# Patient Record
Sex: Male | Born: 1955
Health system: Southern US, Community
[De-identification: ages and names within clinical notes are randomized; demographics above are authoritative.]

## PROBLEM LIST (undated history)

## (undated) DIAGNOSIS — E119 Type 2 diabetes mellitus without complications: Secondary | ICD-10-CM

## (undated) DIAGNOSIS — E785 Hyperlipidemia, unspecified: Secondary | ICD-10-CM

## (undated) DIAGNOSIS — I639 Cerebral infarction, unspecified: Secondary | ICD-10-CM

## (undated) HISTORY — DX: Hyperlipidemia, unspecified: E78.5

---

## 2007-12-19 ENCOUNTER — Ambulatory Visit: Payer: Self-pay | Admitting: Gastroenterology

## 2014-10-15 ENCOUNTER — Emergency Department: Payer: Self-pay | Admitting: Emergency Medicine

## 2014-10-15 ENCOUNTER — Encounter (HOSPITAL_COMMUNITY): Payer: Self-pay

## 2014-10-15 ENCOUNTER — Inpatient Hospital Stay (HOSPITAL_COMMUNITY): Payer: BC Managed Care – PPO

## 2014-10-15 ENCOUNTER — Inpatient Hospital Stay (HOSPITAL_COMMUNITY)
Admission: EM | Admit: 2014-10-15 | Discharge: 2014-10-18 | DRG: 064 | Disposition: A | Discharge status: Home / Self Care | Payer: BC Managed Care – PPO | Attending: Internal Medicine | Admitting: Internal Medicine

## 2014-10-15 DIAGNOSIS — I618 Other nontraumatic intracerebral hemorrhage: Secondary | ICD-10-CM | POA: Diagnosis present

## 2014-10-15 DIAGNOSIS — I69391 Dysphagia following cerebral infarction: Secondary | ICD-10-CM | POA: Diagnosis not present

## 2014-10-15 DIAGNOSIS — IMO0002 Reserved for concepts with insufficient information to code with codable children: Secondary | ICD-10-CM | POA: Diagnosis present

## 2014-10-15 DIAGNOSIS — R4789 Other speech disturbances: Secondary | ICD-10-CM | POA: Diagnosis present

## 2014-10-15 DIAGNOSIS — Z833 Family history of diabetes mellitus: Secondary | ICD-10-CM

## 2014-10-15 DIAGNOSIS — Z8249 Family history of ischemic heart disease and other diseases of the circulatory system: Secondary | ICD-10-CM

## 2014-10-15 DIAGNOSIS — Z794 Long term (current) use of insulin: Secondary | ICD-10-CM

## 2014-10-15 DIAGNOSIS — I679 Cerebrovascular disease, unspecified: Secondary | ICD-10-CM | POA: Insufficient documentation

## 2014-10-15 DIAGNOSIS — Z880 Allergy status to penicillin: Secondary | ICD-10-CM | POA: Diagnosis not present

## 2014-10-15 DIAGNOSIS — I639 Cerebral infarction, unspecified: Secondary | ICD-10-CM | POA: Diagnosis present

## 2014-10-15 DIAGNOSIS — E785 Hyperlipidemia, unspecified: Secondary | ICD-10-CM | POA: Diagnosis present

## 2014-10-15 DIAGNOSIS — F1290 Cannabis use, unspecified, uncomplicated: Secondary | ICD-10-CM | POA: Diagnosis present

## 2014-10-15 DIAGNOSIS — I63412 Cerebral infarction due to embolism of left middle cerebral artery: Secondary | ICD-10-CM | POA: Diagnosis present

## 2014-10-15 DIAGNOSIS — I69320 Aphasia following cerebral infarction: Secondary | ICD-10-CM

## 2014-10-15 DIAGNOSIS — I69392 Facial weakness following cerebral infarction: Secondary | ICD-10-CM | POA: Diagnosis not present

## 2014-10-15 DIAGNOSIS — E1165 Type 2 diabetes mellitus with hyperglycemia: Secondary | ICD-10-CM | POA: Diagnosis present

## 2014-10-15 DIAGNOSIS — R1312 Dysphagia, oropharyngeal phase: Secondary | ICD-10-CM | POA: Diagnosis present

## 2014-10-15 HISTORY — DX: Cerebral infarction, unspecified: I63.9

## 2014-10-15 HISTORY — DX: Type 2 diabetes mellitus without complications: E11.9

## 2014-10-15 LAB — COMPREHENSIVE METABOLIC PANEL
ALBUMIN: 4 g/dL (ref 3.4–5.0)
ALK PHOS: 87 U/L
ALT: 21 U/L (ref 0–53)
AST: 27 U/L (ref 15–37)
AST: 21 U/L (ref 0–37)
Albumin: 3.8 g/dL (ref 3.5–5.2)
Alkaline Phosphatase: 71 U/L (ref 39–117)
Anion Gap: 11 (ref 7–16)
Anion gap: 13 (ref 5–15)
BILIRUBIN TOTAL: 0.5 mg/dL (ref 0.3–1.2)
BUN: 13 mg/dL (ref 7–18)
BUN: 12 mg/dL (ref 6–23)
Bilirubin,Total: 0.5 mg/dL (ref 0.2–1.0)
CHLORIDE: 104 mmol/L (ref 98–107)
CO2: 23 meq/L (ref 19–32)
Calcium, Total: 8.6 mg/dL (ref 8.5–10.1)
Calcium: 8.9 mg/dL (ref 8.4–10.5)
Chloride: 105 mEq/L (ref 96–112)
Co2: 28 mmol/L (ref 21–32)
Creatinine, Ser: 0.84 mg/dL (ref 0.50–1.35)
Creatinine: 1.12 mg/dL (ref 0.60–1.30)
EGFR (African American): >60
EGFR (Non-African Amer.): >60
GFR calc Af Amer: >90 mL/min (ref >90)
GLUCOSE: 225 mg/dL — AB (ref 65–99)
GLUCOSE: 174 mg/dL — AB (ref 70–99)
Osmolality: 292 (ref 275–301)
POTASSIUM: 4.5 mmol/L (ref 3.5–5.1)
Potassium: 4.2 mEq/L (ref 3.7–5.3)
SGPT (ALT): 34 U/L
SODIUM: 141 meq/L (ref 137–147)
Sodium: 143 mmol/L (ref 136–145)
Total Protein: 7.9 g/dL (ref 6.4–8.2)
Total Protein: 7.1 g/dL (ref 6.0–8.3)

## 2014-10-15 LAB — CBC WITH DIFFERENTIAL/PLATELET
BASOS ABS: 0 10*3/uL (ref 0.0–0.1)
BASOS PCT: 0.5 %
BASOS PCT: 1 % (ref 0–1)
Basophils Absolute: 0.1 10*3/uL (ref 0.0–0.1)
EOS ABS: 0 10*3/uL (ref 0.0–0.7)
EOS PCT: 1 % (ref 0–5)
Eosinophil %: 0.3 %
Eosinophils Absolute: 0.1 10*3/uL (ref 0.0–0.7)
HCT: 50 % (ref 40.0–52.0)
HCT: 44.8 % (ref 39.0–52.0)
HGB: 16.4 g/dL (ref 13.0–18.0)
Hemoglobin: 15.5 g/dL (ref 13.0–17.0)
LYMPHS ABS: 1.7 10*3/uL (ref 0.7–4.0)
LYMPHS PCT: 13 %
Lymphocyte #: 1.1 10*3/uL (ref 1.0–3.6)
Lymphocytes Relative: 20 % (ref 12–46)
MCH: 30.3 pg (ref 26.0–34.0)
MCH: 30.3 pg (ref 26.0–34.0)
MCHC: 32.8 g/dL (ref 32.0–36.0)
MCHC: 34.6 g/dL (ref 30.0–36.0)
MCV: 93 fL (ref 80–100)
MCV: 87.7 fL (ref 78.0–100.0)
MONO ABS: 0.7 x10 3/mm (ref 0.2–1.0)
MONOS PCT: 8 %
Monocytes Absolute: 0.8 10*3/uL (ref 0.1–1.0)
Monocytes Relative: 10 % (ref 3–12)
NEUTROS ABS: 6.6 10*3/uL — AB (ref 1.4–6.5)
Neutro Abs: 6 10*3/uL (ref 1.7–7.7)
Neutrophil %: 78.2 %
Neutrophils Relative %: 68 % (ref 43–77)
PLATELETS: 249 10*3/uL (ref 150–440)
PLATELETS: 243 10*3/uL (ref 150–400)
RBC: 5.4 10*6/uL (ref 4.40–5.90)
RBC: 5.11 MIL/uL (ref 4.22–5.81)
RDW: 13.2 % (ref 11.5–14.5)
RDW: 12.8 % (ref 11.5–15.5)
WBC: 8.5 10*3/uL (ref 3.8–10.6)
WBC: 8.6 10*3/uL (ref 4.0–10.5)

## 2014-10-15 LAB — GLUCOSE, CAPILLARY: Glucose-Capillary: 176 mg/dL — ABNORMAL HIGH (ref 70–99)

## 2014-10-15 LAB — LIPID PANEL
Cholesterol: 208 mg/dL — ABNORMAL HIGH (ref 0–200)
HDL: 83 mg/dL (ref >39)
LDL CALC: 115 mg/dL — AB (ref 0–99)
TRIGLYCERIDES: 52 mg/dL (ref <150)
Total CHOL/HDL Ratio: 2.5 RATIO
VLDL: 10 mg/dL (ref 0–40)

## 2014-10-15 LAB — APTT: Activated PTT: 26.1 secs (ref 23.6–35.9)

## 2014-10-15 LAB — TROPONIN I: Troponin-I: <0.02 ng/mL

## 2014-10-15 LAB — PROTIME-INR
INR: 0.9
Prothrombin Time: 12.1 secs (ref 11.5–14.7)

## 2014-10-15 LAB — TSH: TSH: 6.21 u[IU]/mL — AB (ref 0.350–4.500)

## 2014-10-15 NOTE — ED Notes (Signed)
Dr. Kakrakandy at bedside. 

## 2014-10-15 NOTE — ED Notes (Signed)
Attempted to call report

## 2014-10-15 NOTE — ED Notes (Signed)
Family updated on pt plan of care, Dr. Jeraldine LootsLockwood to see pt and start working on admission orders.

## 2014-10-15 NOTE — H&P (Signed)
[facility] History and Physical  Maxwell Clark ZOX:[id number] DOB: 05/24/1956 DOA: 10/15/2014  Referring physician: ER physician. PCP: No primary care provider on file.   Chief Complaint: Difficulty speaking and right facial droop.  HPI: Maxwell Clark is a 58 y.o. male with history of diabetes mellitus type 2 on insulin started experiencing some difficulty speaking since morning. As the day progressed patient's symptoms got worse and by evening patient was taken to [facility] over there CT head done showed evolving stroke in the left parietal region and patient was transferred [facility] for further management. On-call neurologist Dr. Noel Christmasharles Stewart was consulted at this time neurologist felt that patient was not a candidate for TPA. On exam patient is having right facial droop and difficulty to speak. Patient has expressive aphasia. Patient's tongue is deviated to the right. Patient otherwise able to move all extremities 5 x 5. Denies any chest pain shortness of breath headache visual symptoms. Patient states his blood sugar has been running well. Labs done at [facility] was largely unremarkable except the blood sugar was 225.   Review of Systems: As presented in the history of presenting illness, rest negative.  Past Medical History  Diagnosis Date  . Diabetes mellitus without complication    History reviewed. No pertinent past surgical history. Social History:  reports that he has never smoked. He does not have any smokeless tobacco history on file. He reports that he drinks alcohol. He reports that he does not use illicit drugs. Where does patient live home. Can patient participate in ADLs? Yes.  Allergies  Allergen Reactions  . Penicillins     unknown    Family History:  Family History  Problem Relation Age of Onset  . Atrial fibrillation Mother   . Hypertension Mother   . Lung cancer Father   . Kidney cancer  Father       Prior to Admission medications   Medication Sig Start Date End Date Taking? Authorizing Provider  insulin glargine (LANTUS) 100 UNIT/ML injection Inject 20 Units into the skin at bedtime.   Yes Historical Provider, MD    Physical Exam: Filed Vitals:   10/15/14 2045 10/15/14 2112 10/15/14 2115 10/15/14 2245  BP: 116/74 126/64 133/63 142/77  Pulse: 66 66 66 67  Temp:    98.1 F (36.7 C)  Resp: 13 15 15 17   SpO2: 96% 98% 98% 98%     General:  Well-developed and nourished.  Eyes: Anicteric no pallor.  ENT: No discharge from the ears eyes nose and mouth. Right facial droop.  Neck: No mass felt. No neck rigidity.  Cardiovascular: S1 and S2 heard.  Respiratory: No rhonchi or crepitations.  Abdomen: Soft nontender bowel sounds present.  Skin: No rash.  Musculoskeletal: No edema.  Psychiatric: Appears normal.  Neurologic: Alert awake oriented to time place and person. Moves all extremities 5 x 5. Right facial droop. Tongue is deviated to the right. Perla positive.  Labs on Admission:  Basic Metabolic Panel: No results for input(s): NA, K, CL, CO2, GLUCOSE, BUN, CREATININE, CALCIUM, MG, PHOS in the last 168 hours. Liver Function Tests: No results for input(s): AST, ALT, ALKPHOS, BILITOT, PROT, ALBUMIN in the last 168 hours. No results for input(s): LIPASE, AMYLASE in the last 168 hours. No results for input(s): AMMONIA in the last 168 hours. CBC:  Recent Labs Lab 10/15/14 2226  WBC 8.6  NEUTROABS 6.0  HGB 15.5  HCT 44.8  MCV 87.7  PLT 243   Cardiac Enzymes: No results for input(s): CKTOTAL, CKMB, CKMBINDEX, TROPONINI in the last 168 hours.  BNP (last 3 results) No results for input(s): PROBNP in the last 8760 hours. CBG: No results for input(s): GLUCAP in the last 168 hours.  Radiological Exams on Admission: No results found.  EKG: Independently reviewed. Normal sinus rhythm.  Assessment/Plan Principal Problem:   Acute ischemic  stroke Active Problems:   Aphasia due to stroke   Stroke   Diabetes mellitus type 2, uncontrolled   1. Acute ischemic stroke - at this time neurologist Dr. Noel Christmasharles Stewart as recommended to get MRI/MRA brain 2-D echo and carotid Doppler. Patient will be placed on neuro checks. Patient has failed swallow evaluation. Get speech therapist evaluation. Aspirin. Closely monitor in telemetry. 2. Diabetes mellitus type 2 - since patient has failed swallow I have decrease patient's Lantus dose by half now. Closely follow CBGs by sliding scale. Check hemoglobin A1c and lipid panel.    Code Status: Full code.  Family Communication: Patient's wife at the bedside.  Disposition Plan: Admit to inpatient.    KAKRAKANDY,ARSHAD N. [facility] Pager [phone].  If 7PM-7AM, please contact night-coverage [email or web address] Password [email or web address] 10/15/2014, 11:13 PM

## 2014-10-15 NOTE — ED Notes (Signed)
[facility]- Pt coming from [facility], he was first noted to have trouble talking around 0700 this morning. Pt then proceeded to go to work and continued to worsen. Pt diagnosed with left parietal lobe infarct at [facility]. Pt has right side facial droop, moves all extremities equally. Is having expressive difficulties at this time. NAD noted.

## 2014-10-15 NOTE — ED Notes (Signed)
Dr Stewart at bedside.

## 2014-10-15 NOTE — Consult Note (Signed)
Referring Physician: Dr. Jeraldine LootsLockwood  Chief Complaint: New onset expressive aphasia.  HPI: Maxwell Clark is an 58 y.o. male history of diabetes this noticed his inability to speak 7 AM this morning. It's unclear patient woke up with speech deficit. He was noted to be mute this afternoon he was beyond time window for and was brought to the emergency room for further evaluation and [facility]. CT scan of his mute his head showed an area of likely evolving stroke involving the left frontal region. Patient had no difficulty with understanding what was being said to him and following commands, but was mute. Patient was past the time window for treatment with TPA. NIH stroke scale was 9.  LSN: 7 AM on 10/15/2014 tPA Given: No: Beyond time window for treatment mRankin:  Past Medical History  Diagnosis Date  . Diabetes mellitus without complication     Family history: Positive for diabetes mellitus; negative for stroke and hypertension.  Medications: I have reviewed the patient's current medications.  ROS: History obtained from the patient  General ROS: negative for - chills, fatigue, fever, night sweats, weight gain or weight loss Psychological ROS: negative for - behavioral disorder, hallucinations, memory difficulties, mood swings or suicidal ideation Ophthalmic ROS: negative for - blurry vision, double vision, eye pain or loss of vision ENT ROS: negative for - epistaxis, nasal discharge, oral lesions, sore throat, tinnitus or vertigo Allergy and Immunology ROS: negative for - hives or itchy/watery eyes Hematological and Lymphatic ROS: negative for - bleeding problems, bruising or swollen lymph nodes Endocrine ROS: negative for - galactorrhea, hair pattern changes, polydipsia/polyuria or temperature intolerance Respiratory ROS: negative for - cough, hemoptysis, shortness of breath or wheezing Cardiovascular ROS: negative for - chest pain, dyspnea on exertion, edema or irregular  heartbeat Gastrointestinal ROS: negative for - abdominal pain, diarrhea, hematemesis, nausea/vomiting or stool incontinence Genito-Urinary ROS: negative for - dysuria, hematuria, incontinence or urinary frequency/urgency Musculoskeletal ROS: negative for - joint swelling or muscular weakness Neurological ROS: as noted in HPI Dermatological ROS: negative for rash and skin lesion changes  Physical Examination: Blood pressure 126/64, pulse 66, resp. rate 15, SpO2 98 %.   HEENT: Normal. Neck was supple.  Chest was clear to auscultation Heart rate and rhythm with normal; normal S1 and S2; no murmur. Abdomen was normal.  Neurologic Examination: Mental Status: Alert, totally mute, in no acute distress. Able to follow commands without difficulty. Cranial Nerves: II-Visual fields were normal. III/IV/VI-Pupils were equal and reacted. Extraocular movements were full and conjugate.    V/VII-no facial numbness; moderate right lower facial weakness. VIII-normal. X-mute. Motor: 5/5 bilaterally with normal tone and bulk Sensory: Normal throughout. Deep Tendon Reflexes: 1+ and symmetric. Plantars: Flexor bilaterally Cerebellar: Normal finger-to-nose testing. Carotid auscultation: Normal  No results found.  Assessment: 58 y.o. male with a history of diabetes mellitus presenting with acute left frontal ischemic infarction with associated severe expressive aphasia.  Stroke Risk Factors - diabetes mellitus  Plan: 1. HgbA1c, fasting lipid panel 2. MRI, MRA  of the brain without contrast 3. PT consult, OT consult, Speech consult 4. Echocardiogram 5. Carotid dopplers 6. Prophylactic therapy-Antiplatelet med: Aspirin  7. Risk factor modification 8. Telemetry monitoring   C.R. Roseanne RenoStewart, MD [facility] [phone]  10/15/2014, 9:30 PM

## 2014-10-16 ENCOUNTER — Inpatient Hospital Stay (HOSPITAL_COMMUNITY): Payer: BC Managed Care – PPO

## 2014-10-16 ENCOUNTER — Encounter (HOSPITAL_COMMUNITY): Payer: Self-pay | Admitting: General Practice

## 2014-10-16 DIAGNOSIS — I519 Heart disease, unspecified: Secondary | ICD-10-CM

## 2014-10-16 LAB — GLUCOSE, CAPILLARY
GLUCOSE-CAPILLARY: 167 mg/dL — AB (ref 70–99)
Glucose-Capillary: 100 mg/dL — ABNORMAL HIGH (ref 70–99)
Glucose-Capillary: 107 mg/dL — ABNORMAL HIGH (ref 70–99)
Glucose-Capillary: 135 mg/dL — ABNORMAL HIGH (ref 70–99)
Glucose-Capillary: 161 mg/dL — ABNORMAL HIGH (ref 70–99)
Glucose-Capillary: 98 mg/dL (ref 70–99)

## 2014-10-16 LAB — URINALYSIS, ROUTINE W REFLEX MICROSCOPIC
BILIRUBIN URINE: NEGATIVE
Glucose, UA: >1000 mg/dL — AB
Hgb urine dipstick: NEGATIVE
KETONES UR: 15 mg/dL — AB
Leukocytes, UA: NEGATIVE
NITRITE: NEGATIVE
Protein, ur: NEGATIVE mg/dL
Specific Gravity, Urine: 1.036 — ABNORMAL HIGH (ref 1.005–1.030)
Urobilinogen, UA: 0.2 mg/dL (ref 0.0–1.0)
pH: 6 (ref 5.0–8.0)

## 2014-10-16 LAB — HEMOGLOBIN A1C
Hgb A1c MFr Bld: 8.1 % — ABNORMAL HIGH (ref <5.7)
Mean Plasma Glucose: 186 mg/dL — ABNORMAL HIGH (ref <117)

## 2014-10-16 LAB — RAPID URINE DRUG SCREEN, HOSP PERFORMED
Amphetamines: NOT DETECTED
BARBITURATES: NOT DETECTED
Benzodiazepines: NOT DETECTED
COCAINE: NOT DETECTED
Opiates: NOT DETECTED
Tetrahydrocannabinol: POSITIVE — AB

## 2014-10-16 LAB — ANTITHROMBIN III: ANTITHROMB III FUNC: 114 % (ref 75–120)

## 2014-10-16 LAB — URINE MICROSCOPIC-ADD ON

## 2014-10-16 MED ORDER — SENNOSIDES-DOCUSATE SODIUM 8.6-50 MG PO TABS: 1.0000 | ORAL_TABLET | Freq: Every evening | ORAL | Status: DC | PRN

## 2014-10-16 MED ORDER — ASPIRIN 325 MG PO TABS
325.0000 mg | ORAL_TABLET | Freq: Every day | ORAL | Status: DC
Start: 2014-10-16 — End: 2014-10-16

## 2014-10-16 MED ORDER — SODIUM CHLORIDE 0.9 % IV SOLN
INTRAVENOUS | Status: AC
  Administered 2014-10-16: 02:00:00 via INTRAVENOUS

## 2014-10-16 MED ORDER — ATORVASTATIN CALCIUM 80 MG PO TABS
80.0000 mg | ORAL_TABLET | Freq: Every day | ORAL | Status: DC
Start: 2014-10-16 — End: 2014-10-18
  Administered 2014-10-16 – 2014-10-18 (×3): 80 mg via ORAL
  Filled 2014-10-16 (×3): qty 1

## 2014-10-16 MED ORDER — INSULIN GLARGINE 100 UNIT/ML ~~LOC~~ SOLN
10.0000 [IU] | Freq: Every day | SUBCUTANEOUS | Status: DC
  Administered 2014-10-16 (×2): 10 [IU] via SUBCUTANEOUS
  Administered 2014-10-17: 5 [IU] via SUBCUTANEOUS
  Filled 2014-10-16 (×4): qty 0.1

## 2014-10-16 MED ORDER — ATORVASTATIN CALCIUM 20 MG PO TABS
20.0000 mg | ORAL_TABLET | Freq: Every day | ORAL | Status: DC
Start: 2014-10-16 — End: 2014-10-16

## 2014-10-16 MED ORDER — CETYLPYRIDINIUM CHLORIDE 0.05 % MT LIQD
7.0000 mL | Freq: Two times a day (BID) | OROMUCOSAL | Status: DC
  Administered 2014-10-16: 7 mL via OROMUCOSAL

## 2014-10-16 MED ORDER — ENOXAPARIN SODIUM 40 MG/0.4ML ~~LOC~~ SOLN
40.0000 mg | SUBCUTANEOUS | Status: DC
  Administered 2014-10-16 – 2014-10-17 (×2): 40 mg via SUBCUTANEOUS
  Filled 2014-10-16 (×2): qty 0.4

## 2014-10-16 MED ORDER — STROKE: EARLY STAGES OF RECOVERY BOOK
Freq: Once | Status: AC
  Administered 2014-10-16: 02:00:00
  Filled 2014-10-16: qty 1

## 2014-10-16 MED ORDER — INSULIN ASPART 100 UNIT/ML ~~LOC~~ SOLN
0.0000 [IU] | SUBCUTANEOUS | Status: DC
  Administered 2014-10-16: 2 [IU] via SUBCUTANEOUS
  Administered 2014-10-16: 1 [IU] via SUBCUTANEOUS
  Administered 2014-10-16 – 2014-10-17 (×3): 2 [IU] via SUBCUTANEOUS
  Administered 2014-10-18: 1 [IU] via SUBCUTANEOUS

## 2014-10-16 MED ORDER — ASPIRIN 300 MG RE SUPP: 300.0000 mg | Freq: Every day | RECTAL | Status: DC

## 2014-10-16 NOTE — Evaluation (Signed)
Physical Therapy Evaluation Patient Details Name: Maxwell Clark MRN: [id number] DOB: [birth year] Today's Date: 10/16/2014   History of Present Illness    Maxwell Clark is a 58 y.o. male with history of diabetes mellitus type 2 on insulin started experiencing some difficulty speaking since morning. As the day progressed patient's symptoms got worse and by evening patient was taken to [facility] over there CT head done showed evolving stroke in the left parietal region and patient was transferred to [facility] for further management. On-call neurologist Dr. Noel Christmasharles Stewart was consulted at this time neurologist felt that patient was not a candidate for TPA. On exam patient is having right facial droop and difficulty to speak. Patient has expressive aphasia. Patient's tongue is deviated to the right. MRI pending.  Clinical Impression   Pt is reported to be at baseline mobility level by patient and family. Pt was reluctant to work with physical therapy as he is tired and had a busy morning with tests. Pt with expressive difficulties but able to respond reliably to yes/no questions. Pt demonstrated good, safe bed mobility, transfers and ambulation. Pt is quick to move which family stated is baseline for pt, in combination with pt wanting to get session over to relax before next test. Despite being quick to move, pt was never unsafe. Pt will benefit from outpatient speech and an outpatient OT consult to assess higher cognition and RUE fine motor dexterity. Pt is safe and at baseline from a pt standpoint and will not require further acute PT or PT after discharge. No goals will be written at this time.     Follow Up Recommendations No PT follow up    Equipment Recommendations  None recommended by PT    Recommendations for Other Services       Precautions / Restrictions Precautions Precautions: Fall Restrictions Weight Bearing Restrictions: No      Mobility  Bed  Mobility Overal bed mobility: Modified Independent             General bed mobility comments: Pt abel to sit up with HOB elevated. Pt was quick to sit up so unable to lowered bed to see bed mobility with flat bed.   Transfers Overall transfer level: Needs assistance Equipment used: None Transfers: Sit to/from Stand Sit to Stand: Supervision         General transfer comment: Pt able to stand safely from sitting EOB without use of UE.   Ambulation/Gait Ambulation/Gait assistance: Supervision Ambulation Distance (Feet): 100 Feet Assistive device: None Gait Pattern/deviations: Step-through pattern   Gait velocity interpretation: at or above normal speed for age/gender General Gait Details: Pt able to safely ambulate in hallway with near normal speed for age amd gender. Pt had no episodes of loss of balance and was able to attend to objects on both side.   Stairs            Wheelchair Mobility    Modified Rankin (Stroke Patients Only)       Balance Overall balance assessment: Needs assistance Sitting-balance support: Feet supported;No upper extremity supported Sitting balance-Leahy Scale: Good     Standing balance support: During functional activity;No upper extremity supported Standing balance-Leahy Scale: Good Standing balance comment: Pt able to ambulate in hallway safely with no AD.                              Pertinent Vitals/Pain Pain Assessment: No/denies pain  VSS    Home Living Family/patient expects to be discharged to:: Private residence Living Arrangements: Spouse/significant other Available Help at Discharge: Family Type of Home: House Home Access: Level entry     Home Layout: Two level;Able to live on main level with bedroom/bathroom Home Equipment: None      Prior Function Level of Independence: Independent               Hand Dominance   Dominant Hand: Right    Extremity/Trunk Assessment                Lower Extremity Assessment: Overall WFL for tasks assessed         Communication   Communication: Expressive difficulties  Cognition Arousal/Alertness: Awake/alert Behavior During Therapy: WFL for tasks assessed/performed Overall Cognitive Status: Within Functional Limits for tasks assessed                      General Comments General comments (skin integrity, edema, etc.): Pt able to appropriately answer yes/no questions. Pt quick and a little implusive with movement but pt's family states that this is his baseline. Pt with no reported visual deficits.     Exercises        Assessment/Plan    PT Assessment Patent does not need any further PT services  PT Diagnosis Difficulty walking   PT Problem List    PT Treatment Interventions     PT Goals (Current goals can be found in the Care Plan section)      Frequency     Barriers to discharge        Co-evaluation               End of Session Equipment Utilized During Treatment: Gait belt Activity Tolerance: Patient tolerated treatment well Patient left: in bed;with call bell/phone within reach;with family/visitor present           Time: [id number] PT Time Calculation (min) (ACUTE ONLY): 15 min   Charges:   PT Evaluation $Initial PT Evaluation Tier I: 1 Procedure     PT G CodesBerline Lopes:          [name] 10/16/2014, 12:49 PM   York SpanielOlivia Hebert, SPT  Acute Rehabilitation [phone] [facility] Jonie Burdell,PT Acute Rehabilitation [phone] (pager)

## 2014-10-16 NOTE — Plan of Care (Signed)
Problem: SLP Language Goals Goal: Patient will communicate needs/wants with Outcome: Progressing     

## 2014-10-16 NOTE — Progress Notes (Signed)
Bilateral carotid artery duplex completed:  1-39% ICA stenosis.  Vertebral artery flow is antegrade.     

## 2014-10-16 NOTE — Progress Notes (Signed)
*  PRELIMINARY RESULTS* Echocardiogram 2D Echocardiogram has been performed.  Jeryl ColumbiaLLIOTT, Glorie Dowlen 10/16/2014, 10:40 AM

## 2014-10-16 NOTE — ED Provider Notes (Signed)
CSN: [id number]     Arrival date & time 10/15/14  1840 History   First MD Initiated Contact with Patient 10/15/14 1848     Chief Complaint  Patient presents with  . Cerebrovascular Accident     HPI  Patient presents on transfer from one of our affiliated facilities due to concern for stroke. I see normal time was approximately 12 hours prior to my evaluation. Symptoms consist of expressive aphasia, initially with facial droop. Subsequent, the facial droop resolved, but the aphasia persists. The patient nods in response to questions. He currently denies pain, confusion, disorientation. Patient was generally well prior to the episode.  Patient was transferred here after physicians at the other facility discussed this case with neurology, and thought that he may be a candidate for intervention.   Past Medical History  Diagnosis Date  . Diabetes mellitus without complication    History reviewed. No pertinent past surgical history. Family History  Problem Relation Age of Onset  . Atrial fibrillation Mother   . Hypertension Mother   . Lung cancer Father   . Kidney cancer Father    History  Substance Use Topics  . Smoking status: Never Smoker   . Smokeless tobacco: Not on file  . Alcohol Use: Yes     Comment: Occasional     Review of Systems  Constitutional:       Per HPI, otherwise negative  HENT:       Per HPI, otherwise negative  Respiratory:       Per HPI, otherwise negative  Cardiovascular:       Per HPI, otherwise negative  Gastrointestinal: Negative for vomiting.  Endocrine:       Negative aside from HPI  Genitourinary:       Neg aside from HPI   Musculoskeletal:       Per HPI, otherwise negative  Skin: Negative.   Neurological: Positive for speech difficulty. Negative for syncope.      Allergies  Penicillins  Home Medications   Prior to Admission medications   Medication Sig Start Date End Date Taking? Authorizing Provider  insulin glargine  (LANTUS) 100 UNIT/ML injection Inject 20 Units into the skin at bedtime.   Yes Historical Provider, MD   BP 143/78 mmHg  Pulse 62  Temp(Src) 99.2 F (37.3 C) (Oral)  Resp 18  Ht 5\' 10"  (1.778 m)  Wt 189 lb 6.4 oz (85.911 kg)  BMI 27.18 kg/m2  SpO2 92% Physical Exam  Constitutional: He is oriented to person, place, and time. He appears well-developed. No distress.  HENT:  Head: Normocephalic and atraumatic.  Eyes: Conjunctivae and EOM are normal.  Cardiovascular: Normal rate and regular rhythm.   Pulmonary/Chest: Effort normal. No stridor. No respiratory distress.  Abdominal: He exhibits no distension.  Musculoskeletal: He exhibits no edema.  Neurological: He is alert and oriented to person, place, and time. A cranial nerve deficit is present. He exhibits normal muscle tone. Coordination normal.  Moves all extremity spontaneously, has appropriate strength in all 4 extremities.  Speech is absent, though the patient seems to acknowledge questions, commands appropriately.   Skin: Skin is warm and dry.  Psychiatric: He has a normal mood and affect.  Nursing note and vitals reviewed.   ED Course  Procedures (including critical care time) Labs Review Labs Reviewed  COMPREHENSIVE METABOLIC PANEL - Abnormal; Notable for the following:    Glucose, Bld 174 (*)    All other components within normal limits  TSH - Abnormal; Notable  for the following:    TSH 6.210 (*)    All other components within normal limits  LIPID PANEL - Abnormal; Notable for the following:    Cholesterol 208 (*)    LDL Cholesterol 115 (*)    All other components within normal limits  GLUCOSE, CAPILLARY - Abnormal; Notable for the following:    Glucose-Capillary 176 (*)    All other components within normal limits  CBC WITH DIFFERENTIAL  TROPONIN I  HEMOGLOBIN A1C  URINALYSIS, ROUTINE W REFLEX MICROSCOPIC  URINE RAPID DRUG SCREEN (HOSP PERFORMED)    Imaging Review Dg Chest Port 1 View  10/15/2014    CLINICAL DATA:  Stroke, nonverbal.  EXAM: PORTABLE CHEST - 1 VIEW  COMPARISON:  None.  FINDINGS: The heart size and mediastinal contours are within normal limits. Both lungs are clear. The visualized skeletal structures are unremarkable.  IMPRESSION: No active disease.   Electronically Signed   By: Awilda Metroourtnay  Bloomer   On: 10/15/2014 23:14   I reviewed the labs, imaging, EKG from the other facility. Patient had EKG with normal sinus rhythm, rate 72, unremarkable After the initial evaluation and discuss his case with neurology, who evaluated the patient, and the patient does not seem to be a candidate for emergent intervention.   MDM   Final diagnoses:  Acute ischemic stroke  Aphasia due to stroke    Patient presents with acute stroke.  Patient's presentation was well beyond the window for acute intervention, and though the patient is aphasic, he has a relatively low NIH score. Given the patient's absence of prior neurologic evaluation, he was admitted for further evaluation and management.    Gerhard Munchobert Ariellah Faust, MD 10/16/14 [phone]

## 2014-10-16 NOTE — Care Management Note (Unsigned)
    Page 1 of 1   10/16/2014     4:55:12 PM CARE MANAGEMENT NOTE 10/16/2014  Patient:  Maxwell Clark,Maxwell Clark   Account Number:  [id number]  Date Initiated:  10/16/2014  Documentation initiated by:  GRAVES-BIGELOW,Jamelle Noy  Subjective/Objective Assessment:   Pt admitted for Acute hemorrhagic infarct in the left frontal operculum and left insular cortex.     Action/Plan:   CM will monitor for disposition needs.   Anticipated DC Date:  10/18/2014   Anticipated DC Plan:  HOME W HOME HEALTH SERVICES      DC Planning Services  CM consult      Choice offered to / List presented to:             Status of service:  In process, will continue to follow Medicare Important Message given?   (If response is "NO", the following Medicare IM given date fields will be blank) Date Medicare IM given:   Medicare IM given by:   Date Additional Medicare IM given:   Additional Medicare IM given by:    Discharge Disposition:    Per UR Regulation:  Reviewed for med. necessity/level of care/duration of stay  If discussed at Long Length of Stay Meetings, dates discussed:    Comments:  10-16-14 1653 Tomi BambergerBrenda graves-Bigelow, RN,BSN [phone] CM did place an Epic Referral for outpatient [facility]. CM will continue to monitor for additional needs.

## 2014-10-16 NOTE — Progress Notes (Signed)
[facility] PROGRESS NOTE  Maxwell Clark WUJ:[id number] DOB: May 03, 1956 DOA: 10/15/2014 PCP: Elizabeth Sauer, MD  Brief narrative 58 year old male with type 2 diabetes mellitus on insulin presented with acute onset of difficulty speaking on the morning of admission. As the day progressed his symptoms became worse and was taken to [facility] ED. Head CT done over there showed evolving stroke in the left parietal area and patient was transferred to [facility] for further management. Patient was out of therapeutic window for TPA so did not receive TPA. Patient on exam had right facial droop and was unable to speak severe expressive aphagia and deviation of the tongue to the right. Patient had good strength in all his extremities. Admitted to telemetry for stroke workup.    Assessment/Plan: Acute ischemic/embolic stroke MRI of the brain showing. Acute hemorrhagic infarct in the left frontal operculum extending into the insular cortex. Discussed with neurology appears to have embolic stroke with hemorrhagic transformation. Avoid antiplatelets. Seen by speech and swallow therapy. Patient still unable to speak but has cleared swallowing evaluation. MBS done and results pending -Elevated LDL noted and started on Lipitor 80 mg daily. -Allow permissive blood pressure. -Given concern for embolic stroke requested for TEE, which will possibly be on 11/27. Ordered hypercoagulable panel. -Carotid Doppler without significant carotid stenosis. 2-D echo pending -Appreciate neurology recommendations  Type 2 diabetes mellitus, uncontrolled A1c of 8.1. Resume home dose Lantus and sliding scale insulin. Needs counseling on weight loss and exercise.  Cannabis use Needs counseling on cessation.    Diet: Mechanical soft  DVT prophylaxis: sq lovenox   Code Status: Full code Family Communication: Wife at bedside Disposition Plan: Home once workup  completed   Consultants:  Neurology   cardiology for TEE  Procedures: MRI brain Carotid Doppler 2-D echo   Antibiotics:  NONE  HPI/Subjective: Seen and examined . Admission H&P reviewed  Objective: Filed Vitals:   10/16/14 0738  BP:   Pulse:   Temp: 98.8 F (37.1 C)  Resp:    No intake or output data in the 24 hours ending 10/16/14 1406 Filed Weights   10/15/14 2340  Weight: 85.911 kg (189 lb 6.4 oz)    Exam:   General:  NAD  HEENT: right facial droop,   Cardiovascular: NS1&S2, no murmurs  Respiratory: clear b/l, no added sounds  Abdomen: soft, NT ND BS+  Musculoskeletal: warm, no edema  CNS: rt facial droop with tongue deviation, aphasic, pupils reactive b/l, normal power and tone b/l  Data Reviewed: Basic Metabolic Panel:  Recent Labs Lab 10/15/14 2226  NA 141  K 4.2  CL 105  CO2 23  GLUCOSE 174*  BUN 12  CREATININE 0.84  CALCIUM 8.9   Liver Function Tests:  Recent Labs Lab 10/15/14 2226  AST 21  ALT 21  ALKPHOS 71  BILITOT 0.5  PROT 7.1  ALBUMIN 3.8   No results for input(s): LIPASE, AMYLASE in the last 168 hours. No results for input(s): AMMONIA in the last 168 hours. CBC:  Recent Labs Lab 10/15/14 2226  WBC 8.6  NEUTROABS 6.0  HGB 15.5  HCT 44.8  MCV 87.7  PLT 243   Cardiac Enzymes:  Recent Labs Lab 10/15/14 2226  TROPONINI <0.30   BNP (last 3 results) No results for input(s): PROBNP in the last 8760 hours. CBG:  Recent Labs Lab 10/15/14 2356 10/16/14 0402 10/16/14 0736 10/16/14 1134  GLUCAP 176* 167* 100* 161*    No results found for this or  any previous visit (from the past 240 hour(s)).   Studies: Mr Brain Wo Contrast  10/16/2014   CLINICAL DATA:  Stroke.  Diabetes.  Expressive aphasia.  EXAM: MRI HEAD WITHOUT CONTRAST  MRA HEAD WITHOUT CONTRAST  TECHNIQUE: Multiplanar, multiecho pulse sequences of the brain and surrounding structures were obtained without intravenous contrast. Angiographic  images of the head were obtained using MRA technique without contrast.  COMPARISON:  CT head 10/15/2014  FINDINGS: MRI HEAD FINDINGS  Acute hemorrhagic infarct in the left frontal operculum extending into the insular cortex. Infarct measures approximately 3 x 5 cm. No other acute infarct identified  Ventricle size is normal. No shift of the midline structures. No significant chronic ischemic change. Cerebral white matter and brainstem are normal.  Negative for mass lesion.  MRA HEAD FINDINGS  Both vertebral arteries are patent to the basilar. PICA patent bilaterally. Basilar widely patent. Superior cerebellar arteries are patent bilaterally. Fetal origin of the posterior cerebral artery bilaterally with hypoplastic P1 segments bilaterally.  Internal carotid artery is patent bilaterally without significant stenosis.  Right anterior and right middle cerebral arteries are widely patent without stenosis or occlusion.  Left anterior cerebral artery widely patent.  Left M1 segment widely patent. There is a stenosis at the left M1 bifurcation. Mild to moderate stenosis in the temporal branch of the left middle cerebral artery.  IMPRESSION: Acute hemorrhagic infarct in the left frontal operculum and left insular cortex. No other significant ischemic change.  Mild to moderate stenosis of the left MCA bifurcation extending into the temporal branch of the left MCA. This is less severe than would be expected for acute infarct and could represent some residual thrombus. Alternately, an embolus could be the cause of this infarction.   Electronically Signed   By: Marlan Palauharles  Clark M.D.   On: 10/16/2014 09:54   Dg Chest Port 1 View  10/15/2014   CLINICAL DATA:  Stroke, nonverbal.  EXAM: PORTABLE CHEST - 1 VIEW  COMPARISON:  None.  FINDINGS: The heart size and mediastinal contours are within normal limits. Both lungs are clear. The visualized skeletal structures are unremarkable.  IMPRESSION: No active disease.   Electronically  Signed   By: Awilda Metroourtnay  Bloomer   On: 10/15/2014 23:14   Mr Maxine GlennMra Head/brain Wo Cm  10/16/2014   CLINICAL DATA:  Stroke.  Diabetes.  Expressive aphasia.  EXAM: MRI HEAD WITHOUT CONTRAST  MRA HEAD WITHOUT CONTRAST  TECHNIQUE: Multiplanar, multiecho pulse sequences of the brain and surrounding structures were obtained without intravenous contrast. Angiographic images of the head were obtained using MRA technique without contrast.  COMPARISON:  CT head 10/15/2014  FINDINGS: MRI HEAD FINDINGS  Acute hemorrhagic infarct in the left frontal operculum extending into the insular cortex. Infarct measures approximately 3 x 5 cm. No other acute infarct identified  Ventricle size is normal. No shift of the midline structures. No significant chronic ischemic change. Cerebral white matter and brainstem are normal.  Negative for mass lesion.  MRA HEAD FINDINGS  Both vertebral arteries are patent to the basilar. PICA patent bilaterally. Basilar widely patent. Superior cerebellar arteries are patent bilaterally. Fetal origin of the posterior cerebral artery bilaterally with hypoplastic P1 segments bilaterally.  Internal carotid artery is patent bilaterally without significant stenosis.  Right anterior and right middle cerebral arteries are widely patent without stenosis or occlusion.  Left anterior cerebral artery widely patent.  Left M1 segment widely patent. There is a stenosis at the left M1 bifurcation. Mild to moderate stenosis in the  temporal branch of the left middle cerebral artery.  IMPRESSION: Acute hemorrhagic infarct in the left frontal operculum and left insular cortex. No other significant ischemic change.  Mild to moderate stenosis of the left MCA bifurcation extending into the temporal branch of the left MCA. This is less severe than would be expected for acute infarct and could represent some residual thrombus. Alternately, an embolus could be the cause of this infarction.   Electronically Signed   By: Marlan Palauharles   Clark M.D.   On: 10/16/2014 09:54    Scheduled Meds: . antiseptic oral rinse  7 mL Mouth Rinse BID  . atorvastatin  80 mg Oral q1800  . enoxaparin (LOVENOX) injection  40 mg Subcutaneous Q24H  . insulin aspart  0-9 Units Subcutaneous Q4H  . insulin glargine  10 Units Subcutaneous QHS   Continuous Infusions: . sodium chloride 100 mL/hr at 10/16/14 0201       Time spent: 25 minutes    Eddie NorthDHUNGEL, Legacie Dillingham  [facility] Pager [phone] If 7PM-7AM, please contact night-coverage at [email or web address], password [facility]RH1 10/16/2014, 2:06 PM  LOS: 1 day

## 2014-10-16 NOTE — Evaluation (Signed)
Clinical/Bedside Swallow Evaluation Patient Details  Name: Maxwell Clark MRN: [id number] Date of Birth: [birth year]  Today's Date: 10/16/2014 Time: [id number] SLP Time Calculation (min) (ACUTE ONLY): 36 min  Past Medical History:  Past Medical History  Diagnosis Date  . Diabetes mellitus without complication   . Stroke 10/15/2014   Past Surgical History: History reviewed. No pertinent past surgical history. HPI:  Maxwell Clark is a 58 y.o. male with history of diabetes mellitus type 2 on insulin started experiencing some difficulty speaking since morning. As the day progressed patient's symptoms got worse and by evening patient was [facility] CT head done showed evolving stroke in the left parietal region and [facility]. On-call neurologist Dr. Noel Christmasharles Stewart was consulted at this time neurologist felt that patient was not a candidate for TPA. On exam patient is having right facial droop and difficulty to speak. Patient has expressive aphasia. Patient's tongue is deviated to the right.  MRI pending.    Assessment / Plan / Recommendation Clinical Impression  Pt demonstrates evidence of an oral and oropharygneal dysphagia with oral weakness and suspected decreased airway protection with thin liquids. Pt demonstrated sensed aspiration in 1 out of three trials despite cues for small sips and placement of straw in left oral cavity. Head tilt left attempted, but wet vocal quality noted after trials concerning for residuals or silent penetration. Recommend objective testing to determine best diet and use of compensatory strategies, which pt would be able to utilize given excellent mentation and arousal. Pt to remain NPO except for meds given in puree until MBS or FEES completed today.     Aspiration Risk  Moderate    Diet Recommendation NPO except meds   Medication Administration: Whole meds with  puree Postural Changes and/or Swallow Maneuvers: Seated upright 90 degrees    Other  Recommendations Recommended Consults: MBS;FEES (order written for MBS, but FEES also adequate)   Follow Up Recommendations  Outpatient SLP    Frequency and Duration min 3x week  2 weeks   Pertinent Vitals/Pain NA    SLP Swallow Goals     Swallow Study Prior Functional Status       General HPI: Maxwell Clark is a 58 y.o. male with history of diabetes mellitus type 2 on insulin started experiencing some difficulty speaking since morning. As the day progressed patient's symptoms got worse and by evening patient was taken [facility] CT head done showed evolving stroke in the left parietal region and patient was [facility]. On-call neurologist Dr. Noel Christmasharles Stewart was consulted at this time neurologist felt that patient was not a candidate for TPA. On exam patient is having right facial droop and difficulty to speak. Patient has expressive aphasia. Patient's tongue is deviated to the right.  MRI pending.  Type of Study: Bedside swallow evaluation Diet Prior to this Study: NPO Temperature Spikes Noted: No Respiratory Status: Room air History of Recent Intubation: No Behavior/Cognition: Alert;Cooperative;Pleasant mood Oral Cavity - Dentition: Adequate natural dentition Self-Feeding Abilities: Able to feed self Patient Positioning: Upright in bed Baseline Vocal Quality: Low vocal intensity Volitional Cough: Weak Volitional Swallow: Able to elicit (effortful)    Oral/Motor/Sensory Function Overall Oral Motor/Sensory Function: Impaired Labial ROM: Reduced right Labial Symmetry: Abnormal symmetry right Labial Strength: Reduced Labial Sensation: Within Functional Limits (per pt report) Lingual ROM: Reduced right Lingual Symmetry: Abnormal symmetry  right Lingual Strength: Reduced Lingual Sensation: Reduced (per  residuals) Facial ROM: Reduced right Facial Symmetry: Right droop Facial Strength: Reduced Facial Sensation: Within Functional Limits Mandible: Within Functional Limits   Ice Chips     Thin Liquid Thin Liquid: Impaired Presentation: Cup;Straw;Self Fed Oral Phase Impairments: Reduced labial seal Oral Phase Functional Implications: Right anterior spillage Pharyngeal  Phase Impairments: Wet Vocal Quality;Cough - Immediate Other Comments: No cough with head tilt left, but ? wet vocal quality    Nectar Thick Nectar Thick Liquid: Not tested   Honey Thick Honey Thick Liquid: Not tested   Puree Puree: Impaired Presentation: Self Fed;Spoon Oral Phase Impairments: Impaired anterior to posterior transit;Reduced lingual movement/coordination Oral Phase Functional Implications: Oral residue   Solid   GO    Solid: Not tested      Harlon DittyBonnie Emilio Baylock, MA CCC-SLP [phone]  Ladana Chavero, Riley NearingBonnie Caroline 10/16/2014,10:07 AM

## 2014-10-16 NOTE — Progress Notes (Signed)
UR Completed Mayerli Kirst Graves-Bigelow, RN,BSN [phone]  

## 2014-10-16 NOTE — Procedures (Signed)
Objective Swallowing Evaluation: Modified Barium Swallowing Study  Patient Details  Name: Maxwell Clark MRN: [id number] Date of Birth: 09-16-1956  Today's Date: 10/16/2014 Time: 1315-1350 SLP Time Calculation (min) (ACUTE ONLY): 35 min  Past Medical History:  Past Medical History  Diagnosis Date  . Diabetes mellitus without complication   . Stroke 10/15/2014   Past Surgical History: History reviewed. No pertinent past surgical history. HPI:  Maxwell OleaDavid G Gornick is a 58 y.o. male with history of diabetes mellitus type 2 on insulin started experiencing some difficulty speaking since morning. As the day progressed patient's symptoms got worse and by evening patient was [facility] CT head done showed evolving stroke in the left parietal region and patient was [facility] for further management. On-call neurologist Dr. Noel Christmasharles Stewart was consulted at this time neurologist felt that patient was not a candidate for TPA. On exam patient is having right facial droop and difficulty to speak. Patient has expressive aphasia. Patient's tongue is deviated to the right.  MRI Acute hemorrhagic infarct in the left frontal operculum and left insular cortex.     Assessment / Plan / Recommendation Clinical Impression  Clinical impression: Pt presents with a mild oropharyngeal dysphagia characterized by: 1) neurosensory impairments leading to poor oral containment with anterior and posterior bolus loss; 2) trace, silent aspiration of thin liquids when consumed in large quantities.  When pt consumes limited, small sips of liquid, he is able to protect his airway.  A head turn to the left was also protective, but no more effective than limiting bolus size.  Pharyngeal clearance was excellent with no residue for any consistencies.  Spoke with pt and his family at length re: results of exam, recommendations, strategies to maintain safety.  Family had many  questions; we discussed dysphagia and aphasia, prognosis, and communication strategies.  Recommend dysphagia 3 diet with thin liquids; meds whole with puree.  SLP will continue to follow for dysphagia/aphasia.     Treatment Recommendation  Therapy as outlined in treatment plan below    Diet Recommendation Dysphagia 3 (Mechanical Soft);Thin liquid   Liquid Administration via: Cup Medication Administration: Whole meds with puree Supervision: Patient able to self feed;Full supervision/cueing for compensatory strategies (initally to ensure safety) Compensations: Slow rate;Small sips/bites;Check for pocketing Postural Changes and/or Swallow Maneuvers: Seated upright 90 degrees    Other  Recommendations Oral Care Recommendations: Oral care BID   Follow Up Recommendations  Outpatient SLP    Frequency and Duration min 3x week  2 weeks       SLP Swallow Goals     General Date of Onset: 10/15/14 HPI: Maxwell OleaDavid G Damron is a 58 y.o. male with history of diabetes mellitus type 2 on insulin started experiencing some difficulty speaking since morning. As the day progressed patient's symptoms got worse and by evening [facility] CT head done showed evolving stroke in the left parietal region and patient was transferred to [facility] for further management. On-call neurologist Dr. Noel Christmasharles Stewart was consulted at this time neurologist felt that patient was not a candidate for TPA. On exam patient is having right facial droop and difficulty to speak. Patient has expressive aphasia. Patient's tongue is deviated to the right.  MRI Acute hemorrhagic infarct in the left frontal operculum and left Type of Study: Modified Barium Swallowing Study Reason for Referral: Objectively evaluate swallowing function Diet Prior to this Study: NPO Temperature Spikes  Noted: No Respiratory Status: Room air History of Recent Intubation: No Behavior/Cognition:  Alert;Cooperative;Pleasant mood Oral Cavity - Dentition: Adequate natural dentition Oral Motor / Sensory Function: Impaired - see Bedside swallow eval Self-Feeding Abilities: Able to feed self Patient Positioning: Upright in chair Baseline Vocal Quality: Low vocal intensity Volitional Cough: Weak Volitional Swallow: Able to elicit Anatomy: Within functional limits Pharyngeal Secretions: Not observed secondary MBS    Reason for Referral Objectively evaluate swallowing function   Oral Phase Oral Preparation/Oral Phase Oral Phase: Impaired Oral - Nectar Oral - Nectar Cup: Right anterior bolus loss;Piecemeal swallowing Oral - Thin Oral - Thin Cup: Right anterior bolus loss;Piecemeal swallowing Oral - Solids Oral - Puree: Weak lingual manipulation Oral - Mechanical Soft: Weak lingual manipulation Oral Phase - Comment Oral Phase - Comment: oral loss of liquids right side   Pharyngeal Phase Pharyngeal Phase Pharyngeal Phase: Impaired Pharyngeal - Nectar Pharyngeal - Nectar Cup: Premature spillage to pyriform sinuses Pharyngeal - Thin Pharyngeal - Thin Cup: Premature spillage to pyriform sinuses;Compensatory strategies attempted (Comment);Trace aspiration;Penetration/Aspiration during swallow (head turn to left as effective as limiting bolus size) Penetration/Aspiration details (thin cup): Material enters airway, passes BELOW cords without attempt by patient to eject out (silent aspiration) Pharyngeal - Solids Pharyngeal - Puree: Premature spillage to valleculae Pharyngeal - Regular: Premature spillage to valleculae  Cervical Esophageal Phase   Exa Bomba L. Samson Fredericouture, [place]MA CCC/SLP Pager [phone]               Blenda MountsCouture, Sheree Lalla Laurice 10/16/2014, 2:45 PM

## 2014-10-16 NOTE — Evaluation (Signed)
Occupational Therapy Evaluation Patient Details Name: Maxwell Clark MRN: [id number] DOB: 1956-03-16 Today's Date: 10/16/2014    History of Present Illness 58 y.o. with MRI revealing acute hemorrhagic infarct in the left frontal operculum and left insular cortex.   Clinical Impression   Pt admitted with above. Education provided to pt and family. All further OT needs can be met in next venue of care.     Follow Up Recommendations  Outpatient OT;Supervision - Intermittent    Equipment Recommendations  None recommended by OT    Recommendations for Other Services       Precautions / Restrictions Precautions Precautions: Fall Restrictions Weight Bearing Restrictions: No      Mobility Bed Mobility Overal bed mobility: Modified Independent                Transfers Overall transfer level: Modified independent                    Balance                                            ADL Overall ADL's : Needs assistance/impaired                     Lower Body Dressing: Sit to/from stand;Supervision/safety   Toilet Transfer: Supervision/safety;Ambulation (bed)           Functional mobility during ADLs: Supervision/safety General ADL Comments: Educated on BE FAST stroke education and importance of getting help right away. Talked about having pt find something relaxing and fun to do as he has a busy job. Educated on fine motor coordination activities and encouraged pt to be using Rt hand for activities. Gave stroke education handout and fine motor coordination handout. Discussed having someone stay with pt and discussed d/c recommendations. Recommended avoiding canned foods due to increased sodium and suggested frozen or fresh veggies. Talked about activities pt could do to help with visual saccades.     Vision   Pt reported no visual changes  Visual Fields: No apparent deficits  Visual tracking: Overall did well with this-noted  losing pen on right side on one occassion.   Saccade: Difficult (educated on activities he could do to work on this)                   Environmental education officer      Pertinent Vitals/Pain Pain Assessment: Faces Faces Pain Scale: No hurt     Hand Dominance Right   Extremity/Trunk Assessment Upper Extremity Assessment Upper Extremity Assessment: RUE deficits/detail RUE Coordination: decreased fine motor   Lower Extremity Assessment Lower Extremity Assessment: Defer to PT evaluation       Communication Communication Communication: Expressive difficulties;HOH   Cognition Arousal/Alertness: Awake/alert Behavior During Therapy: WFL for tasks assessed/performed Overall Cognitive Status: Difficult to assess due to impaired communication Area of Impairment: Problem solving;Following commands       Following Commands: Follows one step commands inconsistently     Problem Solving: Slow processing General Comments: asked pt to pick up tissue box on floor and he went to put his foot on it-could be due to [facility]. Difficulty finding picture of what OT was asking in hallway.   General Comments       Exercises Exercises: Other exercises Other Exercises Other Exercises: fine motor coordination activities/exercise.  Shoulder Instructions      Home Living Family/patient expects to be discharged to:: Private residence Living Arrangements: Spouse/significant other Available Help at Discharge: Family Type of Home: House Home Access: Level entry     Home Layout: Two level;Able to live on main level with bedroom/bathroom     Bathroom Shower/Tub: Occupational psychologist: Handicapped height     Home Equipment: None      Lives With: Spouse    Prior Functioning/Environment Level of Independence: Independent             OT Diagnosis: Disturbance of vision;Other (comment) (decreased coordination)   OT Problem List: Decreased coordination;Impaired  vision/perception   OT Treatment/Interventions:      OT Goals(Current goals can be found in the care plan section)    OT Frequency:     Barriers to D/C:            Co-evaluation              End of Session Nurse Communication: Other (comment) (IV beeping)  Activity Tolerance: Patient tolerated treatment well Patient left: in bed;with family/visitor present   Time: 3762-8315 OT Time Calculation (min): 34 min Charges:  OT General Charges $OT Visit: 1 Procedure OT Evaluation $Initial OT Evaluation Tier I: 1 Procedure OT Treatments $Therapeutic Activity: 8-22 mins G-CodesBenito Mccreedy OTR/L [phone] 10/16/2014, 5:53 PM

## 2014-10-16 NOTE — Progress Notes (Signed)
STROKE TEAM PROGRESS NOTE   HISTORY Maxwell Clark is an 58 y.o. male history of diabetes this noticed his inability to speak 7 AM this morning 10/15/2014. It's unclear patient woke up with speech deficit. He was noted to be mute this afternoon he was beyond time window for and was brought to the emergency room for further evaluation and [facility]. CT scan of his mute his head showed an area of likely evolving stroke involving the left frontal region. Patient had no difficulty with understanding what was being said to him and following commands, but was mute. Patient was past the time window for treatment with TPA. NIH stroke scale was 9. Patient was not administered TPA secondary to delay in arrival. He was admitted for further evaluation and treatment.   SUBJECTIVE (INTERVAL HISTORY) His family is at the bedside.  Overall he feels his condition is unchanged. Transport is there to take him to vascular lab.   OBJECTIVE Temp:  [98.1 F (36.7 C)-99.2 F (37.3 C)] 98.8 F (37.1 C) (11/25 0738) Pulse Rate:  [59-77] 59 (11/25 0352) Cardiac Rhythm:  [-]  Resp:  [13-19] 18 (11/25 0605) BP: (71-143)/(54-81) 107/63 mmHg (11/25 0552) SpO2:  [92 %-100 %] 97 % (11/25 0352) Weight:  [85.911 kg (189 lb 6.4 oz)] 85.911 kg (189 lb 6.4 oz) (11/24 2340)   Recent Labs Lab 10/15/14 2356 10/16/14 0402 10/16/14 0736  GLUCAP 176* 167* 100*    Recent Labs Lab 10/15/14 2226  NA 141  K 4.2  CL 105  CO2 23  GLUCOSE 174*  BUN 12  CREATININE 0.84  CALCIUM 8.9    Recent Labs Lab 10/15/14 2226  AST 21  ALT 21  ALKPHOS 71  BILITOT 0.5  PROT 7.1  ALBUMIN 3.8    Recent Labs Lab 10/15/14 2226  WBC 8.6  NEUTROABS 6.0  HGB 15.5  HCT 44.8  MCV 87.7  PLT 243    Recent Labs Lab 10/15/14 2226  TROPONINI <0.30   No results for input(s): LABPROT, INR in the last 72 hours.  Recent Labs  10/16/14 0651  COLORURINE YELLOW  LABSPEC 1.036*  PHURINE 6.0  GLUCOSEU >1000*  HGBUR NEGATIVE   BILIRUBINUR NEGATIVE  KETONESUR 15*  PROTEINUR NEGATIVE  UROBILINOGEN 0.2  NITRITE NEGATIVE  LEUKOCYTESUR NEGATIVE       Component Value Date/Time   CHOL 208* 10/15/2014 2239   TRIG 52 10/15/2014 2239   HDL 83 10/15/2014 2239   CHOLHDL 2.5 10/15/2014 2239   VLDL 10 10/15/2014 2239   LDLCALC 115* 10/15/2014 2239   Lab Results  Component Value Date   HGBA1C 8.1* 10/15/2014      Component Value Date/Time   LABOPIA NONE DETECTED 10/16/2014 0651   COCAINSCRNUR NONE DETECTED 10/16/2014 0651   LABBENZ NONE DETECTED 10/16/2014 0651   AMPHETMU NONE DETECTED 10/16/2014 0651   THCU POSITIVE* 10/16/2014 0651   LABBARB NONE DETECTED 10/16/2014 0651    No results for input(s): ETH in the last 168 hours.  Mr Brain Wo Contrast  10/16/2014   CLINICAL DATA:  Stroke.  Diabetes.  Expressive aphasia.  EXAM: MRI HEAD WITHOUT CONTRAST  MRA HEAD WITHOUT CONTRAST  TECHNIQUE: Multiplanar, multiecho pulse sequences of the brain and surrounding structures were obtained without intravenous contrast. Angiographic images of the head were obtained using MRA technique without contrast.  COMPARISON:  CT head 10/15/2014  FINDINGS: MRI HEAD FINDINGS  Acute hemorrhagic infarct in the left frontal operculum extending into the insular cortex. Infarct measures approximately 3 x  5 cm. No other acute infarct identified  Ventricle size is normal. No shift of the midline structures. No significant chronic ischemic change. Cerebral white matter and brainstem are normal.  Negative for mass lesion.  MRA HEAD FINDINGS  Both vertebral arteries are patent to the basilar. PICA patent bilaterally. Basilar widely patent. Superior cerebellar arteries are patent bilaterally. Fetal origin of the posterior cerebral artery bilaterally with hypoplastic P1 segments bilaterally.  Internal carotid artery is patent bilaterally without significant stenosis.  Right anterior and right middle cerebral arteries are widely patent without stenosis or  occlusion.  Left anterior cerebral artery widely patent.  Left M1 segment widely patent. There is a stenosis at the left M1 bifurcation. Mild to moderate stenosis in the temporal branch of the left middle cerebral artery.  IMPRESSION: Acute hemorrhagic infarct in the left frontal operculum and left insular cortex. No other significant ischemic change.  Mild to moderate stenosis of the left MCA bifurcation extending into the temporal branch of the left MCA. This is less severe than would be expected for acute infarct and could represent some residual thrombus. Alternately, an embolus could be the cause of this infarction.   Electronically Signed   By: Marlan Palauharles  Clark M.D.   On: 10/16/2014 09:54   Dg Chest Port 1 View  10/15/2014   CLINICAL DATA:  Stroke, nonverbal.  EXAM: PORTABLE CHEST - 1 VIEW  COMPARISON:  None.  FINDINGS: The heart size and mediastinal contours are within normal limits. Both lungs are clear. The visualized skeletal structures are unremarkable.  IMPRESSION: No active disease.   Electronically Signed   By: Awilda Metroourtnay  Bloomer   On: 10/15/2014 23:14   Mr Maxine GlennMra Head/brain Wo Cm  10/16/2014   CLINICAL DATA:  Stroke.  Diabetes.  Expressive aphasia.  EXAM: MRI HEAD WITHOUT CONTRAST  MRA HEAD WITHOUT CONTRAST  TECHNIQUE: Multiplanar, multiecho pulse sequences of the brain and surrounding structures were obtained without intravenous contrast. Angiographic images of the head were obtained using MRA technique without contrast.  COMPARISON:  CT head 10/15/2014  FINDINGS: MRI HEAD FINDINGS  Acute hemorrhagic infarct in the left frontal operculum extending into the insular cortex. Infarct measures approximately 3 x 5 cm. No other acute infarct identified  Ventricle size is normal. No shift of the midline structures. No significant chronic ischemic change. Cerebral white matter and brainstem are normal.  Negative for mass lesion.  MRA HEAD FINDINGS  Both vertebral arteries are patent to the basilar. PICA  patent bilaterally. Basilar widely patent. Superior cerebellar arteries are patent bilaterally. Fetal origin of the posterior cerebral artery bilaterally with hypoplastic P1 segments bilaterally.  Internal carotid artery is patent bilaterally without significant stenosis.  Right anterior and right middle cerebral arteries are widely patent without stenosis or occlusion.  Left anterior cerebral artery widely patent.  Left M1 segment widely patent. There is a stenosis at the left M1 bifurcation. Mild to moderate stenosis in the temporal branch of the left middle cerebral artery.  IMPRESSION: Acute hemorrhagic infarct in the left frontal operculum and left insular cortex. No other significant ischemic change.  Mild to moderate stenosis of the left MCA bifurcation extending into the temporal branch of the left MCA. This is less severe than would be expected for acute infarct and could represent some residual thrombus. Alternately, an embolus could be the cause of this infarction.   Electronically Signed   By: Marlan Palauharles  Clark M.D.   On: 10/16/2014 09:54   Carotid Doppler  There is 1-39% bilateral  ICA stenosis. Vertebral artery flow is antegrade.     PHYSICAL EXAM Pleasant middle aged Caucasian male not in distress.Awake alert. Afebrile. Head is nontraumatic. Neck is supple without bruit. Hearing is normal. Cardiac exam no murmur or gallop. Lungs are clear to auscultation. Distal pulses are well felt. Neurological Exam : Awake alert and appears oriented but difficult to judge given aphasia. Severe expressive aphasia and can barely mouth a few words. Mild receptive aphasia can follow simple midline and one-step commands only. Not able to name repeat. Can write his name but not sentences. Extraocular moments are full range without nystagmus. Blinks to threat bilaterally. Fundi were not visualized. Vision acuity seems adequate. Moderate right lower facial weakness. Tongue midline. Motor system exam reveals no upper or  lower extremity drift. Symmetric and equal strength in all 4 extremities. No focal weakness. Touch and pinprick sensation appear preserved bilaterally. Coordination is accurate. Reflexes are symmetric. Plantars are downgoing. Gait was not tested. ASSESSMENT/PLAN Mr. Maxwell Clark is a 58 y.o. male with history of diabetes presenting with new onset expressive aphasia. He did not receive IV t-PA due to delay in arrival.   Stroke:  Dominant left frontal operculum and left insular cortex infarct with a moderate amt of hemorrhagic transformation, infarct embolic secondary to unknown etiology   Resultant  Expressive aphasia  MRI  left frontal operculum and left insular cortex infarct with a moderate amt of hemorrhagic transformation  MRA  Moderate stenosis/thrombus L MCA   Carotid Doppler  No significant stenosis   2D Echo  pending  TEE to look for embolic source. Arranged with [facility] for Friday (unable to do today as schedule full; tomorrow is Thanksgiving and they will be closed).  If positive for PFO (patent foramen ovale), check bilateral lower extremity venous dopplers to rule out DVT as possible source of stroke. (I have made patient NPO after midnight Thurs night).  Will not consider loop until rest of workup reviewed Consider hypercoagulable labs pending workup results  Lovenox 40 mg sq daily for VTE prophylaxis  Diet NPO time specified Except for: Other (See Comments). For MBSS today with ST  no antithrombotics prior to admission, placed on aspirin 300 mg suppository daily in hospital. Given moderate hemorrhagic transformation   Ongoing aggressive stroke risk factor management  Therapy recommendations:  pending   Disposition:  pending   Hyperlipidemia  Home meds:  No statin  LDL 115, goal < 70  Add statin once cleared to swallow  Diabetes  HgbA1c 8.1, goal < 7.0  Uncontrolled  Other Stroke Risk Factors  ETOH use  Hospital day #  1  SHARON BIBY, MSN, APRN, ANVP-BC, AGPCNP-BC [facility] Pager: [phone] 10/16/2014 11:42 AM  I have personally examined this patient, reviewed notes, independently viewed imaging studies, participated in medical decision making and plan of care. I have made any additions or clarifications directly to the above note. Agree with note above. Suspected left MCA branch infarct of embolic etiology.source yet undetermined. Stroke w/u ensuing  Delia HeadyPramod Amaar Oshita, MD Medical Director [facility] Pager: [phone] 10/16/2014 12:46 PM   To contact Stroke Continuity provider, please refer to [email or web address]. After hours, contact General Neurology

## 2014-10-16 NOTE — Plan of Care (Signed)
Problem: Consults Goal: Ischemic Stroke Patient Education See Patient Education Module for education specifics. Outcome: Progressing Goal: Skin Care Protocol Initiated - if Braden Score 18 or less If consults are not indicated, leave blank or document N/A Outcome: Not Applicable Date Met:  35/39/12 Goal: Nutrition Consult-if indicated Outcome: Progressing Goal: Diabetes Guidelines if Diabetic/Glucose > 140 If diabetic or lab glucose is > 140 mg/dl - Initiate Diabetes/Hyperglycemia Guidelines & Document Interventions  Outcome: Completed/Met Date Met:  10/16/14  Problem: Progression Outcomes Goal: Communication method established Outcome: Progressing Goal: Rehab Team goals identified Outcome: Progressing Goal: Progressive activity as tolerated Outcome: Progressing Goal: Tolerating diet/TF at goal rate Outcome: Not Applicable Date Met:  25/83/46 Goal: Pain controlled Outcome: Completed/Met Date Met:  10/16/14 Goal: Bowel & Bladder Continence Outcome: Completed/Met Date Met:  10/16/14 Goal: Educational plan initiated Outcome: Completed/Met Date Met:  10/16/14 Goal: Other Progression Outcomes Outcome: Not Applicable Date Met:  21/94/71

## 2014-10-16 NOTE — Plan of Care (Signed)
Problem: SLP Language Goals Goal: Patient will follow commands during a functional ADL with Outcome: Progressing     

## 2014-10-17 DIAGNOSIS — I69320 Aphasia following cerebral infarction: Secondary | ICD-10-CM

## 2014-10-17 LAB — GLUCOSE, CAPILLARY
GLUCOSE-CAPILLARY: 94 mg/dL (ref 70–99)
Glucose-Capillary: 114 mg/dL — ABNORMAL HIGH (ref 70–99)
Glucose-Capillary: 154 mg/dL — ABNORMAL HIGH (ref 70–99)
Glucose-Capillary: 95 mg/dL (ref 70–99)
Glucose-Capillary: 127 mg/dL — ABNORMAL HIGH (ref 70–99)

## 2014-10-17 LAB — HOMOCYSTEINE: Homocysteine: 10.3 umol/L (ref 4.0–15.4)

## 2014-10-17 MED ORDER — PNEUMOCOCCAL VAC POLYVALENT 25 MCG/0.5ML IJ INJ
0.5000 mL | INJECTION | INTRAMUSCULAR | Status: AC
  Administered 2014-10-18: 0.5 mL via INTRAMUSCULAR
  Filled 2014-10-17: qty 0.5

## 2014-10-17 NOTE — Progress Notes (Signed)
STROKE TEAM PROGRESS NOTE   HISTORY Maxwell OleaDavid G Lipford is an 58 y.o. male history of diabetes this noticed his inability to speak 7 AM this morning 10/15/2014. It's unclear patient woke up with speech deficit. He was noted to be mute this afternoon he was beyond time window for and was brought to the emergency room for further evaluation and [facility]. CT scan of his mute his head showed an area of likely evolving stroke involving the left frontal region. Patient had no difficulty with understanding what was being said to him and following commands, but was mute. Patient was past the time window for treatment with TPA. NIH stroke scale was 9. Patient was not administered TPA secondary to delay in arrival. He was admitted for further evaluation and treatment.   SUBJECTIVE (INTERVAL HISTORY) His family is at the bedside.  Overall he feels his condition is unchanged. Transport is there to take him to vascular lab.   OBJECTIVE Temp:  [98.4 F (36.9 C)-99.4 F (37.4 C)] 99.4 F (37.4 C) (11/26 2123) Pulse Rate:  [58-79] 58 (11/26 2123) Cardiac Rhythm:  [-] Normal sinus rhythm (11/26 1500) Resp:  [16-18] 16 (11/26 1615) BP: (123-136)/(71-82) 128/77 mmHg (11/26 2123) SpO2:  [97 %-99 %] 97 % (11/26 2123) Weight:  [190 lb (86.183 kg)] 190 lb (86.183 kg) (11/26 0451)   Recent Labs Lab 10/17/14 0449 10/17/14 0752 10/17/14 1150 10/17/14 1613 10/17/14 2017  GLUCAP 94 114* 154* 95 127*    Recent Labs Lab 10/15/14 2226  NA 141  K 4.2  CL 105  CO2 23  GLUCOSE 174*  BUN 12  CREATININE 0.84  CALCIUM 8.9    Recent Labs Lab 10/15/14 2226  AST 21  ALT 21  ALKPHOS 71  BILITOT 0.5  PROT 7.1  ALBUMIN 3.8    Recent Labs Lab 10/15/14 2226  WBC 8.6  NEUTROABS 6.0  HGB 15.5  HCT 44.8  MCV 87.7  PLT 243    Recent Labs Lab 10/15/14 2226  TROPONINI <0.30   No results for input(s): LABPROT, INR in the last 72 hours.  Recent Labs  10/16/14 0651  COLORURINE YELLOW  LABSPEC  1.036*  PHURINE 6.0  GLUCOSEU >1000*  HGBUR NEGATIVE  BILIRUBINUR NEGATIVE  KETONESUR 15*  PROTEINUR NEGATIVE  UROBILINOGEN 0.2  NITRITE NEGATIVE  LEUKOCYTESUR NEGATIVE       Component Value Date/Time   CHOL 208* 10/15/2014 2239   TRIG 52 10/15/2014 2239   HDL 83 10/15/2014 2239   CHOLHDL 2.5 10/15/2014 2239   VLDL 10 10/15/2014 2239   LDLCALC 115* 10/15/2014 2239   Lab Results  Component Value Date   HGBA1C 8.1* 10/15/2014      Component Value Date/Time   LABOPIA NONE DETECTED 10/16/2014 0651   COCAINSCRNUR NONE DETECTED 10/16/2014 0651   LABBENZ NONE DETECTED 10/16/2014 0651   AMPHETMU NONE DETECTED 10/16/2014 0651   THCU POSITIVE* 10/16/2014 0651   LABBARB NONE DETECTED 10/16/2014 0651    No results for input(s): ETH in the last 168 hours.  Mr Brain Wo Contrast  10/16/2014   CLINICAL DATA:  Stroke.  Diabetes.  Expressive aphasia.  EXAM: MRI HEAD WITHOUT CONTRAST  MRA HEAD WITHOUT CONTRAST  TECHNIQUE: Multiplanar, multiecho pulse sequences of the brain and surrounding structures were obtained without intravenous contrast. Angiographic images of the head were obtained using MRA technique without contrast.  COMPARISON:  CT head 10/15/2014  FINDINGS: MRI HEAD FINDINGS  Acute hemorrhagic infarct in the left frontal operculum extending into the insular  cortex. Infarct measures approximately 3 x 5 cm. No other acute infarct identified  Ventricle size is normal. No shift of the midline structures. No significant chronic ischemic change. Cerebral white matter and brainstem are normal.  Negative for mass lesion.  MRA HEAD FINDINGS  Both vertebral arteries are patent to the basilar. PICA patent bilaterally. Basilar widely patent. Superior cerebellar arteries are patent bilaterally. Fetal origin of the posterior cerebral artery bilaterally with hypoplastic P1 segments bilaterally.  Internal carotid artery is patent bilaterally without significant stenosis.  Right anterior and right middle  cerebral arteries are widely patent without stenosis or occlusion.  Left anterior cerebral artery widely patent.  Left M1 segment widely patent. There is a stenosis at the left M1 bifurcation. Mild to moderate stenosis in the temporal branch of the left middle cerebral artery.  IMPRESSION: Acute hemorrhagic infarct in the left frontal operculum and left insular cortex. No other significant ischemic change.  Mild to moderate stenosis of the left MCA bifurcation extending into the temporal branch of the left MCA. This is less severe than would be expected for acute infarct and could represent some residual thrombus. Alternately, an embolus could be the cause of this infarction.   Electronically Signed   By: Marlan Palau M.D.   On: 10/16/2014 09:54   Dg Chest Port 1 View  10/15/2014   CLINICAL DATA:  Stroke, nonverbal.  EXAM: PORTABLE CHEST - 1 VIEW  COMPARISON:  None.  FINDINGS: The heart size and mediastinal contours are within normal limits. Both lungs are clear. The visualized skeletal structures are unremarkable.  IMPRESSION: No active disease.   Electronically Signed   By: Awilda Metro   On: 10/15/2014 23:14   Dg Swallowing Func-speech Pathology  10/16/2014   Carolan Shiver, CCC-SLP     10/16/2014  2:47 PM Objective Swallowing Evaluation: Modified Barium Swallowing Study   Patient Details  Name: Maxwell Clark MRN: [id number] Date of Birth: Jan 23, 1956  Today's Date: 10/16/2014 Time: 1315-1350 SLP Time Calculation (min) (ACUTE ONLY): 35 min  Past Medical History:  Past Medical History  Diagnosis Date  . Diabetes mellitus without complication   . Stroke 10/15/2014   Past Surgical History: History reviewed. No pertinent past  surgical history. HPI:  Maxwell Clark is a 58 y.o. male with history of diabetes  mellitus type 2 on insulin started experiencing some difficulty  speaking since morning. As the day progressed patient's symptoms  got worse and by evening patient was taken to [facility]  regional  hospital over there CT head done showed evolving stroke in the  left parietal region and patient was transferred to [facility] for further management. On-call neurologist Dr. Noel Christmas was consulted at this time neurologist felt that patient  was not a candidate for TPA. On exam patient is having right  facial droop and difficulty to speak. Patient has expressive  aphasia. Patient's tongue is deviated to the right.  MRI Acute  hemorrhagic infarct in the left frontal operculum and left  insular cortex.     Assessment / Plan / Recommendation Clinical Impression  Clinical impression: Pt presents with a mild oropharyngeal  dysphagia characterized by: 1) neurosensory impairments leading  to poor oral containment with anterior and posterior bolus loss;  2) trace, silent aspiration of thin liquids when consumed in  large quantities.  When pt consumes limited, small sips of  liquid, he is able to protect his airway.  A head turn to the  left  was also protective, but no more effective than limiting  bolus size.  Pharyngeal clearance was excellent with no residue  for any consistencies.  Spoke with pt and his family at length  re: results of exam, recommendations, strategies to maintain  safety.  Family had many questions; we discussed dysphagia and  aphasia, prognosis, and communication strategies.  Recommend  dysphagia 3 diet with thin liquids; meds whole with puree.  SLP  will continue to follow for dysphagia/aphasia.     Treatment Recommendation  Therapy as outlined in treatment plan below    Diet Recommendation Dysphagia 3 (Mechanical Soft);Thin liquid   Liquid Administration via: Cup Medication Administration: Whole meds with puree Supervision: Patient able to self feed;Full supervision/cueing  for compensatory strategies (initally to ensure safety) Compensations: Slow rate;Small sips/bites;Check for pocketing Postural Changes and/or Swallow Maneuvers: Seated upright 90  degrees    Other   Recommendations Oral Care Recommendations: Oral care BID   Follow Up Recommendations  Outpatient SLP    Frequency and Duration min 3x week  2 weeks       SLP Swallow Goals     General Date of Onset: 10/15/14 HPI: SAILOR HAUGHN is a 58 y.o. male with history of diabetes  mellitus type 2 on insulin started experiencing some difficulty  speaking since morning. As the day progressed patient's symptoms  got worse and by evening patient was taken to [facility] regional  hospital over there CT head done showed evolving stroke in the  left parietal region and patient was transferred to [facility] for further management. On-call neurologist Dr. Noel Christmas was consulted at this time neurologist felt that patient  was not a candidate for TPA. On exam patient is having right  facial droop and difficulty to speak. Patient has expressive  aphasia. Patient's tongue is deviated to the right.  MRI Acute  hemorrhagic infarct in the left frontal operculum and left Type of Study: Modified Barium Swallowing Study Reason for Referral: Objectively evaluate swallowing function Diet Prior to this Study: NPO Temperature Spikes Noted: No Respiratory Status: Room air History of Recent Intubation: No Behavior/Cognition: Alert;Cooperative;Pleasant mood Oral Cavity - Dentition: Adequate natural dentition Oral Motor / Sensory Function: Impaired - see Bedside swallow  eval Self-Feeding Abilities: Able to feed self Patient Positioning: Upright in chair Baseline Vocal Quality: Low vocal intensity Volitional Cough: Weak Volitional Swallow: Able to elicit Anatomy: Within functional limits Pharyngeal Secretions: Not observed secondary MBS    Reason for Referral Objectively evaluate swallowing function   Oral Phase Oral Preparation/Oral Phase Oral Phase: Impaired Oral - Nectar Oral - Nectar Cup: Right anterior bolus loss;Piecemeal swallowing Oral - Thin Oral - Thin Cup: Right anterior bolus loss;Piecemeal swallowing Oral - Solids Oral -  Puree: Weak lingual manipulation Oral - Mechanical Soft: Weak lingual manipulation Oral Phase - Comment Oral Phase - Comment: oral loss of liquids right side   Pharyngeal Phase Pharyngeal Phase Pharyngeal Phase: Impaired Pharyngeal - Nectar Pharyngeal - Nectar Cup: Premature spillage to pyriform sinuses Pharyngeal - Thin Pharyngeal - Thin Cup: Premature spillage to pyriform  sinuses;Compensatory strategies attempted (Comment);Trace  aspiration;Penetration/Aspiration during swallow (head turn to  left as effective as limiting bolus size) Penetration/Aspiration details (thin cup): Material enters  airway, passes BELOW cords without attempt by patient to eject  out (silent aspiration) Pharyngeal - Solids Pharyngeal - Puree: Premature spillage to valleculae Pharyngeal - Regular: Premature spillage to valleculae  Cervical Esophageal Phase   Amanda L. Samson Frederic, [place] CCC/SLP Pager [phone]  Blenda MountsCouture, Amanda Laurice 10/16/2014, 2:45 PM    Mr Maxine GlennMra Head/brain Wo Cm  10/16/2014   CLINICAL DATA:  Stroke.  Diabetes.  Expressive aphasia.  EXAM: MRI HEAD WITHOUT CONTRAST  MRA HEAD WITHOUT CONTRAST  TECHNIQUE: Multiplanar, multiecho pulse sequences of the brain and surrounding structures were obtained without intravenous contrast. Angiographic images of the head were obtained using MRA technique without contrast.  COMPARISON:  CT head 10/15/2014  FINDINGS: MRI HEAD FINDINGS  Acute hemorrhagic infarct in the left frontal operculum extending into the insular cortex. Infarct measures approximately 3 x 5 cm. No other acute infarct identified  Ventricle size is normal. No shift of the midline structures. No significant chronic ischemic change. Cerebral white matter and brainstem are normal.  Negative for mass lesion.  MRA HEAD FINDINGS  Both vertebral arteries are patent to the basilar. PICA patent bilaterally. Basilar widely patent. Superior cerebellar arteries are patent bilaterally. Fetal origin of the posterior cerebral  artery bilaterally with hypoplastic P1 segments bilaterally.  Internal carotid artery is patent bilaterally without significant stenosis.  Right anterior and right middle cerebral arteries are widely patent without stenosis or occlusion.  Left anterior cerebral artery widely patent.  Left M1 segment widely patent. There is a stenosis at the left M1 bifurcation. Mild to moderate stenosis in the temporal branch of the left middle cerebral artery.  IMPRESSION: Acute hemorrhagic infarct in the left frontal operculum and left insular cortex. No other significant ischemic change.  Mild to moderate stenosis of the left MCA bifurcation extending into the temporal branch of the left MCA. This is less severe than would be expected for acute infarct and could represent some residual thrombus. Alternately, an embolus could be the cause of this infarction.   Electronically Signed   By: Marlan Palauharles  Clark M.D.   On: 10/16/2014 09:54   Carotid Doppler  There is 1-39% bilateral ICA stenosis. Vertebral artery flow is antegrade.     PHYSICAL EXAM Pleasant middle aged Caucasian male not in distress.Awake alert. Afebrile. Head is nontraumatic. Neck is supple without bruit. Hearing is normal. Cardiac exam no murmur or gallop. Lungs are clear to auscultation. Distal pulses are well felt. Neurological Exam : Awake alert and appears oriented but difficult to judge given aphasia. Severe expressive aphasia and can barely mouth a few words. Mild receptive aphasia can follow simple midline and one-step commands only. Not able to name repeat. Can write his name but not sentences. Extraocular moments are full range without nystagmus. Blinks to threat bilaterally. Fundi were not visualized. Vision acuity seems adequate. Moderate right lower facial weakness. Tongue midline. Motor system exam reveals no upper or lower extremity drift. Symmetric and equal strength in all 4 extremities. No focal weakness. Touch and pinprick sensation appear  preserved bilaterally. Coordination is accurate. Reflexes are symmetric. Plantars are downgoing. Gait was not tested. ASSESSMENT/PLAN Mr. Maxwell OleaDavid G Enslow is a 58 y.o. male with history of diabetes presenting with new onset expressive aphasia. He did not receive IV t-PA due to delay in arrival.   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 Wall thickness was normal. Systolic  function was normal. The estimated ejection fraction was in the range of 60% to 65%. Wall motion was normal; there were no regional wall motion abnormalities. Doppler parameters are consistent with abnormal left ventricular relaxation (grade 1 diastolic dysfunction TEE to look for embolic source. Arranged with [facility] for Friday (unable to do today as schedule full; tomorrow is Thanksgiving and they will be closed).  If positive for PFO (patent foramen ovale), check bilateral lower extremity venous dopplers to rule out DVT as possible source of stroke. (I have made patient NPO after midnight Thurs night).  Will not consider loop until rest of workup reviewed Consider hypercoagulable labs pending workup results  Lovenox 40 mg sq daily for VTE prophylaxis Diet NPO time specified  DIET DYS 3. For MBSS today with ST  no antithrombotics prior to admission, placed on aspirin 300 mg suppository daily in hospital. Given moderate hemorrhagic transformation   Ongoing aggressive stroke risk factor management  Therapy recommendations:  pending   Disposition:  pending    Hyperlipidemia  Home meds:  No statin  LDL 115, goal < 70  Add statin once cleared to swallow  Diabetes  HgbA1c 8.1, goal < 7.0  Uncontrolled  Other Stroke Risk Factors  ETOH use  Hospital day # 2  Annie Main, MSN, APRN, ANVP-BC, AGPCNP-BC [facility] Pager: [phone] 10/17/2014 10:45 PM  I have personally examined this patient, reviewed notes, independently viewed imaging studies, participated in medical decision making and plan of care. I have made any additions or clarifications directly to the above note. Agree with note above. Suspected left MCA branch infarct of embolic etiology.source yet undetermined. Stroke w/u ensuing. TEE and loop recorder tomorrow  Delia Heady, MD Medical Director [facility] Pager: [phone] 10/17/2014 10:45 PM   To contact Stroke Continuity provider, please refer to [email or web address]. After hours, contact General Neurology

## 2014-10-17 NOTE — Progress Notes (Signed)
[facility] PROGRESS NOTE  Maxwell OleaDavid G Clark NGE:[id number] DOB: [birth year] DOA: 10/15/2014 PCP: Elizabeth SauerJONES,DEANNA, MD  Brief narrative 58 year old male with type 2 diabetes mellitus on insulin presented with acute onset of difficulty speaking on the morning of admission. As the day progressed his symptoms became worse and was taken to [facility] ED. Head CT done over there showed evolving stroke in the left parietal area and [facility]. Patient was out of therapeutic window for TPA so did not receive TPA. Patient on exam had right facial droop and was unable to speak severe expressive aphagia and deviation of the tongue to the right. Patient had good strength in all his extremities. Admitted to telemetry for stroke workup.    Assessment/Plan: Acute ischemic/embolic stroke MRI of the brain showing. Acute hemorrhagic infarct in the left frontal operculum extending into the insular cortex. Discussed with neurology appears to have embolic stroke with hemorrhagic transformation. Avoiding antiplatelets. Seen by speech and swallow therapy. Patient still unable to speak but has cleared swallowing evaluation. MBS done shows mild oropharyngeal dysphagia. Order mechanical soft diet and tolerating well.  -Elevated LDL noted and started on Lipitor 80 mg daily. -Allow permissive blood pressure. -Given concern for embolic stroke requested for TEE, which will possibly be on 11/27. Ordered hypercoagulable panel. -Carotid Doppler without significant carotid stenosis. 2-D echo with nomral EF, no WMA and normal valves. -Appreciate neurology recommendations  Type 2 diabetes mellitus, uncontrolled A1c of 8.1. Resumed home dose Lantus and sliding scale insulin. counseled on weight loss and exercise.  Cannabis use counseled on cessation.    Diet: Mechanical soft  DVT prophylaxis: sq lovenox   Code Status: Full code Family  Communication: Wife at bedside Disposition Plan: Home following TEE   Consultants:  Neurology   cardiology for TEE  Procedures: MRI brain Carotid Doppler 2-D echo TEE pending   Antibiotics:  NONE  HPI/Subjective: Seen and examined .still aphasic. No overnight issues  Objective: Filed Vitals:   10/17/14 0451  BP: 123/71  Pulse: 63  Temp: 98.5 F (36.9 C)  Resp: 16   No intake or output data in the 24 hours ending 10/17/14 0926 Filed Weights   10/15/14 2340 10/17/14 0451  Weight: 85.911 kg (189 lb 6.4 oz) 86.183 kg (190 lb)    Exam:   General:  NAD  HEENT: right facial droop,   Cardiovascular: NS1&S2, no murmurs  Respiratory: clear b/l, no added sounds  Abdomen: soft, NT ND BS+  Musculoskeletal: warm, no edema  CNS: rt facial droop with tongue deviation to the right, aphasic, normal power and tone b/l  Data Reviewed: Basic Metabolic Panel:  Recent Labs Lab 10/15/14 2226  NA 141  K 4.2  CL 105  CO2 23  GLUCOSE 174*  BUN 12  CREATININE 0.84  CALCIUM 8.9   Liver Function Tests:  Recent Labs Lab 10/15/14 2226  AST 21  ALT 21  ALKPHOS 71  BILITOT 0.5  PROT 7.1  ALBUMIN 3.8   No results for input(s): LIPASE, AMYLASE in the last 168 hours. No results for input(s): AMMONIA in the last 168 hours. CBC:  Recent Labs Lab 10/15/14 2226  WBC 8.6  NEUTROABS 6.0  HGB 15.5  HCT 44.8  MCV 87.7  PLT 243   Cardiac Enzymes:  Recent Labs Lab 10/15/14 2226  TROPONINI <0.30   BNP (last 3 results) No results for input(s): PROBNP in the last 8760 hours. CBG:  Recent Labs Lab 10/16/14  1656 10/16/14 2100 10/16/14 2351 10/17/14 0449 10/17/14 0752  GLUCAP 107* 135* 98 94 114*    No results found for this or any previous visit (from the past 240 hour(s)).   Studies: Mr Brain Wo Contrast  10/16/2014   CLINICAL DATA:  Stroke.  Diabetes.  Expressive aphasia.  EXAM: MRI HEAD WITHOUT CONTRAST  MRA HEAD WITHOUT CONTRAST  TECHNIQUE:  Multiplanar, multiecho pulse sequences of the brain and surrounding structures were obtained without intravenous contrast. Angiographic images of the head were obtained using MRA technique without contrast.  COMPARISON:  CT head 10/15/2014  FINDINGS: MRI HEAD FINDINGS  Acute hemorrhagic infarct in the left frontal operculum extending into the insular cortex. Infarct measures approximately 3 x 5 cm. No other acute infarct identified  Ventricle size is normal. No shift of the midline structures. No significant chronic ischemic change. Cerebral white matter and brainstem are normal.  Negative for mass lesion.  MRA HEAD FINDINGS  Both vertebral arteries are patent to the basilar. PICA patent bilaterally. Basilar widely patent. Superior cerebellar arteries are patent bilaterally. Fetal origin of the posterior cerebral artery bilaterally with hypoplastic P1 segments bilaterally.  Internal carotid artery is patent bilaterally without significant stenosis.  Right anterior and right middle cerebral arteries are widely patent without stenosis or occlusion.  Left anterior cerebral artery widely patent.  Left M1 segment widely patent. There is a stenosis at the left M1 bifurcation. Mild to moderate stenosis in the temporal branch of the left middle cerebral artery.  IMPRESSION: Acute hemorrhagic infarct in the left frontal operculum and left insular cortex. No other significant ischemic change.  Mild to moderate stenosis of the left MCA bifurcation extending into the temporal branch of the left MCA. This is less severe than would be expected for acute infarct and could represent some residual thrombus. Alternately, an embolus could be the cause of this infarction.   Electronically Signed   By: Marlan Palau M.D.   On: 10/16/2014 09:54   Dg Chest Port 1 View  10/15/2014   CLINICAL DATA:  Stroke, nonverbal.  EXAM: PORTABLE CHEST - 1 VIEW  COMPARISON:  None.  FINDINGS: The heart size and mediastinal contours are within normal  limits. Both lungs are clear. The visualized skeletal structures are unremarkable.  IMPRESSION: No active disease.   Electronically Signed   By: Awilda Metro   On: 10/15/2014 23:14   Dg Swallowing Func-speech Pathology  10/16/2014   Carolan Shiver, CCC-SLP     10/16/2014  2:47 PM Objective Swallowing Evaluation: Modified Barium Swallowing Study   Patient Details  Name: KELLER BOUNDS MRN: [id number] Date of Birth: Feb 05, 1956  Today's Date: 10/16/2014 Time: 1315-1350 SLP Time Calculation (min) (ACUTE ONLY): 35 min  Past Medical History:  Past Medical History  Diagnosis Date  . Diabetes mellitus without complication   . Stroke 10/15/2014   Past Surgical History: History reviewed. No pertinent past  surgical history. HPI:  LAMON ROTUNDO is a 58 y.o. male with history of diabetes  mellitus type 2 on insulin started experiencing some difficulty  speaking since morning. As the day progressed patient's symptoms  got worse and by evening patient was taken to [facility] over there CT head done showed evolving stroke in the  left parietal region and patient was transferred to [facility] for further management. On-call neurologist Dr. Noel Christmas was consulted at this time neurologist felt that patient  was not a candidate for TPA. On exam  patient is having right  facial droop and difficulty to speak. Patient has expressive  aphasia. Patient's tongue is deviated to the right.  MRI Acute  hemorrhagic infarct in the left frontal operculum and left  insular cortex.     Assessment / Plan / Recommendation Clinical Impression  Clinical impression: Pt presents with a mild oropharyngeal  dysphagia characterized by: 1) neurosensory impairments leading  to poor oral containment with anterior and posterior bolus loss;  2) trace, silent aspiration of thin liquids when consumed in  large quantities.  When pt consumes limited, small sips of  liquid, he is able to protect his airway.  A  head turn to the  left was also protective, but no more effective than limiting  bolus size.  Pharyngeal clearance was excellent with no residue  for any consistencies.  Spoke with pt and his family at length  re: results of exam, recommendations, strategies to maintain  safety.  Family had many questions; we discussed dysphagia and  aphasia, prognosis, and communication strategies.  Recommend  dysphagia 3 diet with thin liquids; meds whole with puree.  SLP  will continue to follow for dysphagia/aphasia.     Treatment Recommendation  Therapy as outlined in treatment plan below    Diet Recommendation Dysphagia 3 (Mechanical Soft);Thin liquid   Liquid Administration via: Cup Medication Administration: Whole meds with puree Supervision: Patient able to self feed;Full supervision/cueing  for compensatory strategies (initally to ensure safety) Compensations: Slow rate;Small sips/bites;Check for pocketing Postural Changes and/or Swallow Maneuvers: Seated upright 90  degrees    Other  Recommendations Oral Care Recommendations: Oral care BID   Follow Up Recommendations  Outpatient SLP    Frequency and Duration min 3x week  2 weeks       SLP Swallow Goals     General Date of Onset: 10/15/14 HPI: RIELY BASKETT is a 58 y.o. male with history of diabetes  mellitus type 2 on insulin started experiencing some difficulty  speaking since morning. As the day progressed patient's symptoms  got worse and by evening patient was taken to [facility] over there CT head done showed evolving stroke in the  left parietal region and patient was transferred to [facility] for further management. On-call neurologist Dr. Noel Christmas was consulted at this time neurologist felt that patient  was not a candidate for TPA. On exam patient is having right  facial droop and difficulty to speak. Patient has expressive  aphasia. Patient's tongue is deviated to the right.  MRI Acute  hemorrhagic infarct in the left  frontal operculum and left Type of Study: Modified Barium Swallowing Study Reason for Referral: Objectively evaluate swallowing function Diet Prior to this Study: NPO Temperature Spikes Noted: No Respiratory Status: Room air History of Recent Intubation: No Behavior/Cognition: Alert;Cooperative;Pleasant mood Oral Cavity - Dentition: Adequate natural dentition Oral Motor / Sensory Function: Impaired - see Bedside swallow  eval Self-Feeding Abilities: Able to feed self Patient Positioning: Upright in chair Baseline Vocal Quality: Low vocal intensity Volitional Cough: Weak Volitional Swallow: Able to elicit Anatomy: Within functional limits Pharyngeal Secretions: Not observed secondary MBS    Reason for Referral Objectively evaluate swallowing function   Oral Phase Oral Preparation/Oral Phase Oral Phase: Impaired Oral - Nectar Oral - Nectar Cup: Right anterior bolus loss;Piecemeal swallowing Oral - Thin Oral - Thin Cup: Right anterior bolus loss;Piecemeal swallowing Oral - Solids Oral - Puree: Weak lingual manipulation Oral - Mechanical Soft: Weak lingual manipulation  Oral Phase - Comment Oral Phase - Comment: oral loss of liquids right side   Pharyngeal Phase Pharyngeal Phase Pharyngeal Phase: Impaired Pharyngeal - Nectar Pharyngeal - Nectar Cup: Premature spillage to pyriform sinuses Pharyngeal - Thin Pharyngeal - Thin Cup: Premature spillage to pyriform  sinuses;Compensatory strategies attempted (Comment);Trace  aspiration;Penetration/Aspiration during swallow (head turn to  left as effective as limiting bolus size) Penetration/Aspiration details (thin cup): Material enters  airway, passes BELOW cords without attempt by patient to eject  out (silent aspiration) Pharyngeal - Solids Pharyngeal - Puree: Premature spillage to valleculae Pharyngeal - Regular: Premature spillage to valleculae  Cervical Esophageal Phase   Amanda L. Samson Fredericouture, [place] CCC/SLP Pager [phone]               Blenda MountsCouture, Amanda Laurice 10/16/2014, 2:45 PM     Mr Maxine GlennMra Head/brain Wo Cm  10/16/2014   CLINICAL DATA:  Stroke.  Diabetes.  Expressive aphasia.  EXAM: MRI HEAD WITHOUT CONTRAST  MRA HEAD WITHOUT CONTRAST  TECHNIQUE: Multiplanar, multiecho pulse sequences of the brain and surrounding structures were obtained without intravenous contrast. Angiographic images of the head were obtained using MRA technique without contrast.  COMPARISON:  CT head 10/15/2014  FINDINGS: MRI HEAD FINDINGS  Acute hemorrhagic infarct in the left frontal operculum extending into the insular cortex. Infarct measures approximately 3 x 5 cm. No other acute infarct identified  Ventricle size is normal. No shift of the midline structures. No significant chronic ischemic change. Cerebral white matter and brainstem are normal.  Negative for mass lesion.  MRA HEAD FINDINGS  Both vertebral arteries are patent to the basilar. PICA patent bilaterally. Basilar widely patent. Superior cerebellar arteries are patent bilaterally. Fetal origin of the posterior cerebral artery bilaterally with hypoplastic P1 segments bilaterally.  Internal carotid artery is patent bilaterally without significant stenosis.  Right anterior and right middle cerebral arteries are widely patent without stenosis or occlusion.  Left anterior cerebral artery widely patent.  Left M1 segment widely patent. There is a stenosis at the left M1 bifurcation. Mild to moderate stenosis in the temporal branch of the left middle cerebral artery.  IMPRESSION: Acute hemorrhagic infarct in the left frontal operculum and left insular cortex. No other significant ischemic change.  Mild to moderate stenosis of the left MCA bifurcation extending into the temporal branch of the left MCA. This is less severe than would be expected for acute infarct and could represent some residual thrombus. Alternately, an embolus could be the cause of this infarction.   Electronically Signed   By: Marlan Palauharles  Clark M.D.   On: 10/16/2014 09:54    Scheduled  Meds: . antiseptic oral rinse  7 mL Mouth Rinse BID  . atorvastatin  80 mg Oral q1800  . enoxaparin (LOVENOX) injection  40 mg Subcutaneous Q24H  . insulin aspart  0-9 Units Subcutaneous Q4H  . insulin glargine  10 Units Subcutaneous QHS   Continuous Infusions:       Time spent: 25 minutes    Tamera Pingley  [facility] Pager [phone] If 7PM-7AM, please contact night-coverage at [email or web address], password [email or web address] 10/17/2014, 9:26 AM  LOS: 2 days

## 2014-10-17 NOTE — Plan of Care (Signed)
Problem: Progression Outcomes Goal: Progressive activity as tolerated Outcome: Completed/Met Date Met:  10/17/14

## 2014-10-17 NOTE — Plan of Care (Signed)
Problem: Consults Goal: Ischemic Stroke Patient Education See Patient Education Module for education specifics.  Outcome: Progressing Patient and family at bedside educated on stroke risk factors and therapy

## 2014-10-18 ENCOUNTER — Encounter (HOSPITAL_COMMUNITY)
Admission: EM | Disposition: A | Discharge status: Home / Self Care | Payer: Self-pay | Source: Home / Self Care | Attending: Internal Medicine

## 2014-10-18 ENCOUNTER — Encounter (HOSPITAL_COMMUNITY): Payer: Self-pay | Admitting: *Deleted

## 2014-10-18 DIAGNOSIS — I6350 Cerebral infarction due to unspecified occlusion or stenosis of unspecified cerebral artery: Secondary | ICD-10-CM

## 2014-10-18 DIAGNOSIS — I639 Cerebral infarction, unspecified: Secondary | ICD-10-CM

## 2014-10-18 DIAGNOSIS — E785 Hyperlipidemia, unspecified: Secondary | ICD-10-CM | POA: Diagnosis present

## 2014-10-18 HISTORY — PX: TEE WITHOUT CARDIOVERSION: SHX5443

## 2014-10-18 HISTORY — PX: LOOP RECORDER IMPLANT: SHX5477

## 2014-10-18 LAB — GLUCOSE, CAPILLARY
GLUCOSE-CAPILLARY: 147 mg/dL — AB (ref 70–99)
Glucose-Capillary: 137 mg/dL — ABNORMAL HIGH (ref 70–99)
Glucose-Capillary: 137 mg/dL — ABNORMAL HIGH (ref 70–99)
Glucose-Capillary: 141 mg/dL — ABNORMAL HIGH (ref 70–99)

## 2014-10-18 LAB — LUPUS ANTICOAGULANT PANEL
DRVVT: 34.4 secs (ref <42.9)
Lupus Anticoagulant: NOT DETECTED
PTT Lupus Anticoagulant: 37.3 secs (ref 28.0–43.0)

## 2014-10-18 LAB — PROTEIN S ACTIVITY: PROTEIN S ACTIVITY: 91 % (ref 69–129)

## 2014-10-18 LAB — PROTEIN C ACTIVITY: PROTEIN C ACTIVITY: 139 % — AB (ref 75–133)

## 2014-10-18 SURGERY — ECHOCARDIOGRAM, TRANSESOPHAGEAL
Anesthesia: Moderate Sedation

## 2014-10-18 SURGERY — LOOP RECORDER IMPLANT
Anesthesia: LOCAL

## 2014-10-18 MED ORDER — BUTAMBEN-TETRACAINE-BENZOCAINE 2-2-14 % EX AERO
INHALATION_SPRAY | CUTANEOUS | Status: DC | PRN
  Administered 2014-10-18: 2 via TOPICAL

## 2014-10-18 MED ORDER — LIDOCAINE-EPINEPHRINE 1 %-1:100000 IJ SOLN
INTRAMUSCULAR | Status: AC
  Filled 2014-10-18: qty 1

## 2014-10-18 MED ORDER — FENTANYL CITRATE 0.05 MG/ML IJ SOLN
INTRAMUSCULAR | Status: DC | PRN
Start: 2014-10-18 — End: 2014-10-18
  Administered 2014-10-18: 50 ug via INTRAVENOUS
  Administered 2014-10-18: 25 ug via INTRAVENOUS

## 2014-10-18 MED ORDER — FENTANYL CITRATE 0.05 MG/ML IJ SOLN
INTRAMUSCULAR | Status: AC
  Filled 2014-10-18: qty 2

## 2014-10-18 MED ORDER — ASPIRIN 325 MG PO TABS
325.0000 mg | ORAL_TABLET | Freq: Every day | ORAL | Status: DC
  Administered 2014-10-18: 325 mg via ORAL
  Filled 2014-10-18: qty 1

## 2014-10-18 MED ORDER — SODIUM CHLORIDE 0.9 % IV SOLN: INTRAVENOUS | Status: DC

## 2014-10-18 MED ORDER — ASPIRIN EC 81 MG PO TBEC: 81.0000 mg | DELAYED_RELEASE_TABLET | Freq: Every day | ORAL | Status: DC

## 2014-10-18 MED ORDER — MIDAZOLAM HCL 10 MG/2ML IJ SOLN
INTRAMUSCULAR | Status: DC | PRN
  Administered 2014-10-18 (×2): 2 mg via INTRAVENOUS
  Administered 2014-10-18: 1 mg via INTRAVENOUS

## 2014-10-18 MED ORDER — ATORVASTATIN CALCIUM 80 MG PO TABS: 80.0000 mg | ORAL_TABLET | Freq: Every day | ORAL | Status: DC

## 2014-10-18 MED ORDER — MIDAZOLAM HCL 5 MG/ML IJ SOLN
INTRAMUSCULAR | Status: AC
  Filled 2014-10-18: qty 2

## 2014-10-18 NOTE — Interval H&P Note (Signed)
History and Physical Interval Note:  10/18/2014 10:47 AM  Maxwell Clark  has presented today for surgery, with the diagnosis of stroke  The various methods of treatment have been discussed with the patient and family. After consideration of risks, benefits and other options for treatment, the patient has consented to  Procedure(s): LOOP RECORDER IMPLANT (N/A) as a surgical intervention .  The patient's history has been reviewed, patient examined, no change in status, stable for surgery.  I have reviewed the patient's chart and labs.  Questions were answered to the patient's satisfaction.     Sherryl MangesSteven Klein

## 2014-10-18 NOTE — Op Note (Signed)
INDICATIONS: cryptogenic stroke  PROCEDURE:   Informed consent was obtained prior to the procedure. The risks, benefits and alternatives for the procedure were discussed and the patient comprehended these risks.  Risks include, but are not limited to, cough, sore throat, vomiting, nausea, somnolence, esophageal and stomach trauma or perforation, bleeding, low blood pressure, aspiration, pneumonia, infection, trauma to the teeth and death.    After a procedural time-out, the oropharynx was anesthetized with 20% benzocaine spray. The patient was given 5 mg versed and 75 mcg fentanyl for moderate sedation.   The transesophageal probe was inserted in the esophagus and stomach without difficulty and multiple views were obtained.  The patient was kept under observation until the patient left the procedure room.  The patient left the procedure room in stable condition.   Agitated microbubble saline contrast was administered.  COMPLICATIONS:    There were no immediate complications.  FINDINGS:  Normal TEE. No intracardiac shunt. No atherosclerosis of the aorta.  RECOMMENDATIONS:     Loop recorder implantation.  Time Spent Directly with the Patient:  60 minutes   Ulani Degrasse 10/18/2014, 11:28 AM

## 2014-10-18 NOTE — Discharge Instructions (Addendum)
Ischemic Stroke °A stroke (cerebrovascular accident) is the sudden death of brain tissue. It is a medical emergency. A stroke can cause permanent loss of brain function. This can cause problems with different parts of your body. A transient ischemic attack (TIA) is different because it does not cause permanent damage. A TIA is a short-lived problem of poor blood flow affecting a part of the brain. A TIA is also a serious problem because having a TIA greatly increases the chances of having a stroke. When symptoms first develop, you cannot know if the problem might be a stroke or a TIA. °CAUSES  °A stroke is caused by a decrease of oxygen supply to an area of your brain. It is usually the result of a small blood clot or collection of cholesterol or fat (plaque) that blocks blood flow in the brain. A stroke can also be caused by blocked or damaged carotid arteries.  °RISK FACTORS °· High blood pressure (hypertension). °· High cholesterol. °· Diabetes mellitus. °· Heart disease. °· The buildup of plaque in the blood vessels (peripheral artery disease or atherosclerosis). °· The buildup of plaque in the blood vessels providing blood and oxygen to the brain (carotid artery stenosis). °· An abnormal heart rhythm (atrial fibrillation). °· Obesity. °· Smoking. °· Taking oral contraceptives (especially in combination with smoking). °· Physical inactivity. °· A diet high in fats, salt (sodium), and calories. °· Alcohol use. °· Use of illegal drugs (especially cocaine and methamphetamine). °· Being African American. °· Being over the age of 55. °· Family history of stroke. °· Previous history of blood clots, stroke, TIA, or heart attack. °· Sickle cell disease. °SYMPTOMS  °These symptoms usually develop suddenly, or may be newly present upon awakening from sleep: °· Sudden weakness or numbness of the face, arm, or leg, especially on one side of the body. °· Sudden trouble walking or difficulty moving arms or legs. °· Sudden  confusion. °· Sudden personality changes. °· Trouble speaking (aphasia) or understanding. °· Difficulty swallowing. °· Sudden trouble seeing in one or both eyes. °· Double vision. °· Dizziness. °· Loss of balance or coordination. °· Sudden severe headache with no known cause. °· Trouble reading or writing. °DIAGNOSIS  °Your health care provider can often determine the presence or absence of a stroke based on your symptoms, history, and physical exam. Computed tomography (CT) of the brain is usually performed to confirm the stroke, determine causes, and determine stroke severity. Other tests may be done to find the cause of the stroke. These tests may include: °· Electrocardiography. °· Continuous heart monitoring. °· Echocardiography. °· Carotid ultrasonography. °· Magnetic resonance imaging (MRI). °· A scan of the brain circulation. °· Blood tests. °PREVENTION  °The risk of a stroke can be decreased by appropriately treating high blood pressure, high cholesterol, diabetes, heart disease, and obesity and by quitting smoking, limiting alcohol, and staying physically active. °TREATMENT  °Time is of the essence. It is important to seek treatment at the first sign of these symptoms because you may receive a medicine to dissolve the clot (thrombolytic) that cannot be given if too much time has passed since your symptoms began. Even if you do not know when your symptoms began, get treatment as soon as possible as there are other treatment options available including oxygen, intravenous (IV) fluids, and medicines to thin the blood (anticoagulants). Treatment of stroke depends on the duration, severity, and cause of your symptoms. Medicines and dietary changes may be used to address diabetes, high blood   pressure, and other risk factors. Physical, speech, and occupational therapists will assess you and work with you to improve any functions impaired by the stroke. Measures will be taken to prevent short-term and long-term  complications, including infection from breathing foreign material into the lungs (aspiration pneumonia), blood clots in the legs, bedsores, and falls. Rarely, surgery may be needed to remove large blood clots or to open up blocked arteries. °HOME CARE INSTRUCTIONS  °· Take medicines only as directed by your health care provider. Follow the directions carefully. Medicines may be used to control risk factors for a stroke. Be sure you understand all your medicine instructions. °· You may be told to take a medicine to thin the blood, such as aspirin or the anticoagulant warfarin. Warfarin needs to be taken exactly as instructed. °¨ Too much and too little warfarin are both dangerous. Too much warfarin increases the risk of bleeding. Too little warfarin continues to allow the risk for blood clots. While taking warfarin, you will need to have regular blood tests to measure your blood clotting time. These blood tests usually include both the PT and INR tests. The PT and INR results allow your health care provider to adjust your dose of warfarin. The dose can change for many reasons. It is critically important that you take warfarin exactly as prescribed, and that you have your PT and INR levels drawn exactly as directed. °¨ Many foods, especially foods high in vitamin K, can interfere with warfarin and affect the PT and INR results. Foods high in vitamin K include spinach, kale, broccoli, cabbage, collard and turnip greens, brussels sprouts, peas, cauliflower, seaweed, and parsley, as well as beef and pork liver, green tea, and soybean oil. You should eat a consistent amount of foods high in vitamin K. Avoid major changes in your diet, or notify your health care provider before changing your diet. Arrange a visit with a dietitian to answer your questions. °¨ Many medicines can interfere with warfarin and affect the PT and INR results. You must tell your health care provider about any and all medicines you take. This  includes all vitamins and supplements. Be especially cautious with aspirin and anti-inflammatory medicines. Do not take or discontinue any prescribed or over-the-counter medicine except on the advice of your health care provider or pharmacist. °¨ Warfarin can have side effects, such as excessive bruising or bleeding. You will need to hold pressure over cuts for longer than usual. Your health care provider or pharmacist will discuss other potential side effects. °¨ Avoid sports or activities that may cause injury or bleeding. °¨ Be mindful when shaving, flossing your teeth, or handling sharp objects. °¨ Alcohol can change the body's ability to handle warfarin. It is best to avoid alcoholic drinks or consume only very small amounts while taking warfarin. Notify your health care provider if you change your alcohol intake. °¨ Notify your dentist or other health care providers before procedures. °· If swallow studies have determined that your swallowing reflex is present, you should eat healthy foods. Including 5 or more servings of fruits and vegetables a day may reduce the risk of stroke. Foods may need to be a certain consistency (soft or pureed), or small bites may need to be taken in order to avoid aspirating or choking. Certain dietary changes may be advised to address high blood pressure, high cholesterol, diabetes, or obesity. °¨ Food choices that are low in sodium, saturated fat, trans fat, and cholesterol are recommended to manage high blood pressure. °¨   Food choies that are high in fiber, and low in saturated fat, trans fat, and cholesterol may control cholesterol levels.  Controlling carbohydrates and sugar intake is recommended to manage diabetes.  Reducing calorie intake and making food choices that are low in sodium, saturated fat, trans fat, and cholesterol are recommended to manage obesity.  Maintain a healthy weight.  Stay physically active. It is recommended that you get at least 30 minutes of  activity on all or most days.  Do not use any tobacco products including cigarettes, chewing tobacco, or electronic cigarettes.  Limit alcohol use even if you are not taking warfarin. Moderate alcohol use is considered to be:  No more than 2 drinks each day for men.  No more than 1 drink each day for nonpregnant women.  Home safety. A safe home environment is important to reduce the risk of falls. Your health care provider may arrange for specialists to evaluate your home. Having grab bars in the bedroom and bathroom is often important. Your health care provider may arrange for equipment to be used at home, such as raised toilets and a seat for the shower.  Physical, occupational, and speech therapy. Ongoing therapy may be needed to maximize your recovery after a stroke. If you have been advised to use a walker or a cane, use it at all times. Be sure to keep your therapy appointments.  Follow all instructions for follow-up with your health care provider. This is very important. This includes any referrals, physical therapy, rehabilitation, and lab tests. Proper follow-up can prevent another stroke from occurring. SEEK MEDICAL CARE IF:  You have personality changes.  You have difficulty swallowing.  You are seeing double.  You have dizziness.  You have a fever.  You have skin breakdown. SEEK IMMEDIATE MEDICAL CARE IF:  Any of these symptoms may represent a serious problem that is an emergency. Do not wait to see if the symptoms will go away. Get medical help right away. Call your local emergency services (911 in [place] Do not drive yourself to the hospital.  You have sudden weakness or numbness of the face, arm, or leg, especially on one side of the body.  You have sudden trouble walking or difficulty moving arms or legs.  You have sudden confusion.  You have trouble speaking (aphasia) or understanding.  You have sudden trouble seeing in one or both eyes.  You have a loss of  balance or coordination.  You have a sudden, severe headache with no known cause.  You have new chest pain or an irregular heartbeat.  You have a partial or total loss of consciousness. Document Released: 11/08/2005 Document Revised: 03/25/2014 Document Reviewed: 06/18/2012 [facility] Patient Information 2015 [employer] [place]. This information is not intended to replace advice given to you by your health care provider. Make sure you discuss any questions you have with your health care provider.  Do not get loop recorder site wet for 3 days, then shower as usual.  Dr. Odessa FlemingKlein's office will call you with date and time for follow up in device clinic.    STROKE/TIA DISCHARGE INSTRUCTIONS SMOKING Cigarette smoking nearly doubles your risk of having a stroke & is the single most alterable risk factor  If you smoke or have smoked in the last 12 months, you are advised to quit smoking for your health. Most of the excess cardiovascular risk related to smoking disappears within a year of stopping. Ask you doctor about anti-smoking medications [place] Quit Line: 1-800-QUIT NOW Free  Smoking Cessation Classes [phone]  CHOLESTEROL Know your levels; limit fat & cholesterol in your diet  Lipid Panel     Component Value Date/Time   CHOL 208* 10/15/2014 2239   TRIG 52 10/15/2014 2239   HDL 83 10/15/2014 2239   CHOLHDL 2.5 10/15/2014 2239   VLDL 10 10/15/2014 2239   LDLCALC 115* 10/15/2014 2239     Many patients benefit from treatment even if their cholesterol is at goal. Goal: Total Cholesterol (CHOL) less than 160 Goal:  Triglycerides (TRIG) less than 150 Goal:  HDL greater than 40 Goal:  LDL (LDLCALC) less than 100   BLOOD PRESSURE American Stroke Association blood pressure target is less that 120/80 mm/Hg  Your discharge blood pressure is:  BP: 121/63 mmHg Monitor your blood pressure Limit your salt and alcohol intake Many individuals will require more than one medication for high blood  pressure  DIABETES (A1c is a blood sugar average for last 3 months) Goal HGBA1c is under 7% (HBGA1c is blood sugar average for last 3 months)  Diabetes: Your A1C=8.1 drawn on 10/15/2014    Lab Results  Component Value Date   HGBA1C 8.1* 10/15/2014    Your HGBA1c can be lowered with medications, healthy diet, and exercise. Check your blood sugar as directed by your physician Call your physician if you experience unexplained or low blood sugars.  PHYSICAL ACTIVITY/REHABILITATION Goal is 30 minutes at least 4 days per week  Activity: Increase activity slowly, Therapies: Speech Therapy as an Outpatient Return to work:  Activity decreases your risk of heart attack and stroke and makes your heart stronger.  It helps control your weight and blood pressure; helps you relax and can improve your mood. Participate in a regular exercise program. Talk with your doctor about the best form of exercise for you (dancing, walking, swimming, cycling).  DIET/WEIGHT Goal is to maintain a healthy weight  Your discharge diet is: DIET DYS 3 thin liquids Your height is:  Height: 5\' 10"  (177.8 cm) Your current weight is: Weight: 85.186 kg (187 lb 12.8 oz) Your Body Mass Index (BMI) is:  BMI (Calculated): 27.2 Following the type of diet specifically designed for you will help prevent another stroke. Your goal weight range is:   Your goal Body Mass Index (BMI) is 19-24. Healthy food habits can help reduce 3 risk factors for stroke:  High cholesterol, hypertension, and excess weight.  RESOURCES Stroke/Support Group:  Call [phone]   STROKE EDUCATION PROVIDED/REVIEWED AND GIVEN TO PATIENT Stroke warning signs and symptoms How to activate emergency medical system (call 911). Medications prescribed at discharge. Need for follow-up after discharge. Personal risk factors for stroke. Pneumonia vaccine given: Yes 10/18/2014 Flu vaccine given: No My questions have been answered, the writing is legible, and I  understand these instructions.  I will adhere to these goals & educational materials that have been provided to me after my discharge from the hospital.

## 2014-10-18 NOTE — Progress Notes (Signed)
SLP Cancellation Note  Patient Details Name: Marguerite OleaDavid G Hannon MRN: [id number] DOB: 1956-02-17   Cancelled treatment:       Reason Eval/Treat Not Completed: Patient at procedure or test/unavailable. Pt is NPO for TEE, will f/u as able.    Yianna Tersigni, Riley NearingBonnie Caroline 10/18/2014, 8:29 AM

## 2014-10-18 NOTE — Progress Notes (Signed)
Talked to spouse about Outpatient Speech Therapy choices; [place] [facility]; orders/ clinical information faxed as requestedAlexis Goodell; B Malene Blaydes RN,BSN,MHA [phone]

## 2014-10-18 NOTE — Interval H&P Note (Signed)
History and Physical Interval Note:  10/18/2014 10:03 AM  Maxwell Clark  has presented today for surgery, with the diagnosis of stroke  The various methods of treatment have been discussed with the patient and family. After consideration of risks, benefits and other options for treatment, the patient has consented to  Procedure(s) with comments: TRANSESOPHAGEAL ECHOCARDIOGRAM (TEE) (N/A) - loop recorder to follow  as a surgical intervention .  The patient's history has been reviewed, patient examined, no change in status, stable for surgery.  I have reviewed the patient's chart and labs.  Questions were answered to the patient's satisfaction.     Carina Chaplin

## 2014-10-18 NOTE — Progress Notes (Signed)
  Echocardiogram Echocardiogram Transesophageal has been performed.  Maxwell Clark, Maxwell Clark 10/18/2014, 10:36 AM

## 2014-10-18 NOTE — Progress Notes (Signed)
STROKE TEAM PROGRESS NOTE   HISTORY Maxwell Clark is an 58 y.o. male history of diabetes this noticed his inability to speak 7 AM this morning 10/15/2014. It's unclear patient woke up with speech deficit. He was noted to be mute this afternoon he was beyond time window for and was brought to the emergency room for further evaluation and [facility]. CT scan of his mute his head showed an area of likely evolving stroke involving the left frontal region. Patient had no difficulty with understanding what was being said to him and following commands, but was mute. Patient was past the time window for treatment with TPA. NIH stroke scale was 9. Patient was not administered TPA secondary to delay in arrival. He was admitted for further evaluation and treatment.   SUBJECTIVE (INTERVAL HISTORY) His family is at the bedside.  Overall he feels his condition is unchanged. TEE negative and loop recorder done. Likely DC home later today. D/w wife and daughter  OBJECTIVE Temp:  [98 F (36.7 C)-99.4 F (37.4 C)] 98 F (36.7 C) (11/27 1210) Pulse Rate:  [51-83] 56 (11/27 1210) Cardiac Rhythm:  [-] Normal sinus rhythm (11/26 1935) Resp:  [13-23] 16 (11/27 1210) BP: (115-172)/(63-97) 121/63 mmHg (11/27 1210) SpO2:  [94 %-100 %] 95 % (11/27 1210) Weight:  [187 lb 12.8 oz (85.186 kg)] 187 lb 12.8 oz (85.186 kg) (11/27 0435)   Recent Labs Lab 10/17/14 2017 10/18/14 0031 10/18/14 0433 10/18/14 0739 10/18/14 1157  GLUCAP 127* 137* 137* 141* 147*    Recent Labs Lab 10/15/14 2226  NA 141  K 4.2  CL 105  CO2 23  GLUCOSE 174*  BUN 12  CREATININE 0.84  CALCIUM 8.9    Recent Labs Lab 10/15/14 2226  AST 21  ALT 21  ALKPHOS 71  BILITOT 0.5  PROT 7.1  ALBUMIN 3.8    Recent Labs Lab 10/15/14 2226  WBC 8.6  NEUTROABS 6.0  HGB 15.5  HCT 44.8  MCV 87.7  PLT 243    Recent Labs Lab 10/15/14 2226  TROPONINI <0.30   No results for input(s): LABPROT, INR in the last 72 hours.  Recent  Labs  10/16/14 0651  COLORURINE YELLOW  LABSPEC 1.036*  PHURINE 6.0  GLUCOSEU >1000*  HGBUR NEGATIVE  BILIRUBINUR NEGATIVE  KETONESUR 15*  PROTEINUR NEGATIVE  UROBILINOGEN 0.2  NITRITE NEGATIVE  LEUKOCYTESUR NEGATIVE       Component Value Date/Time   CHOL 208* 10/15/2014 2239   TRIG 52 10/15/2014 2239   HDL 83 10/15/2014 2239   CHOLHDL 2.5 10/15/2014 2239   VLDL 10 10/15/2014 2239   LDLCALC 115* 10/15/2014 2239   Lab Results  Component Value Date   HGBA1C 8.1* 10/15/2014      Component Value Date/Time   LABOPIA NONE DETECTED 10/16/2014 0651   COCAINSCRNUR NONE DETECTED 10/16/2014 0651   LABBENZ NONE DETECTED 10/16/2014 0651   AMPHETMU NONE DETECTED 10/16/2014 0651   THCU POSITIVE* 10/16/2014 0651   LABBARB NONE DETECTED 10/16/2014 0651    No results for input(s): ETH in the last 168 hours.  No results found. Carotid Doppler  There is 1-39% bilateral ICA stenosis. Vertebral artery flow is antegrade.     PHYSICAL EXAM Pleasant middle aged Caucasian male not in distress.Awake alert. Afebrile. Head is nontraumatic. Neck is supple without bruit. Hearing is normal. Cardiac exam no murmur or gallop. Lungs are clear to auscultation. Distal pulses are well felt. Neurological Exam : Awake alert and appears oriented but difficult to judge  given aphasia. Severe expressive aphasia and can barely mouth a few words. Mild receptive aphasia can follow simple midline and one-step commands only. Not able to name repeat. Can write his name but not sentences. Extraocular moments are full range without nystagmus. Blinks to threat bilaterally. Fundi were not visualized. Vision acuity seems adequate. Moderate right lower facial weakness. Tongue midline. Motor system exam reveals no upper or lower extremity drift. Symmetric and equal strength in all 4 extremities. No focal weakness. Touch and pinprick sensation appear preserved bilaterally. Coordination is accurate. Reflexes are symmetric.  Plantars are downgoing. Gait was not tested. ASSESSMENT/PLAN Maxwell Clark is a 58 y.o. male with history of diabetes presenting with new onset expressive aphasia. He did not receive IV t-PA due to delay in arrival.   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 Wall thickness was normal. Systolic function was normal. The estimated ejection fraction was in the range of 60% to 65%. Wall motion was normal; there were no regional wall motion abnormalities. Doppler parameters are consistent with abnormal left ventricular relaxation (grade 1 diastolic dysfunction TEE to look for embolic source. Arranged with [facility] for Friday (unable to do today as schedule full; tomorrow is Thanksgiving and they will be closed).  If positive for PFO (patent foramen ovale), check bilateral lower extremity venous dopplers to rule out DVT as possible source of stroke. (I have made patient NPO after midnight Thurs night).  Will not consider loop until rest of workup reviewed Consider hypercoagulable labs pending workup results  Lovenox 40 mg sq daily for VTE prophylaxis  DIET DYS 3. For MBSS today with ST  no antithrombotics prior to admission, placed on aspirin 300 mg suppository daily in hospital. Given moderate hemorrhagic transformation   Ongoing aggressive stroke risk factor management  Therapy recommendations:  pending   Disposition:  pending   Hyperlipidemia  Home meds:  No statin  LDL 115, goal < 70  Add statin once cleared to  swallow  Diabetes  HgbA1c 8.1, goal < 7.0  Uncontrolled  Other Stroke Risk Factors  ETOH use  Hospital day # 3  SHARON BIBY, MSN, APRN, ANVP-BC, AGPCNP-BC [facility] Pager: [phone] 10/18/2014 1:49 PM  I have personally examined this patient, reviewed notes, independently viewed imaging studies, participated in medical decision making and plan of care. I have made any additions or clarifications directly to the above note. Agree with note above. Suspected left MCA branch infarct of embolic etiology.source yet undetermined.  TEE and loop recorder completed. DC home  On aspirin 81 mg daily and f/u in stroke clinic in 4 weeks. May consider  RESPECT ESUS trial  Delia HeadyPramod Caryl Manas, MD Medical Director [facility] Pager: [phone] 10/18/2014 1:49 PM   To contact Stroke Continuity provider, please refer to [email or web address]. After hours, contact General Neurology

## 2014-10-18 NOTE — Plan of Care (Signed)
Problem: Discharge/Transitional Outcomes Goal: Barriers To Progression Addressed/Resolved Outcome: Completed/Met Date Met:  10/18/14 Goal: Educational Plan Complete Outcome: Completed/Met Date Met:  10/18/14 Goal: Hemodynamically stable Outcome: Completed/Met Date Met:  10/18/14 Goal: Independent mobility/functioning independent or with min Independent mobility/functioning independently or with minimal assistance  Outcome: Completed/Met Date Met:  10/18/14 Goal: Tolerating diet/TF at goal rate-PEG if inadequate intake Outcome: Completed/Met Date Met:  10/18/14 Goal: INR monitor plan established Outcome: Completed/Met Date Met:  10/18/14 Goal: Family and patient agree upon discharge plan Outcome: Completed/Met Date Met:  10/18/14 Goal: Family/Caregiver willing and able to support plan Family/Caregiver willing and able to support plan for self-management after transition home  Outcome: Completed/Met Date Met:  10/18/14 Goal: PCP appointment made and transportation plan in place Outcome: Completed/Met Date Met:  10/18/14 Goal: Ability to attain medications upon leaving hospital Outcome: Completed/Met Date Met:  10/18/14 Goal: Other Discharge Outcomes/Goals Outcome: Completed/Met Date Met:  10/18/14

## 2014-10-18 NOTE — Progress Notes (Signed)
10/16/14 1000  SLP Visit Information  SLP Received On 10/16/14  SLP Time Calculation  SLP Start Time (ACUTE ONLY) 0909  SLP Stop Time (ACUTE ONLY) 0945  SLP Time Calculation (min) (ACUTE ONLY) 36 min  General Information  HPI Maxwell Clark is a 58 y.o. male with history of diabetes mellitus type 2 on insulin started experiencing some difficulty speaking since morning. As the day progressed patient's symptoms got worse and by evening patient was taken to [facility] over there CT head done showed evolving stroke in the left parietal region and patient was transferred to [facility] further management. On-call neurologist Dr. Noel Christmasharles Stewart was consulted at this time neurologist felt that patient was not a candidate for TPA. On exam patient is having right facial droop and difficulty to speak. Patient has expressive aphasia. Patient's tongue is deviated to the right.  MRI pending.   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)  Pain Assessment  Pain Assessment No/denies pain  Cognition  Overall Cognitive Status Within Functional Limits for tasks assessed  Arousal/Alertness Awake/alert  Orientation Level Oriented X4 (via Y/N)  Attention Focused;Sustained;Alternating;Divided  Focused Attention Appears intact  Sustained Attention Appears intact  Alternating Attention Appears intact  Divided Attention Appears intact  Memory (NT due to language deficits)  Awareness Appears intact  Problem Solving Impaired  Problem Solving Impairment Verbal basic  Safety/Judgment Appears intact  Auditory Comprehension  Overall Auditory Comprehension Impaired  Yes/No Questions X  Basic Biographical Questions 76-100% accurate  Complex Questions 0-24% accurate  Commands X  One Step Basic Commands 75-100% accurate  Two Step Basic Commands 75-100% accurate  Complex Commands 25-49%  accurate  Conversation Simple  Reading Comprehension  Reading Status (WFL at word level)  Expression  Primary Mode of Expression Nonverbal - gestures  Verbal Expression  Overall Verbal Expression Impaired  Initiation Impaired  Automatic Speech Counting;Name (with max MIT cueing)  Level of Generative/Spontaneous Verbalization (unable)  Repetition Impaired  Level of Impairment Word level  Naming Impairment  Confrontation Impaired  Verbal Errors Aware of errors  Pragmatics No impairment  Effective Techniques Melodic intonation  Non-Verbal Means of Communication Writing;Drawing;Gestures (Y/N to simple questions)  Written Expression  Dominant Hand Right  Oral Motor/Sensory Function  Overall Oral Motor/Sensory Function Impaired  Labial ROM Reduced right  Labial Symmetry Abnormal symmetry right  Labial Strength Reduced  Labial Sensation WFL  Lingual ROM Reduced right  Lingual Symmetry Abnormal symmetry right  Lingual Strength Reduced  Lingual Sensation Reduced  Facial ROM Reduced right  Facial Symmetry Right droop  Facial Strength Reduced  Facial Sensation WFL  Mandible WFL  Motor Speech  Overall Motor Speech Impaired  Respiration WFL  Phonation Low vocal intensity  Resonance WFL  Articulation Impaired  Level of Impairment Word  Intelligibility Intelligibility reduced  Word 0-24% accurate  Sentence 0-24% accurate  Motor Planning Impaired  Level of Impairment Word  Motor Speech Errors Aware;Groping for words  SLP - End of Session  Patient left in bed  Assessment  Clinical Impression Statement Pt demonstrates a mild to moderate receptive aphasia, pt is able to comprehend single words, but comprehension declines with language complexity. He also demonstrates a moderate expressive aphasia complicated by verbal apraxia. The pt can again write some single words with moderate assist and can write his name independently, but cannot name or repeat and any attempts with melodic  intonation result  in minimal articulation, mostly just phonatory attempts with some intonation ability. Pt will benefit from intensive speech therapy to maximize functional communication. Will continue to treat pt acutely, but he will need outpatient f/u as soon as possible after d/c.   SLP Recommendation/Assessment Patient needs continued Speech Lanaguage Pathology Services  Problem List Auditory comprehension;Reading comprehension;Written expression;Verbal expression  Plan  Speech Therapy Frequency (ACUTE ONLY) min 3x week  Duration 2 weeks  Treatment/Interventions Language facilitation;Cueing hierarchy;Functional tasks;SLP instruction and feedback;Compensatory strategies;Patient/family education  Potential to Achieve Goals (ACUTE ONLY) Good  SLP Recommendations  Follow up Recommendations Outpatient SLP  Individuals Consulted  Consulted and Agree with Results and Recommendations Patient;Family member/caregiver  Family Member Consulted  wife, sister  SLP Evaluations  $ SLP Speech Visit 1 Procedure  SLP Evaluations  $Speech Treatment for Individual 1 Procedure  $ SLP EVAL LANGUAGE/SOUND PRODUCTION 1 Procedure

## 2014-10-18 NOTE — Consult Note (Signed)
ELECTROPHYSIOLOGY CONSULT NOTE  Patient ID: Maxwell Clark MRN: [id number], DOB/AGE: [birth year]   Admit date: 10/15/2014 Date of Consult: 10/18/2014  Primary Physician: Elizabeth SauerJONES,DEANNA, MD Primary Cardiologist: new1 Reason for Consultation: Cryptogenic stroke; recommendations regarding Implantable Loop Recorder  History of Present Illness   Maxwell Clark was admitted on 10/15/2014 with  nonverbal.  Imaging demonstrated Evolving stroke *.  he has undergone workup for stroke including echocardiogram and carotid dopplers.  The patient has been monitored on telemetry which has demonstrated sinus rhythm with no arrhythmias.  Inpatient stroke work-up is to be completed with a TEE.   Echocardiogram this admission demonstrated  Normal lv function and no CSE Lab work is remarkable for diabetes.  Prior to admission, the patient denies chest pain, shortness of breath, dizziness, palpitations, or syncope.  They are recovering from their stroke with plans to go home  at discharge.  EP has been asked to evaluate for placement of an implantable loop recorder to monitor for atrial fibrillation.  ROS is negative except as outlined above.    Past Medical History  Diagnosis Date  . Diabetes mellitus without complication   . Stroke 10/15/2014     Surgical History: History reviewed. No pertinent past surgical history.   Prescriptions prior to admission  Medication Sig Dispense Refill Last Dose  . insulin glargine (LANTUS) 100 UNIT/ML injection Inject 20 Units into the skin at bedtime.   10/14/2014 at Unknown time    Inpatient Medications:  . [MAR Hold] antiseptic oral rinse  7 mL Mouth Rinse BID  . [MAR Hold] atorvastatin  80 mg Oral q1800  . [MAR Hold] enoxaparin (LOVENOX) injection  40 mg Subcutaneous Q24H  . [MAR Hold] insulin aspart  0-9 Units Subcutaneous Q4H  . [MAR Hold] insulin glargine  10 Units Subcutaneous QHS  . [MAR Hold] pneumococcal 23 valent vaccine  0.5 mL Intramuscular  Tomorrow-1000    Allergies:  Allergies  Allergen Reactions  . Penicillins     unknown    History   Social History  . Marital Status: Married    Spouse Name: N/A    Number of Children: N/A  . Years of Education: N/A   Occupational History  . Not on file.   Social History Main Topics  . Smoking status: Never Smoker   . Smokeless tobacco: Former NeurosurgeonUser    Types: Chew     Comment: quit around 2012  . Alcohol Use: Yes     Comment: Occasional   . Drug Use: No  . Sexual Activity: Not on file   Other Topics Concern  . Not on file   Social History Narrative     Family History  Problem Relation Age of Onset  . Atrial fibrillation Mother   . Hypertension Mother   . Lung cancer Father   . Kidney cancer Father      Physical Exam: Filed Vitals:   10/18/14 1010 10/18/14 1015 10/18/14 1020 10/18/14 1034  BP: 163/79 170/88 159/80 143/72  Pulse: 76 73 72 75  Temp:      TempSrc:      Resp: 22 16 19 17   Height:      Weight:      SpO2: 97% 94% 96% 95%    GEN- The patient is well appearing, alert and oriented x 3 today.   Head- normocephalic, atraumatic Eyes-  Sclera clear, conjunctiva pink Ears- hearing intact Oropharynx- clear Neck- supple, Lungs- Clear to ausculation bilaterally, normal work of breathing Heart- Regular rate  and rhythm, no murmurs, rubs or gallops, PMI not laterally displaced GI- soft, NT, ND, + BS Extremities- no clubbing, cyanosis, or edema MS- no significant deformity or atrophy Skin- no rash or lesion Psych- euthymic mood, full affect Neuro  Aphasia  But communicates w nods    Labs:   Lab Results  Component Value Date   WBC 8.6 10/15/2014   HGB 15.5 10/15/2014   HCT 44.8 10/15/2014   MCV 87.7 10/15/2014   PLT 243 10/15/2014    Recent Labs Lab 10/15/14 2226  NA 141  K 4.2  CL 105  CO2 23  BUN 12  CREATININE 0.84  CALCIUM 8.9  PROT 7.1  BILITOT 0.5  ALKPHOS 71  ALT 21  AST 21  GLUCOSE 174*   Lab Results  Component  Value Date   TROPONINI <0.30 10/15/2014   Lab Results  Component Value Date   CHOL 208* 10/15/2014   Lab Results  Component Value Date   HDL 83 10/15/2014   Lab Results  Component Value Date   LDLCALC 115* 10/15/2014   Lab Results  Component Value Date   TRIG 52 10/15/2014   Lab Results  Component Value Date   CHOLHDL 2.5 10/15/2014   No results found for: LDLDIRECT  No results found for: DDIMER   Radiology/Studies: Mr Brain Wo Contrast  10/16/2014   CLINICAL DATA:  Stroke.  Diabetes.  Expressive aphasia.  EXAM: MRI HEAD WITHOUT CONTRAST  MRA HEAD WITHOUT CONTRAST  TECHNIQUE: Multiplanar, multiecho pulse sequences of the brain and surrounding structures were obtained without intravenous contrast. Angiographic images of the head were obtained using MRA technique without contrast.  COMPARISON:  CT head 10/15/2014  FINDINGS: MRI HEAD FINDINGS  Acute hemorrhagic infarct in the left frontal operculum extending into the insular cortex. Infarct measures approximately 3 x 5 cm. No other acute infarct identified  Ventricle size is normal. No shift of the midline structures. No significant chronic ischemic change. Cerebral white matter and brainstem are normal.  Negative for mass lesion.  MRA HEAD FINDINGS  Both vertebral arteries are patent to the basilar. PICA patent bilaterally. Basilar widely patent. Superior cerebellar arteries are patent bilaterally. Fetal origin of the posterior cerebral artery bilaterally with hypoplastic P1 segments bilaterally.  Internal carotid artery is patent bilaterally without significant stenosis.  Right anterior and right middle cerebral arteries are widely patent without stenosis or occlusion.  Left anterior cerebral artery widely patent.  Left M1 segment widely patent. There is a stenosis at the left M1 bifurcation. Mild to moderate stenosis in the temporal branch of the left middle cerebral artery.  IMPRESSION: Acute hemorrhagic infarct in the left frontal  operculum and left insular cortex. No other significant ischemic change.  Mild to moderate stenosis of the left MCA bifurcation extending into the temporal branch of the left MCA. This is less severe than would be expected for acute infarct and could represent some residual thrombus. Alternately, an embolus could be the cause of this infarction.   Electronically Signed   By: Marlan Palau M.D.   On: 10/16/2014 09:54   Dg Chest Port 1 View  10/15/2014   CLINICAL DATA:  Stroke, nonverbal.  EXAM: PORTABLE CHEST - 1 VIEW  COMPARISON:  None.  FINDINGS: The heart size and mediastinal contours are within normal limits. Both lungs are clear. The visualized skeletal structures are unremarkable.  IMPRESSION: No active disease.   Electronically Signed   By: Awilda Metro   On: 10/15/2014 23:14  Dg Swallowing Func-speech Pathology  10/16/2014   Carolan Shiver, CCC-SLP     10/16/2014  2:47 PM Objective Swallowing Evaluation: Modified Barium Swallowing Study   Patient Details  Name: Maxwell Clark MRN: [id number] Date of Birth: 1956-05-05  Today's Date: 10/16/2014 Time: 1315-1350 SLP Time Calculation (min) (ACUTE ONLY): 35 min  Past Medical History:  Past Medical History  Diagnosis Date  . Diabetes mellitus without complication   . Stroke 10/15/2014   Past Surgical History: History reviewed. No pertinent past  surgical history. HPI:  Maxwell Clark is a 58 y.o. male with history of diabetes  mellitus type 2 on insulin started experiencing some difficulty  speaking since morning. As the day progressed patient's symptoms  got worse and by evening patient was taken to [facility] over there CT head done showed evolving stroke in the  left parietal region and patient was transferred to [facility] for further management. On-call neurologist Dr. Noel Christmas was consulted at this time neurologist felt that patient  was not a candidate for TPA. On exam patient is having right   facial droop and difficulty to speak. Patient has expressive  aphasia. Patient's tongue is deviated to the right.  MRI Acute  hemorrhagic infarct in the left frontal operculum and left  insular cortex.     Assessment / Plan / Recommendation Clinical Impression  Clinical impression: Pt presents with a mild oropharyngeal  dysphagia characterized by: 1) neurosensory impairments leading  to poor oral containment with anterior and posterior bolus loss;  2) trace, silent aspiration of thin liquids when consumed in  large quantities.  When pt consumes limited, small sips of  liquid, he is able to protect his airway.  A head turn to the  left was also protective, but no more effective than limiting  bolus size.  Pharyngeal clearance was excellent with no residue  for any consistencies.  Spoke with pt and his family at length  re: results of exam, recommendations, strategies to maintain  safety.  Family had many questions; we discussed dysphagia and  aphasia, prognosis, and communication strategies.  Recommend  dysphagia 3 diet with thin liquids; meds whole with puree.  SLP  will continue to follow for dysphagia/aphasia.     Treatment Recommendation  Therapy as outlined in treatment plan below    Diet Recommendation Dysphagia 3 (Mechanical Soft);Thin liquid   Liquid Administration via: Cup Medication Administration: Whole meds with puree Supervision: Patient able to self feed;Full supervision/cueing  for compensatory strategies (initally to ensure safety) Compensations: Slow rate;Small sips/bites;Check for pocketing Postural Changes and/or Swallow Maneuvers: Seated upright 90  degrees    Other  Recommendations Oral Care Recommendations: Oral care BID   Follow Up Recommendations  Outpatient SLP    Frequency and Duration min 3x week  2 weeks       SLP Swallow Goals     General Date of Onset: 10/15/14 HPI: Maxwell Clark is a 58 y.o. male with history of diabetes  mellitus type 2 on insulin started experiencing some  difficulty  speaking since morning. As the day progressed patient's symptoms  got worse and by evening patient was taken to [facility] over there CT head done showed evolving stroke in the  left parietal region and patient was transferred to [facility] for further management. On-call neurologist Dr. Noel Christmas was consulted at this time neurologist felt that patient  was not a candidate for  TPA. On exam patient is having right  facial droop and difficulty to speak. Patient has expressive  aphasia. Patient's tongue is deviated to the right.  MRI Acute  hemorrhagic infarct in the left frontal operculum and left Type of Study: Modified Barium Swallowing Study Reason for Referral: Objectively evaluate swallowing function Diet Prior to this Study: NPO Temperature Spikes Noted: No Respiratory Status: Room air History of Recent Intubation: No Behavior/Cognition: Alert;Cooperative;Pleasant mood Oral Cavity - Dentition: Adequate natural dentition Oral Motor / Sensory Function: Impaired - see Bedside swallow  eval Self-Feeding Abilities: Able to feed self Patient Positioning: Upright in chair Baseline Vocal Quality: Low vocal intensity Volitional Cough: Weak Volitional Swallow: Able to elicit Anatomy: Within functional limits Pharyngeal Secretions: Not observed secondary MBS    Reason for Referral Objectively evaluate swallowing function   Oral Phase Oral Preparation/Oral Phase Oral Phase: Impaired Oral - Nectar Oral - Nectar Cup: Right anterior bolus loss;Piecemeal swallowing Oral - Thin Oral - Thin Cup: Right anterior bolus loss;Piecemeal swallowing Oral - Solids Oral - Puree: Weak lingual manipulation Oral - Mechanical Soft: Weak lingual manipulation Oral Phase - Comment Oral Phase - Comment: oral loss of liquids right side   Pharyngeal Phase Pharyngeal Phase Pharyngeal Phase: Impaired Pharyngeal - Nectar Pharyngeal - Nectar Cup: Premature spillage to pyriform sinuses Pharyngeal - Thin  Pharyngeal - Thin Cup: Premature spillage to pyriform  sinuses;Compensatory strategies attempted (Comment);Trace  aspiration;Penetration/Aspiration during swallow (head turn to  left as effective as limiting bolus size) Penetration/Aspiration details (thin cup): Material enters  airway, passes BELOW cords without attempt by patient to eject  out (silent aspiration) Pharyngeal - Solids Pharyngeal - Puree: Premature spillage to valleculae Pharyngeal - Regular: Premature spillage to valleculae  Cervical Esophageal Phase   Amanda L. Samson Fredericouture, [place] CCC/SLP Pager [phone]               Blenda MountsCouture, Amanda Laurice 10/16/2014, 2:45 PM    Mr Maxine GlennMra Head/brain Wo Cm  10/16/2014   CLINICAL DATA:  Stroke.  Diabetes.  Expressive aphasia.  EXAM: MRI HEAD WITHOUT CONTRAST  MRA HEAD WITHOUT CONTRAST  TECHNIQUE: Multiplanar, multiecho pulse sequences of the brain and surrounding structures were obtained without intravenous contrast. Angiographic images of the head were obtained using MRA technique without contrast.  COMPARISON:  CT head 10/15/2014  FINDINGS: MRI HEAD FINDINGS  Acute hemorrhagic infarct in the left frontal operculum extending into the insular cortex. Infarct measures approximately 3 x 5 cm. No other acute infarct identified  Ventricle size is normal. No shift of the midline structures. No significant chronic ischemic change. Cerebral white matter and brainstem are normal.  Negative for mass lesion.  MRA HEAD FINDINGS  Both vertebral arteries are patent to the basilar. PICA patent bilaterally. Basilar widely patent. Superior cerebellar arteries are patent bilaterally. Fetal origin of the posterior cerebral artery bilaterally with hypoplastic P1 segments bilaterally.  Internal carotid artery is patent bilaterally without significant stenosis.  Right anterior and right middle cerebral arteries are widely patent without stenosis or occlusion.  Left anterior cerebral artery widely patent.  Left M1 segment widely patent. There  is a stenosis at the left M1 bifurcation. Mild to moderate stenosis in the temporal branch of the left middle cerebral artery.  IMPRESSION: Acute hemorrhagic infarct in the left frontal operculum and left insular cortex. No other significant ischemic change.  Mild to moderate stenosis of the left MCA bifurcation extending into the temporal branch of the left MCA. This is less severe than would be expected for  acute infarct and could represent some residual thrombus. Alternately, an embolus could be the cause of this infarction.   Electronically Signed   By: Marlan Palau M.D.   On: 10/16/2014 09:54    12-lead ECG none   Telemetry nsr Assessment and Plan:  1. Cryptogenic stroke The patient presents with cryptogenic stroke.  The patient has a TEE planned for this AM.  I spoke at length with the patient about monitoring for afib with either a 30 day event monitor or an implantable loop recorder.  Risks, benefits, and alteratives to implantable loop recorder were discussed with the patient today.   At this time, the patient is very clear in their decision to proceed with implantable loop recorder.   Wound care was reviewed with the patient (keep incision clean and dry for 3 days).  Wound check scheduled office will call  Please call with questions.

## 2014-10-18 NOTE — Discharge Summary (Signed)
Physician Discharge Summary  Maxwell Clark ZOX:[id number] DOB: January 26, 1956 DOA: 10/15/2014  PCP: Elizabeth Sauer, MD  Admit date: 10/15/2014 Discharge date: 10/18/2014  Time spent: 35 minutes  Recommendations for Outpatient Follow-up:  1. D/c home with outpt speech evalaution. 2. Follow up with PCP in 1 week and neurology ( Dr Pearlean Brownie) in 2 months  Discharge Diagnoses:  Principal Problem:   Acute ischemic stroke  Active Problems:   Aphasia due to stroke   Diabetes mellitus type 2, uncontrolled   Dyslipidemia, goal LDL below 70   Discharge Condition: fair  Diet recommendation: mechanical soft  Filed Weights   10/15/14 2340 10/17/14 0451 10/18/14 0435  Weight: 85.911 kg (189 lb 6.4 oz) 86.183 kg (190 lb) 85.186 kg (187 lb 12.8 oz)    History of present illness:  58 year old male with type 2 diabetes mellitus on insulin presented with acute onset of difficulty speaking on the morning of admission. As the day progressed his symptoms became worse and was taken to [facility] ED. Head CT done over there showed evolving stroke in the left parietal area and patient was transferred to [facility] for further management. Patient was out of therapeutic window for TPA so did not receive TPA. Patient on exam had right facial droop and was unable to speak severe expressive aphagia and deviation of the tongue to the right. Patient had good strength in all his extremities. Admitted to telemetry for stroke workup.  Hospital Course:  Acute ischemic/embolic stroke MRI of the brain showing Acute hemorrhagic infarct in the left frontal operculum extending into the insular cortex. ( acute ischemic stroke with hemorrhagic trasnformation).   -Seen by speech and swallow therapy. Patient still unable to speak but has cleared swallowing evaluation. MBS done shows mild oropharyngeal dysphagia. Ordered mechanical soft diet and tolerating well.  -Elevated LDL noted and started on  Lipitor 80 mg daily. -vitals stable. Patient has no other residual motor or sensory impairment. -Given concern for embolic stroke , TEE was performed which was normal . A loop recorder was then implanted by EP --Carotid Doppler without significant carotid stenosis. 2-D echo with nomral EF, no WMA and normal valves. -Appreciate neurology recommendations. Given embolic stroke with hemorrhagic transformation, he will be discharged on low dose aspirin 81 mg daily and followed as outpt. Prescribed lipitor 80 mg daily.  emphasized on secondary stroke prevention and lifestyle modifications. Follow up with outpt speech therapy will be arranged.  Type 2 diabetes mellitus, uncontrolled A1c of 8.1. Resumed home dose Lantus  counseled on weight loss and exercise.  Cannabis use counseled on cessation.    Diet: Mechanical soft     Code Status: Full code Family Communication: Wife at bedside  Disposition Plan: Home    Consultants:  Neurology  cardiology /EP  Procedures: MRI brain Carotid Doppler 2-D echo TEE  Implantable loop recorder placement   Antibiotics:  NONE  Discharge Exam: Filed Vitals:   10/18/14 1210  BP: 121/63  Pulse: 56  Temp: 98 F (36.7 C)  Resp: 16     General: NAD  HEENT: right facial droop,   Cardiovascular: NS1&S2, no murmurs  Respiratory: clear b/l, no added sounds  Abdomen: soft, NT ND BS+  Musculoskeletal: warm, no edema  CNS: rt facial droop with tongue deviation to the right, aphasic, normal power and tone b/l  Discharge Instructions You were cared for by a hospitalist during your hospital stay. If you have any questions about your discharge medications or the care you received  while you were in the hospital after you are discharged, you can call the unit and asked to speak with the hospitalist on call if the hospitalist that took care of you is not available. Once you are discharged, your primary care physician will handle any further  medical issues. Please note that NO REFILLS for any discharge medications will be authorized once you are discharged, as it is imperative that you return to your primary care physician (or establish a relationship with a primary care physician if you do not have one) for your aftercare needs so that they can reassess your need for medications and monitor your lab values.   Current Discharge Medication List    START taking these medications   Details  aspirin EC 81 MG tablet Take 1 tablet (81 mg total) by mouth daily. Qty: 30 tablet, Refills: 0    atorvastatin (LIPITOR) 80 MG tablet Take 1 tablet (80 mg total) by mouth daily at 6 PM. Qty: 30 tablet, Refills: 0      CONTINUE these medications which have NOT CHANGED   Details  insulin glargine (LANTUS) 100 UNIT/ML injection Inject 20 Units into the skin at bedtime.       Allergies  Allergen Reactions  . Penicillins     unknown   Follow-up Information    [facility]   Specialty:  Rehabilitation   Why:  Speech Therapy   Contact information:   [address] [id number] mc [place] [id number] [phone]      [facility] In 1 week.   Specialty:  Family Medicine   Contact information:   [address] [place] [address] [phone]       Follow up with SETHI,PRAMOD, MD. Schedule an appointment as soon as possible for a visit in 2 months.   Specialties:  Neurology, Radiology   Contact information:   [address] [place] [address] [phone]        The results of significant diagnostics from this hospitalization (including imaging, microbiology, ancillary and laboratory) are listed below for reference.    Significant Diagnostic Studies: Mr Brain Wo Contrast  10/16/2014   CLINICAL DATA:  Stroke.  Diabetes.  Expressive aphasia.  EXAM: MRI HEAD WITHOUT CONTRAST  MRA HEAD WITHOUT CONTRAST   TECHNIQUE: Multiplanar, multiecho pulse sequences of the brain and surrounding structures were obtained without intravenous contrast. Angiographic images of the head were obtained using MRA technique without contrast.  COMPARISON:  CT head 10/15/2014  FINDINGS: MRI HEAD FINDINGS  Acute hemorrhagic infarct in the left frontal operculum extending into the insular cortex. Infarct measures approximately 3 x 5 cm. No other acute infarct identified  Ventricle size is normal. No shift of the midline structures. No significant chronic ischemic change. Cerebral white matter and brainstem are normal.  Negative for mass lesion.  MRA HEAD FINDINGS  Both vertebral arteries are patent to the basilar. PICA patent bilaterally. Basilar widely patent. Superior cerebellar arteries are patent bilaterally. Fetal origin of the posterior cerebral artery bilaterally with hypoplastic P1 segments bilaterally.  Internal carotid artery is patent bilaterally without significant stenosis.  Right anterior and right middle cerebral arteries are widely patent without stenosis or occlusion.  Left anterior cerebral artery widely patent.  Left M1 segment widely patent. There is a stenosis at the left M1 bifurcation. Mild to moderate stenosis in the temporal branch of the left middle cerebral artery.  IMPRESSION: Acute hemorrhagic infarct  in the left frontal operculum and left insular cortex. No other significant ischemic change.  Mild to moderate stenosis of the left MCA bifurcation extending into the temporal branch of the left MCA. This is less severe than would be expected for acute infarct and could represent some residual thrombus. Alternately, an embolus could be the cause of this infarction.   Electronically Signed   By: Marlan Palauharles  Clark M.D.   On: 10/16/2014 09:54   Dg Chest Port 1 View  10/15/2014   CLINICAL DATA:  Stroke, nonverbal.  EXAM: PORTABLE CHEST - 1 VIEW  COMPARISON:  None.  FINDINGS: The heart size and mediastinal contours are  within normal limits. Both lungs are clear. The visualized skeletal structures are unremarkable.  IMPRESSION: No active disease.   Electronically Signed   By: Awilda Metroourtnay  Bloomer   On: 10/15/2014 23:14   Dg Swallowing Func-speech Pathology  10/16/2014   Carolan ShiverAmanda Laurice Couture, CCC-SLP     10/16/2014  2:47 PM Objective Swallowing Evaluation: Modified Barium Swallowing Study   Patient Details  Name: Marguerite OleaDavid G Kliethermes MRN: [id number] Date of Birth: [birth year]  Today's Date: 10/16/2014 Time: 1315-1350 SLP Time Calculation (min) (ACUTE ONLY): 35 min  Past Medical History:  Past Medical History  Diagnosis Date  . Diabetes mellitus without complication   . Stroke 10/15/2014   Past Surgical History: History reviewed. No pertinent past  surgical history. HPI:  Marguerite OleaDavid G Broadwell is a 58 y.o. male with history of diabetes  mellitus type 2 on insulin started experiencing some difficulty  speaking since morning. As the day progressed patient's symptoms  got worse and by evening patient was taken to [facility] over there CT head done showed evolving stroke in the  left parietal region and [facility]. On-call neurologist Dr. Noel Christmasharles  Stewart was consulted at this time neurologist felt that patient  was not a candidate for TPA. On exam patient is having right  facial droop and difficulty to speak. Patient has expressive  aphasia. Patient's tongue is deviated to the right.  MRI Acute  hemorrhagic infarct in the left frontal operculum and left  insular cortex.     Assessment / Plan / Recommendation Clinical Impression  Clinical impression: Pt presents with a mild oropharyngeal  dysphagia characterized by: 1) neurosensory impairments leading  to poor oral containment with anterior and posterior bolus loss;  2) trace, silent aspiration of thin liquids when consumed in  large quantities.  When pt consumes limited, small sips of  liquid, he is able to protect his  airway.  A head turn to the  left was also protective, but no more effective than limiting  bolus size.  Pharyngeal clearance was excellent with no residue  for any consistencies.  Spoke with pt and his family at length  re: results of exam, recommendations, strategies to maintain  safety.  Family had many questions; we discussed dysphagia and  aphasia, prognosis, and communication strategies.  Recommend  dysphagia 3 diet with thin liquids; meds whole with puree.  SLP  will continue to follow for dysphagia/aphasia.     Treatment Recommendation  Therapy as outlined in treatment plan below    Diet Recommendation Dysphagia 3 (Mechanical Soft);Thin liquid   Liquid Administration via: Cup Medication Administration: Whole meds with puree Supervision: Patient able to self feed;Full supervision/cueing  for compensatory strategies (initally to ensure safety) Compensations: Slow rate;Small sips/bites;Check for pocketing Postural Changes and/or Swallow Maneuvers: Seated upright  90  degrees    Other  Recommendations Oral Care Recommendations: Oral care BID   Follow Up Recommendations  Outpatient SLP    Frequency and Duration min 3x week  2 weeks       SLP Swallow Goals     General Date of Onset: 10/15/14 HPI: CINCH ORMOND is a 58 y.o. male with history of diabetes  mellitus type 2 on insulin started experiencing some difficulty  speaking since morning. As the day progressed patient's symptoms  got worse and by evening patient was taken to [facility]  hospital over there CT head done showed evolving stroke in the  left parietal region and patient was transferred to [facility] for further management. On-call neurologist Dr. Noel Christmas was consulted at this time neurologist felt that patient  was not a candidate for TPA. On exam patient is having right  facial droop and difficulty to speak. Patient has expressive  aphasia. Patient's tongue is deviated to the right.  MRI Acute  hemorrhagic infarct in the  left frontal operculum and left Type of Study: Modified Barium Swallowing Study Reason for Referral: Objectively evaluate swallowing function Diet Prior to this Study: NPO Temperature Spikes Noted: No Respiratory Status: Room air History of Recent Intubation: No Behavior/Cognition: Alert;Cooperative;Pleasant mood Oral Cavity - Dentition: Adequate natural dentition Oral Motor / Sensory Function: Impaired - see Bedside swallow  eval Self-Feeding Abilities: Able to feed self Patient Positioning: Upright in chair Baseline Vocal Quality: Low vocal intensity Volitional Cough: Weak Volitional Swallow: Able to elicit Anatomy: Within functional limits Pharyngeal Secretions: Not observed secondary MBS    Reason for Referral Objectively evaluate swallowing function   Oral Phase Oral Preparation/Oral Phase Oral Phase: Impaired Oral - Nectar Oral - Nectar Cup: Right anterior bolus loss;Piecemeal swallowing Oral - Thin Oral - Thin Cup: Right anterior bolus loss;Piecemeal swallowing Oral - Solids Oral - Puree: Weak lingual manipulation Oral - Mechanical Soft: Weak lingual manipulation Oral Phase - Comment Oral Phase - Comment: oral loss of liquids right side   Pharyngeal Phase Pharyngeal Phase Pharyngeal Phase: Impaired Pharyngeal - Nectar Pharyngeal - Nectar Cup: Premature spillage to pyriform sinuses Pharyngeal - Thin Pharyngeal - Thin Cup: Premature spillage to pyriform  sinuses;Compensatory strategies attempted (Comment);Trace  aspiration;Penetration/Aspiration during swallow (head turn to  left as effective as limiting bolus size) Penetration/Aspiration details (thin cup): Material enters  airway, passes BELOW cords without attempt by patient to eject  out (silent aspiration) Pharyngeal - Solids Pharyngeal - Puree: Premature spillage to valleculae Pharyngeal - Regular: Premature spillage to valleculae  Cervical Esophageal Phase   Amanda L. Samson Frederic, [place] CCC/SLP Pager [phone]               Blenda Mounts Laurice 10/16/2014,  2:45 PM    Mr Maxine Glenn Head/brain Wo Cm  10/16/2014   CLINICAL DATA:  Stroke.  Diabetes.  Expressive aphasia.  EXAM: MRI HEAD WITHOUT CONTRAST  MRA HEAD WITHOUT CONTRAST  TECHNIQUE: Multiplanar, multiecho pulse sequences of the brain and surrounding structures were obtained without intravenous contrast. Angiographic images of the head were obtained using MRA technique without contrast.  COMPARISON:  CT head 10/15/2014  FINDINGS: MRI HEAD FINDINGS  Acute hemorrhagic infarct in the left frontal operculum extending into the insular cortex. Infarct measures approximately 3 x 5 cm. No other acute infarct identified  Ventricle size is normal. No shift of the midline structures. No significant chronic ischemic change. Cerebral white matter and brainstem are normal.  Negative for mass  lesion.  MRA HEAD FINDINGS  Both vertebral arteries are patent to the basilar. PICA patent bilaterally. Basilar widely patent. Superior cerebellar arteries are patent bilaterally. Fetal origin of the posterior cerebral artery bilaterally with hypoplastic P1 segments bilaterally.  Internal carotid artery is patent bilaterally without significant stenosis.  Right anterior and right middle cerebral arteries are widely patent without stenosis or occlusion.  Left anterior cerebral artery widely patent.  Left M1 segment widely patent. There is a stenosis at the left M1 bifurcation. Mild to moderate stenosis in the temporal branch of the left middle cerebral artery.  IMPRESSION: Acute hemorrhagic infarct in the left frontal operculum and left insular cortex. No other significant ischemic change.  Mild to moderate stenosis of the left MCA bifurcation extending into the temporal branch of the left MCA. This is less severe than would be expected for acute infarct and could represent some residual thrombus. Alternately, an embolus could be the cause of this infarction.   Electronically Signed   By: Marlan Palau M.D.   On: 10/16/2014 09:54     Microbiology: No results found for this or any previous visit (from the past 240 hour(s)).   Labs: Basic Metabolic Panel:  Recent Labs Lab 10/15/14 2226  NA 141  K 4.2  CL 105  CO2 23  GLUCOSE 174*  BUN 12  CREATININE 0.84  CALCIUM 8.9   Liver Function Tests:  Recent Labs Lab 10/15/14 2226  AST 21  ALT 21  ALKPHOS 71  BILITOT 0.5  PROT 7.1  ALBUMIN 3.8   No results for input(s): LIPASE, AMYLASE in the last 168 hours. No results for input(s): AMMONIA in the last 168 hours. CBC:  Recent Labs Lab 10/15/14 2226  WBC 8.6  NEUTROABS 6.0  HGB 15.5  HCT 44.8  MCV 87.7  PLT 243   Cardiac Enzymes:  Recent Labs Lab 10/15/14 2226  TROPONINI <0.30   BNP: BNP (last 3 results) No results for input(s): PROBNP in the last 8760 hours. CBG:  Recent Labs Lab 10/17/14 2017 10/18/14 0031 10/18/14 0433 10/18/14 0739 10/18/14 1157  GLUCAP 127* 137* 137* 141* 147*       Signed:  Judye Lorino  [facility] 10/18/2014, 1:27 PM

## 2014-10-18 NOTE — H&P (View-Only) (Signed)
ELECTROPHYSIOLOGY CONSULT NOTE  Patient ID: Marguerite OleaDavid G Kalt MRN: [id number], DOB/AGE: [birth year]   Admit date: 10/15/2014 Date of Consult: 10/18/2014  Primary Physician: Elizabeth SauerJONES,DEANNA, MD Primary Cardiologist: new1 Reason for Consultation: Cryptogenic stroke; recommendations regarding Implantable Loop Recorder  History of Present Illness   Marguerite OleaDavid G Liskey was admitted on 10/15/2014 with  nonverbal.  Imaging demonstrated Evolving stroke *.  he has undergone workup for stroke including echocardiogram and carotid dopplers.  The patient has been monitored on telemetry which has demonstrated sinus rhythm with no arrhythmias.  Inpatient stroke work-up is to be completed with a TEE.   Echocardiogram this admission demonstrated  Normal lv function and no CSE Lab work is remarkable for diabetes.  Prior to admission, the patient denies chest pain, shortness of breath, dizziness, palpitations, or syncope.  They are recovering from their stroke with plans to go home  at discharge.  EP has been asked to evaluate for placement of an implantable loop recorder to monitor for atrial fibrillation.  ROS is negative except as outlined above.    Past Medical History  Diagnosis Date  . Diabetes mellitus without complication   . Stroke 10/15/2014     Surgical History: History reviewed. No pertinent past surgical history.   Prescriptions prior to admission  Medication Sig Dispense Refill Last Dose  . insulin glargine (LANTUS) 100 UNIT/ML injection Inject 20 Units into the skin at bedtime.   10/14/2014 at Unknown time    Inpatient Medications:  . [MAR Hold] antiseptic oral rinse  7 mL Mouth Rinse BID  . [MAR Hold] atorvastatin  80 mg Oral q1800  . [MAR Hold] enoxaparin (LOVENOX) injection  40 mg Subcutaneous Q24H  . [MAR Hold] insulin aspart  0-9 Units Subcutaneous Q4H  . [MAR Hold] insulin glargine  10 Units Subcutaneous QHS  . [MAR Hold] pneumococcal 23 valent vaccine  0.5 mL Intramuscular  Tomorrow-1000    Allergies:  Allergies  Allergen Reactions  . Penicillins     unknown    History   Social History  . Marital Status: Married    Spouse Name: N/A    Number of Children: N/A  . Years of Education: N/A   Occupational History  . Not on file.   Social History Main Topics  . Smoking status: Never Smoker   . Smokeless tobacco: Former NeurosurgeonUser    Types: Chew     Comment: quit around 2012  . Alcohol Use: Yes     Comment: Occasional   . Drug Use: No  . Sexual Activity: Not on file   Other Topics Concern  . Not on file   Social History Narrative     Family History  Problem Relation Age of Onset  . Atrial fibrillation Mother   . Hypertension Mother   . Lung cancer Father   . Kidney cancer Father      Physical Exam: Filed Vitals:   10/18/14 1010 10/18/14 1015 10/18/14 1020 10/18/14 1034  BP: 163/79 170/88 159/80 143/72  Pulse: 76 73 72 75  Temp:      TempSrc:      Resp: 22 16 19 17   Height:      Weight:      SpO2: 97% 94% 96% 95%    GEN- The patient is well appearing, alert and oriented x 3 today.   Head- normocephalic, atraumatic Eyes-  Sclera clear, conjunctiva pink Ears- hearing intact Oropharynx- clear Neck- supple, Lungs- Clear to ausculation bilaterally, normal work of breathing Heart- Regular rate  and rhythm, no murmurs, rubs or gallops, PMI not laterally displaced GI- soft, NT, ND, + BS Extremities- no clubbing, cyanosis, or edema MS- no significant deformity or atrophy Skin- no rash or lesion Psych- euthymic mood, full affect Neuro  Aphasia  But communicates w nods    Labs:   Lab Results  Component Value Date   WBC 8.6 10/15/2014   HGB 15.5 10/15/2014   HCT 44.8 10/15/2014   MCV 87.7 10/15/2014   PLT 243 10/15/2014    Recent Labs Lab 10/15/14 2226  NA 141  K 4.2  CL 105  CO2 23  BUN 12  CREATININE 0.84  CALCIUM 8.9  PROT 7.1  BILITOT 0.5  ALKPHOS 71  ALT 21  AST 21  GLUCOSE 174*   Lab Results  Component  Value Date   TROPONINI <0.30 10/15/2014   Lab Results  Component Value Date   CHOL 208* 10/15/2014   Lab Results  Component Value Date   HDL 83 10/15/2014   Lab Results  Component Value Date   LDLCALC 115* 10/15/2014   Lab Results  Component Value Date   TRIG 52 10/15/2014   Lab Results  Component Value Date   CHOLHDL 2.5 10/15/2014   No results found for: LDLDIRECT  No results found for: DDIMER   Radiology/Studies: Mr Brain Wo Contrast  10/16/2014   CLINICAL DATA:  Stroke.  Diabetes.  Expressive aphasia.  EXAM: MRI HEAD WITHOUT CONTRAST  MRA HEAD WITHOUT CONTRAST  TECHNIQUE: Multiplanar, multiecho pulse sequences of the brain and surrounding structures were obtained without intravenous contrast. Angiographic images of the head were obtained using MRA technique without contrast.  COMPARISON:  CT head 10/15/2014  FINDINGS: MRI HEAD FINDINGS  Acute hemorrhagic infarct in the left frontal operculum extending into the insular cortex. Infarct measures approximately 3 x 5 cm. No other acute infarct identified  Ventricle size is normal. No shift of the midline structures. No significant chronic ischemic change. Cerebral white matter and brainstem are normal.  Negative for mass lesion.  MRA HEAD FINDINGS  Both vertebral arteries are patent to the basilar. PICA patent bilaterally. Basilar widely patent. Superior cerebellar arteries are patent bilaterally. Fetal origin of the posterior cerebral artery bilaterally with hypoplastic P1 segments bilaterally.  Internal carotid artery is patent bilaterally without significant stenosis.  Right anterior and right middle cerebral arteries are widely patent without stenosis or occlusion.  Left anterior cerebral artery widely patent.  Left M1 segment widely patent. There is a stenosis at the left M1 bifurcation. Mild to moderate stenosis in the temporal branch of the left middle cerebral artery.  IMPRESSION: Acute hemorrhagic infarct in the left frontal  operculum and left insular cortex. No other significant ischemic change.  Mild to moderate stenosis of the left MCA bifurcation extending into the temporal branch of the left MCA. This is less severe than would be expected for acute infarct and could represent some residual thrombus. Alternately, an embolus could be the cause of this infarction.   Electronically Signed   By: Marlan Palau M.D.   On: 10/16/2014 09:54   Dg Chest Port 1 View  10/15/2014   CLINICAL DATA:  Stroke, nonverbal.  EXAM: PORTABLE CHEST - 1 VIEW  COMPARISON:  None.  FINDINGS: The heart size and mediastinal contours are within normal limits. Both lungs are clear. The visualized skeletal structures are unremarkable.  IMPRESSION: No active disease.   Electronically Signed   By: Awilda Metro   On: 10/15/2014 23:14  Dg Swallowing Func-speech Pathology  10/16/2014   Carolan Shiver, CCC-SLP     10/16/2014  2:47 PM Objective Swallowing Evaluation: Modified Barium Swallowing Study   Patient Details  Name: JONTAVIUS RABALAIS MRN: [id number] Date of Birth: 1956-05-05  Today's Date: 10/16/2014 Time: 1315-1350 SLP Time Calculation (min) (ACUTE ONLY): 35 min  Past Medical History:  Past Medical History  Diagnosis Date  . Diabetes mellitus without complication   . Stroke 10/15/2014   Past Surgical History: History reviewed. No pertinent past  surgical history. HPI:  TIRON SUSKI is a 58 y.o. male with history of diabetes  mellitus type 2 on insulin started experiencing some difficulty  speaking since morning. As the day progressed patient's symptoms  got worse and by evening patient was taken to [facility]  hospital over there CT head done showed evolving stroke in the  left parietal region and patient was transferred to [facility] for further management. On-call neurologist Dr. Noel Christmas was consulted at this time neurologist felt that patient  was not a candidate for TPA. On exam patient is having right   facial droop and difficulty to speak. Patient has expressive  aphasia. Patient's tongue is deviated to the right.  MRI Acute  hemorrhagic infarct in the left frontal operculum and left  insular cortex.     Assessment / Plan / Recommendation Clinical Impression  Clinical impression: Pt presents with a mild oropharyngeal  dysphagia characterized by: 1) neurosensory impairments leading  to poor oral containment with anterior and posterior bolus loss;  2) trace, silent aspiration of thin liquids when consumed in  large quantities.  When pt consumes limited, small sips of  liquid, he is able to protect his airway.  A head turn to the  left was also protective, but no more effective than limiting  bolus size.  Pharyngeal clearance was excellent with no residue  for any consistencies.  Spoke with pt and his family at length  re: results of exam, recommendations, strategies to maintain  safety.  Family had many questions; we discussed dysphagia and  aphasia, prognosis, and communication strategies.  Recommend  dysphagia 3 diet with thin liquids; meds whole with puree.  SLP  will continue to follow for dysphagia/aphasia.     Treatment Recommendation  Therapy as outlined in treatment plan below    Diet Recommendation Dysphagia 3 (Mechanical Soft);Thin liquid   Liquid Administration via: Cup Medication Administration: Whole meds with puree Supervision: Patient able to self feed;Full supervision/cueing  for compensatory strategies (initally to ensure safety) Compensations: Slow rate;Small sips/bites;Check for pocketing Postural Changes and/or Swallow Maneuvers: Seated upright 90  degrees    Other  Recommendations Oral Care Recommendations: Oral care BID   Follow Up Recommendations  Outpatient SLP    Frequency and Duration min 3x week  2 weeks       SLP Swallow Goals     General Date of Onset: 10/15/14 HPI: AVYN COATE is a 58 y.o. male with history of diabetes  mellitus type 2 on insulin started experiencing some  difficulty  speaking since morning. As the day progressed patient's symptoms  got worse and by evening patient was taken to [facility] regional  hospital over there CT head done showed evolving stroke in the  left parietal region and patient was transferred to [facility] for further management. On-call neurologist Dr. Noel Christmas was consulted at this time neurologist felt that patient  was not a candidate for  TPA. On exam patient is having right  facial droop and difficulty to speak. Patient has expressive  aphasia. Patient's tongue is deviated to the right.  MRI Acute  hemorrhagic infarct in the left frontal operculum and left Type of Study: Modified Barium Swallowing Study Reason for Referral: Objectively evaluate swallowing function Diet Prior to this Study: NPO Temperature Spikes Noted: No Respiratory Status: Room air History of Recent Intubation: No Behavior/Cognition: Alert;Cooperative;Pleasant mood Oral Cavity - Dentition: Adequate natural dentition Oral Motor / Sensory Function: Impaired - see Bedside swallow  eval Self-Feeding Abilities: Able to feed self Patient Positioning: Upright in chair Baseline Vocal Quality: Low vocal intensity Volitional Cough: Weak Volitional Swallow: Able to elicit Anatomy: Within functional limits Pharyngeal Secretions: Not observed secondary MBS    Reason for Referral Objectively evaluate swallowing function   Oral Phase Oral Preparation/Oral Phase Oral Phase: Impaired Oral - Nectar Oral - Nectar Cup: Right anterior bolus loss;Piecemeal swallowing Oral - Thin Oral - Thin Cup: Right anterior bolus loss;Piecemeal swallowing Oral - Solids Oral - Puree: Weak lingual manipulation Oral - Mechanical Soft: Weak lingual manipulation Oral Phase - Comment Oral Phase - Comment: oral loss of liquids right side   Pharyngeal Phase Pharyngeal Phase Pharyngeal Phase: Impaired Pharyngeal - Nectar Pharyngeal - Nectar Cup: Premature spillage to pyriform sinuses Pharyngeal - Thin  Pharyngeal - Thin Cup: Premature spillage to pyriform  sinuses;Compensatory strategies attempted (Comment);Trace  aspiration;Penetration/Aspiration during swallow (head turn to  left as effective as limiting bolus size) Penetration/Aspiration details (thin cup): Material enters  airway, passes BELOW cords without attempt by patient to eject  out (silent aspiration) Pharyngeal - Solids Pharyngeal - Puree: Premature spillage to valleculae Pharyngeal - Regular: Premature spillage to valleculae  Cervical Esophageal Phase   Amanda L. Samson Fredericouture, [place] CCC/SLP Pager [phone]               Blenda MountsCouture, Amanda Laurice 10/16/2014, 2:45 PM    Mr Maxine GlennMra Head/brain Wo Cm  10/16/2014   CLINICAL DATA:  Stroke.  Diabetes.  Expressive aphasia.  EXAM: MRI HEAD WITHOUT CONTRAST  MRA HEAD WITHOUT CONTRAST  TECHNIQUE: Multiplanar, multiecho pulse sequences of the brain and surrounding structures were obtained without intravenous contrast. Angiographic images of the head were obtained using MRA technique without contrast.  COMPARISON:  CT head 10/15/2014  FINDINGS: MRI HEAD FINDINGS  Acute hemorrhagic infarct in the left frontal operculum extending into the insular cortex. Infarct measures approximately 3 x 5 cm. No other acute infarct identified  Ventricle size is normal. No shift of the midline structures. No significant chronic ischemic change. Cerebral white matter and brainstem are normal.  Negative for mass lesion.  MRA HEAD FINDINGS  Both vertebral arteries are patent to the basilar. PICA patent bilaterally. Basilar widely patent. Superior cerebellar arteries are patent bilaterally. Fetal origin of the posterior cerebral artery bilaterally with hypoplastic P1 segments bilaterally.  Internal carotid artery is patent bilaterally without significant stenosis.  Right anterior and right middle cerebral arteries are widely patent without stenosis or occlusion.  Left anterior cerebral artery widely patent.  Left M1 segment widely patent. There  is a stenosis at the left M1 bifurcation. Mild to moderate stenosis in the temporal branch of the left middle cerebral artery.  IMPRESSION: Acute hemorrhagic infarct in the left frontal operculum and left insular cortex. No other significant ischemic change.  Mild to moderate stenosis of the left MCA bifurcation extending into the temporal branch of the left MCA. This is less severe than would be expected for  acute infarct and could represent some residual thrombus. Alternately, an embolus could be the cause of this infarction.   Electronically Signed   By: Marlan Palau M.D.   On: 10/16/2014 09:54    12-lead ECG none   Telemetry nsr Assessment and Plan:  1. Cryptogenic stroke The patient presents with cryptogenic stroke.  The patient has a TEE planned for this AM.  I spoke at length with the patient about monitoring for afib with either a 30 day event monitor or an implantable loop recorder.  Risks, benefits, and alteratives to implantable loop recorder were discussed with the patient today.   At this time, the patient is very clear in their decision to proceed with implantable loop recorder.   Wound care was reviewed with the patient (keep incision clean and dry for 3 days).  Wound check scheduled office will call  Please call with questions.

## 2014-10-18 NOTE — Progress Notes (Addendum)
    [facility] has been requested to perform a transesophageal echocardiogram on 10/18/14 for CVA.  After careful review of history and examination, the risks and benefits of transesophageal echocardiogram have been explained including risks of esophageal damage, perforation (1:10,000 risk), bleeding, pharyngeal hematoma as well as other potential complications associated with conscious sedation including aspiration, arrhythmia, respiratory failure and death. Alternatives to treatment were discussed, questions were answered. Patient is willing to proceed. Keep NPO  Maxwell Clark, Maxwell [address] R, PA-C 10/18/2014 8:13 AM

## 2014-10-18 NOTE — CV Procedure (Signed)
Pre op Dx Cryptogenic Stroke Post op Dx same  Procedure  Loop Recorder implantation  After routine prep and drape of the left parasternal area, a small incision was created. A [employer] LINQ Reveal Loop Recorder  Serial Number  [id number] was inserted.    SteriStrip dressing was  applied.  The patient tolerated the procedure without apparent complication.

## 2014-10-21 ENCOUNTER — Encounter (HOSPITAL_COMMUNITY): Payer: Self-pay | Admitting: Cardiovascular Disease

## 2014-10-21 LAB — PROTHROMBIN GENE MUTATION

## 2014-10-21 LAB — PROTEIN C, TOTAL: PROTEIN C, TOTAL: 76 % (ref 72–160)

## 2014-10-21 LAB — FACTOR 5 LEIDEN

## 2014-10-21 LAB — PROTEIN S, TOTAL: Protein S Ag, Total: 86 % (ref 60–150)

## 2014-10-22 ENCOUNTER — Ambulatory Visit: Payer: BC Managed Care – PPO | Attending: Internal Medicine | Admitting: Speech Pathology

## 2014-10-22 ENCOUNTER — Telehealth: Payer: Self-pay | Admitting: *Deleted

## 2014-10-22 DIAGNOSIS — I69920 Aphasia following unspecified cerebrovascular disease: Secondary | ICD-10-CM | POA: Insufficient documentation

## 2014-10-22 DIAGNOSIS — I69320 Aphasia following cerebral infarction: Secondary | ICD-10-CM

## 2014-10-22 DIAGNOSIS — E119 Type 2 diabetes mellitus without complications: Secondary | ICD-10-CM | POA: Diagnosis not present

## 2014-10-22 DIAGNOSIS — E785 Hyperlipidemia, unspecified: Secondary | ICD-10-CM | POA: Diagnosis not present

## 2014-10-22 DIAGNOSIS — R482 Apraxia: Secondary | ICD-10-CM | POA: Insufficient documentation

## 2014-10-22 LAB — CARDIOLIPIN ANTIBODIES, IGG, IGM, IGA
Anticardiolipin IgA: 5 APL U/mL — ABNORMAL LOW (ref <22)
Anticardiolipin IgG: 17 GPL U/mL (ref <23)
Anticardiolipin IgM: 0 MPL U/mL — ABNORMAL LOW (ref <11)

## 2014-10-22 LAB — BETA-2-GLYCOPROTEIN I ABS, IGG/M/A
BETA-2-GLYCOPROTEIN I IGM: 5 M Units (ref <20)
Beta-2 Glyco I IgG: 4 G Units (ref <20)
Beta-2-Glycoprotein I IgA: 1 A Units (ref <20)

## 2014-10-22 NOTE — Telephone Encounter (Signed)
Form,Fmla GKN  to Tashia and Dr Pearlean BrownieSethi to be completed 10-22-14.

## 2014-10-22 NOTE — Therapy (Signed)
Speech Language Pathology Evaluation  Patient Details  Name: Maxwell OleaDavid G Clark MRN: [id number] Date of Birth: October 02, 1956  Encounter Date: 10/22/2014      End of Session - 10/22/14 1313    Visit Number 1   Number of Visits 16   Date for SLP Re-Evaluation 12/17/14   SLP Start Time 0933   SLP Time Calculation (min) 1018   SLP Time Calculation (min) 45 min   Activity Tolerance Patient tolerated treatment well      Past Medical History  Diagnosis Date  . Diabetes mellitus without complication   . Stroke 10/15/2014    Past Surgical History  Procedure Laterality Date  . Tee without cardioversion N/A 10/18/2014    Procedure: TRANSESOPHAGEAL ECHOCARDIOGRAM (TEE);  Surgeon: Thurmon FairMihai Croitoru, MD;  [facility];  Service: Cardiovascular;  Laterality: N/A;  loop recorder to follow     There were no vitals taken for this visit.  Visit Diagnosis: Verbal apraxia - Plan: SLP PLAN OF CARE CERT/RE-CERT  Aphasia due to recent cerebrovascular accident - Plan: SLP PLAN OF CARE CERT/RE-CERT      Subjective Assessment - 10/22/14 0943    Currently in Pain? 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 PMH + DM2. Lives with his wife.   Mobility Status walks Independently   Prior Functional Status   Cognitive/Linguistic Baseline Within functional limits   Type of Home House    Lives With Spouse   Available Support Family;Friend(s)   Vocation Full time employment   Auditory Comprehension   Overall Auditory Comprehension Impaired   Yes/No Questions Impaired   Basic Biographical Questions 76-100% accurate   Complex Questions 50-74% accurate   Commands Impaired   One Step Basic Commands 75-100%  accurate   Complex Commands 75-100% accurate   Conversation Simple   Reading Comprehension   Reading Status Impaired   Paragraph Level 26-50% accurate   Functional Environmental (signs, name badge) Within functional limits   Expression   Primary Mode of Expression Nonverbal - gestures   Verbal Expression   Overall Verbal Expression Impaired   Initiation Impaired   Automatic Speech Name;Social Response   Level of Generative/Spontaneous Verbalization Word   Repetition Impaired   Level of Impairment Word level   Naming Impairment   Responsive 26-50% accurate   Confrontation 75-100% accurate   Convergent Not tested   Divergent Not tested   Verbal Errors Aware of errors;Perseveration   Pragmatics No impairment   Effective Techniques Semantic cues;Phonemic cues;Sentence completion;Articulatory cues   Non-Verbal Means of Communication Gestures   Written Expression   Written Expression Exceptions to [facility]   Overall Writen Expression Impaired at biographical information level   Motor Speech   Overall Motor Speech Impaired   Phonation Hoarse   Articulation Impaired   Level of Impairment Word   Intelligibility Intelligibility reduced   Word 0-24% accurate   Motor Planning Impaired   Level of Impairment Word   Motor Speech Errors Aware;Groping for words   Assessment   Clinical Impression Statement Pt demonstrates a mild to moderate receptive aphasia, pt is able to comprehend signle words, but comprehension declines  with language complexity. He also demosntrates a moderate expressive aphasia complicated by verbal apraxia. The pt can again write some single words with moderate assist and can write his name independnetly, but cannot name or repeat and any attempts with melodic intonation result in minimal articulation, mostly just phonatory attempts with some intonation ability. Pt will beneift from intesive speech therapy to maximize functional communication. Will continue to treat pt acutely,  but he will need outpatient f/u as soon as possible after d/c.    SLP Recommendation/Assessment Patient needs continued Speech Lanaguage Pathology Services   Problem List Auditory comprehension;Reading comprehension;Written expression;Verbal expression   Plan   Duration Other (comment)  8-12 weeks   Treatment/Interventions Language facilitation;Cueing hierarchy;Functional tasks;SLP instruction and feedback;Compensatory strategies;Patient/family education   Individuals Consulted   Consulted and Agree with Results and Recommendations Patient;Family member/caregiver   Family Member Consulted  wife          ADULT SLP TREATMENT - 10/22/14 1323    Cognitive-Linquistic Treatment   Treatment focused on Aphasia;Apraxia   Skilled Treatment Trained pt and spouse on cueing techniques including semantic phrase completion, phonemic placement of 1st sound in cv word. Automatic speech with nursery rhymes and songs performed with 50% accuracy and consistent max cues. Responsive naming with semantic cues and cloze phrase 60% accuracy. CV words with pictures 60% accuracy with consistent max cues for initial consonants. Perseveration on vowels noted.    Assessment / Recommendations / Plan   Plan Continue with current plan of care          SLP Education - 10/22/14 1312    Education provided Yes   Education Details compensations for aphasia, aphasia vs apraxia, how to cue pt for homework   Person(s) Educated Patient;Spouse   Methods Explanation;Demonstration   Comprehension Verbalized understanding;Returned demonstration          SLP Short Term Goals - 10/22/14 1329    SLP SHORT TERM GOAL #1   Title Pt will perform automatic speech tasks with 80% accuracy and occassional minimal assistance   Time 4   Period Weeks   Status New   SLP SHORT TERM GOAL #2   Title Pt will repeat consonant vowel (cv) words/syllables with 80% accuracy and usual moderate assistance   Time 4   Period Weeks   Status  New   SLP SHORT TERM GOAL #3   Title Pt will write family/pet names and biographical information with 70% accuracy and occassional minimal assistance   Time 4   Period Weeks   Status New          SLP Long Term Goals - 10/22/14 1332    SLP LONG TERM GOAL #1   Title Pt will use multimodal communication to augment verbal expression at simple conversation level with 80% accuracy and occassional minimal assistance   Time 8   Period Weeks   Status New   SLP LONG TERM GOAL #2   Title Pt will produce 2-3 word phrases for response to questions and in conversation with 80% accuracy and occassional minimal assistance   Time 8   Period Weeks   Status New   SLP LONG TERM GOAL #3   Title Pt will write 2-3 word functional phrases with 80% accuracy and occassional minimal assistance   Time 8   Period Weeks          Plan - 10/22/14 1314    Clinical Impression Statement Pt presents with severe verbal apraxia, mild to moderate aphasia, right labial droop  with drool. Mr. Maxwell Clark answered simple and complex yes/no questions with 80% accuracy and extended time. He named simple objects to confrontation by approximating the word due to verbal apraxia. Responsive naming was 50% accurate with cues, Automatic speech required consistent max assistance with visual phonemic placement, unison with ST and hand cues. Verbal expression is impaired at the word level. Reading comprehension is intact to simple sentences. Written expression is intact to name, with perseveration and max assistance for address and family names. Pt demonsrates groping and perseveration at syllable and word level.   Speech Therapy Frequency 3x / week   Treatment/Interventions Patient/family education;Functional tasks;Environmental controls;Language facilitation;Compensatory techniques;Internal/external aids;SLP instruction and feedback;Cueing hierarchy   Potential to Achieve Goals Good   Consulted and Agree with Plan of Care Patient         Problem List Patient Active Problem List   Diagnosis Date Noted  . Dyslipidemia, goal LDL below 70 10/18/2014  . Acute ischemic stroke 10/15/2014  . Aphasia due to stroke 10/15/2014  . Stroke 10/15/2014  . Diabetes mellitus type 2, uncontrolled 10/15/2014                                              Clark, Radene JourneyLaura Ann 10/22/2014, 1:39 PM  Clinton SawyerLaura Lovvorn, MS, CCC-SLP 10/22/2014 1:40 PM Phone: [phone] Fax: [phone]

## 2014-10-22 NOTE — Patient Instructions (Signed)
Count 1 to 10 Days of the Week Months of the Year Simple songs (jingle bells, happy birthday) Pledge Nursery Rhymes  Try using open phrases and have Onalee HuaDavid complete them. If he gets stuck, or repeats the wrong sound, stop

## 2014-10-22 NOTE — Telephone Encounter (Signed)
Form received on 10/22/14.

## 2014-10-23 ENCOUNTER — Ambulatory Visit: Payer: BC Managed Care – PPO

## 2014-10-23 DIAGNOSIS — I69320 Aphasia following cerebral infarction: Secondary | ICD-10-CM

## 2014-10-23 DIAGNOSIS — R482 Apraxia: Secondary | ICD-10-CM | POA: Diagnosis not present

## 2014-10-23 NOTE — Therapy (Signed)
Maxwell Clark [address] [place], [place], [address] Phone: [phone]   Fax:  [phone]  Speech Language Pathology Treatment  Patient Details  Name: Maxwell Clark MRN: [id number] Date of Birth: 09/09/1956  Encounter Date: 10/23/2014      End of Session - 10/23/14 1019    Visit Number 2   Number of Visits 16   Date for SLP Re-Evaluation 12/17/14   SLP Start Time 0936   SLP Time Calculation (min) 1017   SLP Time Calculation (min) 41 min      Past Medical History  Diagnosis Date  . Diabetes mellitus without complication   . Stroke 10/15/2014    Past Surgical History  Procedure Laterality Date  . Tee without cardioversion N/A 10/18/2014    Procedure: TRANSESOPHAGEAL ECHOCARDIOGRAM (TEE);  Surgeon: Thurmon FairMihai Croitoru, MD;  [facility];  Service: Cardiovascular;  Laterality: N/A;  loop recorder to follow     There were no vitals taken for this visit.  Visit Diagnosis: Verbal apraxia  Aphasia due to recent cerebrovascular accident      Subjective Assessment - 10/22/14 0943    Currently in Pain? No/denies     S: No "S" due to severity of expressive language/apraxia.       ADULT SLP TREATMENT - 10/23/14 1003    General Information   Behavior/Cognition Alert;Cooperative;Pleasant mood   Treatment Provided   Treatment provided Cognitive-Linquistic   Pain Assessment   Pain Assessment No/denies pain   Assessment / Recommendations / Plan   Plan Continue with current plan of care   Progression Toward Goals   Progression toward goals Progressing toward goals     Paired words - max A consistently needed success=20% (approx 40% functional responses). Simple consonant/vowel naming - max A consistently needed, success 25% (approx 33% functional).  Auditory comprehension: pt appeared to comprehend therapy instructions approx 90% of the time. Pt ID'd picture described by SLP 100%. Responsive naming with these  words with consistent max A and 90% success.      SLP Education - 10/22/14 1312    Education provided Yes   Education Details compensations for aphasia, aphasia vs apraxia, how to cue pt for homework   Person(s) Educated Patient;Spouse   Methods Explanation;Demonstration   Comprehension Verbalized understanding;Returned demonstration          SLP Short Term Goals - 10/23/14 1020    SLP SHORT TERM GOAL #1   Status On-going   SLP SHORT TERM GOAL #2   Status On-going   SLP SHORT TERM GOAL #3   Status On-going          SLP Long Term Goals - 10/23/14 1020    SLP LONG TERM GOAL #1   Status On-going   SLP LONG TERM GOAL #2   Status On-going   SLP LONG TERM GOAL #3   Status On-going          Plan - 10/23/14 1019    Clinical Impression Statement Pt presents with severe verbal apraxia, mild to moderate aphasia, right labial droop with drool. [name] answered simple and complex yes/no questions with 80% accuracy and extended time. He named simple objects to confrontation by approximating the word due to verbal apraxia. Responsive naming was 50% accurate with cues, Automatic speech required consistent max assistance with visual phonemic placement, unison with ST and hand cues. Verbal expression is impaired at the word level. Reading comprehension is intact to simple sentences. Written expression is intact to name, with  perseveration and max assistance for address and family names. Pt demonsrates groping and perseveration at syllable and word level.   Treatment/Interventions Patient/family education;Functional tasks;Environmental controls;Language facilitation;Compensatory techniques;Internal/external aids;SLP instruction and feedback;Cueing hierarchy   Potential Considerations Severity of impairments   SLP Home Exercise Plan discussed with pt/wife today 15-20 minutes x3/day needed.   Consulted and Agree with Plan of Care Patient;Family member/caregiver               Problem List Patient Active Problem List   Diagnosis Date Noted  . Dyslipidemia, goal LDL below 70 10/18/2014  . Acute ischemic stroke 10/15/2014  . Aphasia due to stroke 10/15/2014  . Stroke 10/15/2014  . Diabetes mellitus type 2, uncontrolled 10/15/2014    Verdie Mosherarl Schinke, MS, CCC-SLP Speech-Language Pathologist [facility]  10/23/2014 10:22 AM  Phone: [phone] Fax: [phone]

## 2014-10-23 NOTE — Patient Instructions (Signed)
  Work on homework papers (saying the words) Vernona RiegerLaura gave you for 15-20 minutes, 3 times a day.

## 2014-10-24 ENCOUNTER — Ambulatory Visit: Payer: BC Managed Care – PPO | Admitting: Speech Pathology

## 2014-10-24 DIAGNOSIS — R482 Apraxia: Secondary | ICD-10-CM | POA: Diagnosis not present

## 2014-10-24 DIAGNOSIS — I69320 Aphasia following cerebral infarction: Secondary | ICD-10-CM

## 2014-10-24 NOTE — Therapy (Signed)
[facility] [address] [place], [place], [address] Phone: [phone]   Fax:  [phone]  Speech Language Pathology Treatment  Patient Details  Name: Maxwell Clark MRN: [id number] Date of Birth: [birth year]  Encounter Date: 10/24/2014      End of Session - 10/24/14 0928    Visit Number 3   Number of Visits 16   Date for SLP Re-Evaluation 12/17/14   SLP Start Time 0845   SLP Stop Time  0928   SLP Time Calculation (min) 43 min   Activity Tolerance Patient tolerated treatment well      Past Medical History  Diagnosis Date  . Diabetes mellitus without complication   . Stroke 10/15/2014    Past Surgical History  Procedure Laterality Date  . Tee without cardioversion N/A 10/18/2014    Procedure: TRANSESOPHAGEAL ECHOCARDIOGRAM (TEE);  Surgeon: Thurmon FairMihai Croitoru, MD;  [facility];  Service: Cardiovascular;  Laterality: N/A;  loop recorder to follow     There were no vitals taken for this visit.  Visit Diagnosis: Verbal apraxia  Aphasia due to recent cerebrovascular accident      Subjective Assessment - 10/24/14 0852    Symptoms "We did home work for 20 minutes then for 10 minutes"   Currently in Pain? No/denies            ADULT SLP TREATMENT - 10/24/14 0001    General Information   Behavior/Cognition Alert;Pleasant mood   HPI CVA with verbal apraxia   Treatment Provided   Treatment provided Cognitive-Linquistic   Cognitive-Linquistic Treatment   Treatment focused on Apraxia   Skilled Treatment Trained pt and spouse on cueing techniques including semantic phrase completion, phonemic placement of 1st sound in cv word. Automatic speech with nursery rhymes and songs performed with 50% accuracy and consistent max cues. Responsive naming with semantic cues and cloze phrase 60% accuracy. CV words with pictures 60% accuracy with consistent max cues for initial consonants. Perseveration on vowels noted.     Assessment / Recommendations / Plan   Plan Continue with current plan of care   Progression Toward Goals   Progression toward goals Progressing toward goals          SLP Education - 10/24/14 0921    Education provided Yes   Education Details cueing strategies for verbal and oral apraxia and verbal apraxia   Person(s) Educated Patient;Spouse   Methods Explanation;Demonstration   Comprehension Verbalized understanding;Returned demonstration          SLP Short Term Goals - 10/23/14 1020    SLP SHORT TERM GOAL #1   Status On-going   SLP SHORT TERM GOAL #2   Status On-going   SLP SHORT TERM GOAL #3   Status On-going          SLP Long Term Goals - 10/23/14 1020    SLP LONG TERM GOAL #1   Status On-going   SLP LONG TERM GOAL #2   Status On-going   SLP LONG TERM GOAL #3   Status On-going          Plan - 10/24/14 0929    Clinical Impression Statement Pt continues to present with severe verbal and oral apraxia and mild aphasia. Spontaneous utterances remain vowels. Max A with imitating CV words with carrier phrase, in unison, and placement  cues. Recommend continue skilled ST to maximize verbal exspressoin.   Speech Therapy Frequency 3x / week   Treatment/Interventions Patient/family education;Functional tasks;Environmental controls;Language facilitation;Compensatory techniques;Internal/external aids;SLP instruction and feedback;Cueing  hierarchy   Potential to Achieve Goals Good   Potential Considerations Severity of impairments   SLP Home Exercise Plan discussed with pt/wife today 15-20 minutes x3/day needed.                               Problem List Patient Active Problem List   Diagnosis Date Noted  . Dyslipidemia, goal LDL below 70 10/18/2014  . Acute ischemic stroke 10/15/2014  . Aphasia due to stroke 10/15/2014  . Stroke 10/15/2014  . Diabetes mellitus type 2, uncontrolled 10/15/2014    Maxwell Clark, Maxwell Clark 10/24/2014, 9:33 AM   Clinton SawyerLaura Mikela Senn, MS, CCC-SLP 10/24/2014 9:34 AM Phone: [phone] Fax: [phone]

## 2014-10-24 NOTE — Patient Instructions (Signed)
Count  Days Months Nursery [place] [address], prayers Songs (jingle bells, happy birthday)  Fa..LA LA LA High and LOW In bed I Lay Its a sin to tell a LIE Christen BameMary LOU Peggy LEE  At church I wear a bow  TIE Would you like a cup of TEA I stubbed my big TOE  Yes and NO Day and Night I hurt my KNEE Come here right NOW  Non Speech tasks:  [employer]Blow Kiss Whistle Whisper vowels HA HEE, HOO

## 2014-10-29 ENCOUNTER — Ambulatory Visit: Payer: BC Managed Care – PPO

## 2014-10-29 DIAGNOSIS — R482 Apraxia: Secondary | ICD-10-CM | POA: Diagnosis not present

## 2014-10-29 DIAGNOSIS — I69320 Aphasia following cerebral infarction: Secondary | ICD-10-CM

## 2014-10-29 NOTE — Patient Instructions (Signed)
  Please complete the assigned speech therapy homework ("F" sound and simple words) before your next session.

## 2014-10-29 NOTE — Therapy (Signed)
[facility] [address] [place], [place], [address] Phone: [phone]   Fax:  [phone]  Speech Language Pathology Treatment  Patient Details  Name: Maxwell OleaDavid G Clark MRN: [id number] Date of Birth: December 31, 1955  Encounter Date: 10/29/2014      End of Session - 10/29/14 1542    Visit Number 4   Number of Visits 24  (x3/week x8 weeks of treatment)   Date for SLP Re-Evaluation 12/17/14   SLP Start Time 1448   SLP Stop Time  1530   SLP Time Calculation (min) 42 min      Past Medical History  Diagnosis Date  . Diabetes mellitus without complication   . Stroke 10/15/2014    Past Surgical History  Procedure Laterality Date  . Tee without cardioversion N/A 10/18/2014    Procedure: TRANSESOPHAGEAL ECHOCARDIOGRAM (TEE);  Surgeon: Thurmon FairMihai Croitoru, MD;  [facility];  Service: Cardiovascular;  Laterality: N/A;  loop recorder to follow     There were no vitals taken for this visit.  Visit Diagnosis: Verbal apraxia  Aphasia due to recent cerebrovascular accident      Subjective Assessment - 10/29/14 1453    Symptoms No "S" given severity of deficit.   Currently in Pain? No/denies            ADULT SLP TREATMENT - 10/29/14 1454    General Information   Behavior/Cognition Alert;Cooperative;Pleasant mood   Treatment Provided   Treatment provided Cognitive-Linquistic   Pain Assessment   Pain Assessment No/denies pain   Cognitive-Linquistic Treatment   Treatment focused on Apraxia   Skilled Treatment Apraxic errors noted with 1-10 and 1-20, with min visual cues for 11-20. Velar sounds noted for syllibannts (s, sh, f). Days of week with 3/7 functional intelligibility, 5/7 if listener knew pt was giving days of week in succession. Months - 2/12 functional with simultaneous SLP production.Paired words with initial phoneme produced approx 30%. Functionally, pt intelligible approx 50% of the time, given listener  knowledge of paired word. named pictures with written words placed on the picture with approx 65% functional intelligibility, approx 25% if listener did not know word produced. SLP guided pt through tactile and visual stimulation/cues for /f/ isoloation - 0% success.    Assessment / Recommendations / Plan   Plan Continue with current plan of care   Progression Toward Goals   Progression toward goals Progressing toward goals          SLP Education - 10/29/14 1541    Education Details production/stimulation of /f/    Person(s) Educated Spouse;Patient   Methods Explanation;Demonstration;Tactile cues;Verbal cues   Comprehension Verbalized understanding          SLP Short Term Goals - 10/29/14 1544    SLP SHORT TERM GOAL #1   Title Pt will perform automatic speech tasks with 80% accuracy and occassional minimal assistance   Time 3   Period Weeks   SLP SHORT TERM GOAL #2   Title Pt will repeat consonant vowel (cv) words/syllables with 80% accuracy and usual moderate assistance   Time 3   Period Weeks   SLP SHORT TERM GOAL #3   Title Pt will write family/pet names and biographical information with 70% accuracy and occassional minimal assistance   Time 3   Period Weeks          SLP Long Term Goals - 10/29/14 1545    SLP LONG TERM GOAL #1   Title Pt will use multimodal communication to augment verbal expression  at simple conversation level with 80% accuracy and occassional minimal assistance   Time 7   Status On-going   SLP LONG TERM GOAL #2   Title Pt will produce 2-3 word phrases for response to questions and in conversation with 80% accuracy and occassional minimal assistance   Time 7   Period Weeks   Status On-going   SLP LONG TERM GOAL #3   Title Pt will write 2-3 word functional phrases with 80% accuracy and occassional minimal assistance   Time 7   Period Weeks   Status On-going          Plan - 10/29/14 1542    Clinical Impression Statement Pt continues to  present with severe verbal and oral apraxia and mild aphasia. Spontaneous utterances remain vowels. Max A with imitating words in cloze phrases as well as in simple paired words. Recommend continue skilled ST to maximize verbal exspressoin.   Speech Therapy Frequency 3x / week   Duration --  at least 7 weeks   Treatment/Interventions Patient/family education;Functional tasks;Environmental controls;Language facilitation;Compensatory techniques;Internal/external aids;SLP instruction and feedback;Cueing hierarchy   Potential to Achieve Goals Good   Potential Considerations Severity of impairments             Problem List Patient Active Problem List   Diagnosis Date Noted  . Dyslipidemia, goal LDL below 70 10/18/2014  . Acute ischemic stroke 10/15/2014  . Aphasia due to stroke 10/15/2014  . Stroke 10/15/2014  . Diabetes mellitus type 2, uncontrolled 10/15/2014    Verdie Mosherarl Eldar Robitaille, MS, CCC-SLP Speech-Language Pathologist [facility]  10/29/2014 3:47 PM  Phone: [phone] Fax: [phone]

## 2014-10-30 ENCOUNTER — Ambulatory Visit: Payer: BC Managed Care – PPO

## 2014-10-30 DIAGNOSIS — R482 Apraxia: Secondary | ICD-10-CM

## 2014-10-30 DIAGNOSIS — I69320 Aphasia following cerebral infarction: Secondary | ICD-10-CM

## 2014-10-30 NOTE — Therapy (Signed)
[facility] [address] [place], [place], [address] Phone: [phone]   Fax:  [phone]  Speech Language Pathology Treatment  Patient Details  Name: Maxwell Clark MRN: [id number] Date of Birth: February 22, 1956  Encounter Date: 10/30/2014      End of Session - 10/30/14 1627    Visit Number 5   Number of Visits 24   Date for SLP Re-Evaluation 12/17/14   SLP Start Time 1535   SLP Stop Time  1615   SLP Time Calculation (min) 40 min      Past Medical History  Diagnosis Date  . Diabetes mellitus without complication   . Stroke 10/15/2014    Past Surgical History  Procedure Laterality Date  . Tee without cardioversion N/A 10/18/2014    Procedure: TRANSESOPHAGEAL ECHOCARDIOGRAM (TEE);  Surgeon: Thurmon FairMihai Croitoru, MD;  [facility];  Service: Cardiovascular;  Laterality: N/A;  loop recorder to follow     There were no vitals taken for this visit.  Visit Diagnosis: Verbal apraxia  Aphasia due to recent cerebrovascular accident      Subjective Assessment - 10/30/14 1616    Symptoms "yesh" (yes)   Currently in Pain? No/denies            ADULT SLP TREATMENT - 10/30/14 1617    General Information   Behavior/Cognition Alert;Cooperative;Pleasant mood   Treatment Provided   Treatment provided Cognitive-Linquistic   Pain Assessment   Pain Assessment No/denies pain   Cognitive-Linquistic Treatment   Treatment focused on Apraxia;Aphasia   Skilled Treatment Consonant vowel (CV) and consonant vowel consonant (CVC) words practiced independently with 35% success functionally. Pt with voicing errors with all classes (velar, bilabial, alveolars). Max A/imitation needed for pt success, consistently. Paired words completed with 20% success functionally, simultaneous production needed to improve success to 50% functional intelligibility. Long discussion (approx 10 minutes) regarding frequency of practice at home and cuing  methods for wife to use with pt during home practice.   Assessment / Recommendations / Plan   Plan Continue with current plan of care   Progression Toward Goals   Progression toward goals Progressing toward goals          SLP Education - 10/30/14 1626    Education provided Yes   Education Details cuing strategies for home tasks, other home tasks to complete with pt   Person(s) Educated Patient;Spouse   Methods Explanation;Demonstration;Verbal cues   Comprehension Verbalized understanding          SLP Short Term Goals - 10/30/14 1630    SLP SHORT TERM GOAL #1   Title Pt will perform automatic speech tasks with 80% accuracy and occassional minimal assistance   Time 3   Status On-going   SLP SHORT TERM GOAL #2   Title Pt will repeat consonant vowel (cv) words/syllables with 80% accuracy and usual moderate assistance   Time 3   Status On-going   SLP SHORT TERM GOAL #3   Title Pt will write family/pet names and biographical information with 70% accuracy and occassional minimal assistance   Time 3   Status On-going          SLP Long Term Goals - 10/30/14 1630    SLP LONG TERM GOAL #1   Title Pt will use multimodal communication to augment verbal expression at simple conversation level with 80% accuracy and occassional minimal assistance   Time 7   Period Weeks   Status On-going   SLP LONG TERM GOAL #2   Title  Pt will produce 2-3 word phrases for response to questions and in conversation with 80% accuracy and occassional minimal assistance   Time 7   Period Weeks   Status On-going   SLP LONG TERM GOAL #3   Title Pt will write 2-3 word functional phrases with 80% accuracy and occassional minimal assistance   Time 7   Period Weeks   Status On-going          Plan - 10/30/14 1627    Clinical Impression Statement Pt continues to present with severe verbal and oral apraxia and mild aphasia. Spontaneous utterances remain mostly vowels in nature. Simultaneous production  cont'd needed in simple paired words and consonant vowel words. Recommend continue skilled ST to maximize verbal exspressoin.   Speech Therapy Frequency 3x / week   Duration --  7 weeks    Treatment/Interventions Patient/family education;Functional tasks;Environmental controls;Language facilitation;Compensatory techniques;Internal/external aids;SLP instruction and feedback;Cueing hierarchy   Potential to Achieve Goals Good   Potential Considerations Severity of impairments                     Problem List Patient Active Problem List   Diagnosis Date Noted  . Dyslipidemia, goal LDL below 70 10/18/2014  . Acute ischemic stroke 10/15/2014  . Aphasia due to stroke 10/15/2014  . Stroke 10/15/2014  . Diabetes mellitus type 2, uncontrolled 10/15/2014    Verdie Mosherarl Schinke, MS, CCC-SLP Speech-Language Pathologist [facility]  10/30/2014 4:32 PM  Phone: [phone] Fax: [phone]

## 2014-10-30 NOTE — Patient Instructions (Signed)
  Please complete the assigned speech therapy homework before your next session.  

## 2014-10-31 ENCOUNTER — Ambulatory Visit: Payer: BC Managed Care – PPO

## 2014-10-31 ENCOUNTER — Encounter (HOSPITAL_COMMUNITY): Payer: Self-pay | Admitting: Internal Medicine

## 2014-10-31 ENCOUNTER — Ambulatory Visit (INDEPENDENT_AMBULATORY_CARE_PROVIDER_SITE_OTHER): Payer: BC Managed Care – PPO | Admitting: *Deleted

## 2014-10-31 DIAGNOSIS — R482 Apraxia: Secondary | ICD-10-CM | POA: Diagnosis not present

## 2014-10-31 DIAGNOSIS — I639 Cerebral infarction, unspecified: Secondary | ICD-10-CM

## 2014-10-31 DIAGNOSIS — I69320 Aphasia following cerebral infarction: Secondary | ICD-10-CM

## 2014-10-31 LAB — MDC_IDC_ENUM_SESS_TYPE_INCLINIC
Date Time Interrogation Session: 20151210162027
MDC IDC SET ZONE DETECTION INTERVAL: 2000 ms
MDC IDC SET ZONE DETECTION INTERVAL: 3000 ms
Zone Setting Detection Interval: 350 ms

## 2014-10-31 NOTE — Therapy (Signed)
[facility] [address] [place], [place], [address] Phone: [phone]   Fax:  [phone]  Speech Language Pathology Treatment  Patient Details  Name: Maxwell Clark MRN: [id number] Date of Birth: 01-06-1956  Encounter Date: 10/31/2014      End of Session - 10/31/14 1624    Visit Number 6   Number of Visits 24   Date for SLP Re-Evaluation 12/17/14   SLP Start Time 1450   SLP Stop Time  1530   SLP Time Calculation (min) 40 min      Past Medical History  Diagnosis Date  . Diabetes mellitus without complication   . Stroke 10/15/2014    Past Surgical History  Procedure Laterality Date  . Tee without cardioversion N/A 10/18/2014    Procedure: TRANSESOPHAGEAL ECHOCARDIOGRAM (TEE);  Surgeon: Thurmon FairMihai Croitoru, MD;  [facility];  Service: Cardiovascular;  Laterality: N/A;  loop recorder to follow   . Loop recorder implant N/A 10/18/2014    Procedure: LOOP RECORDER IMPLANT;  Surgeon: Duke SalviaSteven C Klein, MD;  [facility];  Service: Cardiovascular;  Laterality: N/A;    There were no vitals taken for this visit.  Visit Diagnosis: Verbal apraxia  Aphasia due to recent cerebrovascular accident      Subjective Assessment - 10/31/14 1501    Symptoms "uh-huh"            ADULT SLP TREATMENT - 10/31/14 1504    General Information   Behavior/Cognition Alert;Cooperative;Pleasant mood   Treatment Provided   Treatment provided Cognitive-Linquistic   Pain Assessment   Pain Assessment No/denies pain   Cognitive-Linquistic Treatment   Treatment focused on Apraxia   Skilled Treatment Named pictures  80%, functional intelligibility 60% with usual min A, opposites with 88% success, functional intelligibility approx 65%.  Pt completed consonants matched with vowels with 50-60% success - /d/ and /w/ with less success (20%)   Assessment / Recommendations / Plan   Plan Continue with current plan of care   Progression Toward Goals   Progression toward goals Progressing toward goals          SLP Education - 10/31/14 1611    Education provided Yes   Education Details education re: consonant and vowel matching - how to cue with less-practiced consonants /w/ and /d/   Person(s) Educated Patient;Spouse   Methods Explanation;Demonstration   Comprehension Verbalized understanding          SLP Short Term Goals - 10/31/14 1628    SLP SHORT TERM GOAL #1   Title Pt will perform automatic speech tasks with 80% accuracy and occassional minimal assistance   Time 3   Status On-going   SLP SHORT TERM GOAL #2   Title Pt will repeat consonant vowel (cv) words/syllables with 80% accuracy and usual moderate assistance   Time 3   Status On-going   SLP SHORT TERM GOAL #3   Title Pt will write family/pet names and biographical information with 70% accuracy and occassional minimal assistance   Time 3   Status On-going          SLP Long Term Goals - 10/31/14 1633    SLP LONG TERM GOAL #1   Title Pt will use multimodal communication to augment verbal expression at simple conversation level with 80% accuracy and occassional minimal assistance   Time 7   Period Weeks   Status On-going   SLP LONG TERM GOAL #2   Title Pt will produce 2-3 word phrases for response to  questions and in conversation with 80% accuracy and occassional minimal assistance   Time 7   Period Weeks   Status On-going   SLP LONG TERM GOAL #3   Title Pt will write 2-3 word functional phrases with 80% accuracy and occassional minimal assistance   Time 7   Period Weeks   Status On-going          Plan - 10/31/14 1625    Clinical Impression Statement Pt continues to present with severe verbal and oral apraxia and mild aphasia. Spontaneous utterances remain mostly vowels in nature. Simultaneous production cont'd needed in simple paired words and consonant vowel words. Recommend continue skilled ST to maximize verbal  exspressoin.   Speech Therapy Frequency 3x / week   Duration --  7 weeks   Treatment/Interventions Patient/family education;Functional tasks;Environmental controls;Language facilitation;Compensatory techniques;Internal/external aids;SLP instruction and feedback;Cueing hierarchy   Potential to Achieve Goals Good   Potential Considerations Severity of impairments                     Problem List Patient Active Problem List   Diagnosis Date Noted  . Dyslipidemia, goal LDL below 70 10/18/2014  . Acute ischemic stroke 10/15/2014  . Aphasia due to stroke 10/15/2014  . Stroke 10/15/2014  . Diabetes mellitus type 2, uncontrolled 10/15/2014   Verdie Mosherarl Schinke, MS, CCC-SLP Speech-Language Pathologist [facility]  10/31/2014 4:34 PM  Phone: [phone] Fax: [phone]

## 2014-10-31 NOTE — Progress Notes (Signed)
Wound check s/p ILR implant. Wound well healed without redness or edema. Pt with 0 tachy episodes; 0 brady episodes; 0 asystole; 0 AF; 0 symptom episodes. [facility] and with SK in 3 months.

## 2014-11-12 ENCOUNTER — Encounter: Payer: BC Managed Care – PPO | Admitting: Speech Pathology

## 2014-11-13 ENCOUNTER — Other Ambulatory Visit: Payer: Self-pay | Admitting: Internal Medicine

## 2014-11-18 ENCOUNTER — Ambulatory Visit: Payer: Self-pay | Admitting: Family Medicine

## 2014-11-18 ENCOUNTER — Ambulatory Visit (INDEPENDENT_AMBULATORY_CARE_PROVIDER_SITE_OTHER): Payer: BC Managed Care – PPO | Admitting: *Deleted

## 2014-11-18 DIAGNOSIS — I639 Cerebral infarction, unspecified: Secondary | ICD-10-CM

## 2014-11-18 LAB — MDC_IDC_ENUM_SESS_TYPE_REMOTE: Date Time Interrogation Session: 20151217050500

## 2014-11-19 ENCOUNTER — Encounter: Payer: BC Managed Care – PPO | Admitting: Speech Pathology

## 2014-11-20 ENCOUNTER — Encounter: Payer: Self-pay | Admitting: Internal Medicine

## 2014-11-20 NOTE — Progress Notes (Signed)
Loop recorder 

## 2014-11-22 ENCOUNTER — Ambulatory Visit: Payer: Self-pay | Admitting: Family Medicine

## 2014-11-26 LAB — LIPID PANEL
Cholesterol: 117 mg/dL (ref 0–200)
HDL: 56 mg/dL (ref 35–70)
LDL CALC: 50 mg/dL
TRIGLYCERIDES: 54 mg/dL (ref 40–160)

## 2014-11-26 LAB — BASIC METABOLIC PANEL
Creatinine: 0.8 mg/dL (ref <=1.3)
GLUCOSE: 83 mg/dL

## 2014-12-03 ENCOUNTER — Encounter: Payer: BC Managed Care – PPO | Admitting: Speech Pathology

## 2014-12-05 ENCOUNTER — Encounter: Payer: BC Managed Care – PPO | Admitting: Speech Pathology

## 2014-12-17 ENCOUNTER — Ambulatory Visit (INDEPENDENT_AMBULATORY_CARE_PROVIDER_SITE_OTHER): Payer: BLUE CROSS/BLUE SHIELD | Admitting: *Deleted

## 2014-12-17 DIAGNOSIS — I639 Cerebral infarction, unspecified: Secondary | ICD-10-CM

## 2014-12-19 ENCOUNTER — Encounter: Payer: Self-pay | Admitting: Internal Medicine

## 2014-12-19 NOTE — Progress Notes (Signed)
Loop recorder 

## 2014-12-23 ENCOUNTER — Ambulatory Visit: Payer: Self-pay | Admitting: Family Medicine

## 2014-12-27 ENCOUNTER — Telehealth: Payer: Self-pay | Admitting: *Deleted

## 2014-12-27 NOTE — Telephone Encounter (Signed)
Called patient and lvm to call back and r/s 2/17 appointment with Dr Pearlean BrownieSethi.

## 2014-12-30 ENCOUNTER — Encounter: Payer: Self-pay | Admitting: Internal Medicine

## 2014-12-31 ENCOUNTER — Encounter: Payer: Self-pay | Admitting: *Deleted

## 2015-01-06 ENCOUNTER — Institutional Professional Consult (permissible substitution): Payer: Self-pay | Admitting: Neurology

## 2015-01-08 ENCOUNTER — Ambulatory Visit: Payer: Self-pay | Admitting: Neurology

## 2015-01-08 LAB — MDC_IDC_ENUM_SESS_TYPE_REMOTE: Date Time Interrogation Session: 20160203050500

## 2015-01-10 ENCOUNTER — Institutional Professional Consult (permissible substitution): Payer: Self-pay | Admitting: Neurology

## 2015-01-13 ENCOUNTER — Ambulatory Visit: Payer: Self-pay | Admitting: Neurology

## 2015-01-13 ENCOUNTER — Encounter: Payer: Self-pay | Admitting: Neurology

## 2015-01-16 ENCOUNTER — Ambulatory Visit (INDEPENDENT_AMBULATORY_CARE_PROVIDER_SITE_OTHER): Payer: BLUE CROSS/BLUE SHIELD | Admitting: *Deleted

## 2015-01-16 DIAGNOSIS — I639 Cerebral infarction, unspecified: Secondary | ICD-10-CM

## 2015-01-16 LAB — MDC_IDC_ENUM_SESS_TYPE_REMOTE: Date Time Interrogation Session: 20160224050500

## 2015-01-17 NOTE — Progress Notes (Signed)
Loop recorder 

## 2015-01-20 NOTE — Telephone Encounter (Signed)
Patient was r/s for 02/26/15 at 8:15 with Dr Roda ShuttersXu.

## 2015-01-21 ENCOUNTER — Encounter: Admit: 2015-01-21 | Disposition: A | Discharge status: Home / Self Care | Payer: Self-pay

## 2015-01-24 ENCOUNTER — Telehealth: Payer: Self-pay | Admitting: *Deleted

## 2015-01-24 NOTE — Telephone Encounter (Signed)
Form,Dr Pearlean BrownieSethi  Will not do form.

## 2015-01-27 ENCOUNTER — Encounter: Payer: Self-pay | Admitting: Internal Medicine

## 2015-01-28 ENCOUNTER — Ambulatory Visit (INDEPENDENT_AMBULATORY_CARE_PROVIDER_SITE_OTHER): Payer: BLUE CROSS/BLUE SHIELD | Admitting: Internal Medicine

## 2015-01-28 ENCOUNTER — Encounter: Payer: Self-pay | Admitting: Internal Medicine

## 2015-01-28 VITALS — BP 112/70 | HR 71 | Ht 70.0 in | Wt 177.1 lb

## 2015-01-28 DIAGNOSIS — Z4509 Encounter for adjustment and management of other cardiac device: Secondary | ICD-10-CM

## 2015-01-28 DIAGNOSIS — I639 Cerebral infarction, unspecified: Secondary | ICD-10-CM

## 2015-01-28 LAB — MDC_IDC_ENUM_SESS_TYPE_INCLINIC

## 2015-01-28 NOTE — Progress Notes (Signed)
      Patient Care Team: Duanne Limerickeanna C Jones, MD as PCP - General (Family Medicine)   HPI  Maxwell OleaDavid G Bora is a 59 y.o. male Seen in followup of ILR implanted 11/15 for Cryptogenic Stroke   Echo was normal.  He has hx of HTN and DM   Past Medical History  Diagnosis Date  . Diabetes mellitus without complication   . Stroke 10/15/2014    Past Surgical History  Procedure Laterality Date  . Tee without cardioversion N/A 10/18/2014    Procedure: TRANSESOPHAGEAL ECHOCARDIOGRAM (TEE);  Surgeon: Thurmon FairMihai Croitoru, MD;  [facility];  Service: Cardiovascular;  Laterality: N/A;  loop recorder to follow   . Loop recorder implant N/A 10/18/2014    Procedure: LOOP RECORDER IMPLANT;  Surgeon: Duke SalviaSteven C Cleburn Maiolo, MD;  [facility];  Service: Cardiovascular;  Laterality: N/A;    Current Outpatient Prescriptions  Medication Sig Dispense Refill  . aspirin EC 81 MG tablet Take 1 tablet (81 mg total) by mouth daily. 30 tablet 0  . atorvastatin (LIPITOR) 80 MG tablet Take 1 tablet (80 mg total) by mouth daily at 6 PM. 30 tablet 0  . insulin glargine (LANTUS) 100 UNIT/ML injection Inject 13 Units into the skin at bedtime.      No current facility-administered medications for this visit.    Allergies  Allergen Reactions  . Penicillins     unknown    Review of Systems negative except from HPI and PMH  Physical Exam BP 112/70 mmHg  Pulse 71  Ht 5\' 10"  (1.778 m)  Wt 177 lb 1.9 oz (80.341 kg)  BMI 25.41 kg/m2 Well developed and well nourished in no acute distress HENT normal E scleral and icterus clear Neck Supple JVP flat; carotids brisk and full Clear to ausculation Device pocket well healed; without hematoma or erythema.  There is no tethering Regular rate and rhythm, no murmurs gallops or rub Soft with active bowel sounds No clubbing cyanosis  edema Alert and oriented, grossly normal motor and sensory function Skin Warm and Dry  ECG: Sinus Rhythm  @71   Intervals  16/08/41  Axis 68     Assessment and  Plan  Cryptogenic Stroke  Loop Recorder   No intercurrent atrial fibrillation or flutter  BP  Now normal

## 2015-01-28 NOTE — Patient Instructions (Signed)
Your physician recommends that you continue on your current medications as directed. Please refer to the Current Medication list given to you today.  Your physician wants you to follow-up in: November with Dr. Graciela HusbandsKlein. You will receive a reminder letter in the mail two months in advance. If you don't receive a letter, please call our office to schedule the follow-up appointment.

## 2015-02-04 ENCOUNTER — Encounter: Payer: Self-pay | Admitting: Internal Medicine

## 2015-02-05 ENCOUNTER — Encounter: Payer: Self-pay | Admitting: Internal Medicine

## 2015-02-07 ENCOUNTER — Encounter: Payer: Self-pay | Admitting: Internal Medicine

## 2015-02-14 ENCOUNTER — Ambulatory Visit (INDEPENDENT_AMBULATORY_CARE_PROVIDER_SITE_OTHER): Payer: BLUE CROSS/BLUE SHIELD | Admitting: *Deleted

## 2015-02-14 DIAGNOSIS — I639 Cerebral infarction, unspecified: Secondary | ICD-10-CM

## 2015-02-18 ENCOUNTER — Encounter: Payer: Self-pay | Admitting: Internal Medicine

## 2015-02-18 NOTE — Progress Notes (Signed)
Loop recorder 

## 2015-02-21 ENCOUNTER — Encounter: Admit: 2015-02-21 | Disposition: A | Discharge status: Home / Self Care | Payer: Self-pay

## 2015-02-21 LAB — HEMOGLOBIN A1C: Hgb A1c MFr Bld: 7.6 % — AB (ref 4.0–6.0)

## 2015-02-24 ENCOUNTER — Encounter: Payer: Self-pay | Admitting: Internal Medicine

## 2015-02-26 ENCOUNTER — Ambulatory Visit (INDEPENDENT_AMBULATORY_CARE_PROVIDER_SITE_OTHER): Payer: BLUE CROSS/BLUE SHIELD | Admitting: Neurology

## 2015-02-26 ENCOUNTER — Encounter: Payer: Self-pay | Admitting: Neurology

## 2015-02-26 VITALS — BP 130/71 | HR 64 | Ht 70.0 in | Wt 178.0 lb

## 2015-02-26 DIAGNOSIS — Z8673 Personal history of transient ischemic attack (TIA), and cerebral infarction without residual deficits: Secondary | ICD-10-CM | POA: Insufficient documentation

## 2015-02-26 DIAGNOSIS — E1159 Type 2 diabetes mellitus with other circulatory complications: Secondary | ICD-10-CM | POA: Diagnosis not present

## 2015-02-26 DIAGNOSIS — I63412 Cerebral infarction due to embolism of left middle cerebral artery: Secondary | ICD-10-CM | POA: Diagnosis not present

## 2015-02-26 DIAGNOSIS — I639 Cerebral infarction, unspecified: Secondary | ICD-10-CM | POA: Diagnosis not present

## 2015-02-26 DIAGNOSIS — E119 Type 2 diabetes mellitus without complications: Secondary | ICD-10-CM | POA: Insufficient documentation

## 2015-02-26 DIAGNOSIS — E785 Hyperlipidemia, unspecified: Secondary | ICD-10-CM | POA: Diagnosis not present

## 2015-02-26 MED ORDER — CLOPIDOGREL BISULFATE 75 MG PO TABS: 75.0000 mg | ORAL_TABLET | Freq: Every day | ORAL | Status: DC

## 2015-02-26 NOTE — Patient Instructions (Signed)
-   change baby ASA to plavix for stroke prevention - continue lipitor for stroke prevention - Follow up with your primary care physician for stroke risk factor modification. Recommend maintain blood pressure goal <130/80, diabetes with hemoglobin A1c goal below 6.5% and lipids with LDL cholesterol goal below 70 mg/dL.  - continue speech therapy - check glucose at home - follow up in 3 months

## 2015-02-26 NOTE — Progress Notes (Signed)
STROKE NEUROLOGY FOLLOW UP NOTE  NAME: Maxwell Clark DOB: 10-13-1956  REASON FOR VISIT: stroke follow up HISTORY FROM: pt and chart  Today we had the pleasure of seeing Maxwell Clark in follow-up at our Neurology Clinic. Pt was accompanied by no one.   History Summary Maxwell Clark is an 59 y.o. male history of diabetes was admitted on 10/15/14 for expressive aphasia. CT scan of his mute his head showed an area of likely evolving stroke involving the left frontal region. Patient had no difficulty with understanding what was being said to him and following commands, but was mute. Patient was past the time window for treatment with TPA. NIH stroke scale was 9. MRI showed left MCA broca's area stroke. And stroke work up negative including CUS, MRA, TEE, hypercoagulable labs except A1C 8.1 and LDL 115. He was put on ASA 81 and lipitor 80 as well as loop recorder. He was discharged in good condition.  Interval History During the interval time, the patient has been doing better. He followed up with speech therapist initially 3 times a week and now twice a week. His speech much improved and now only has mild expressive aphasia with dysarthria. He has no arm or leg weakness, but still right facial droop. His glucose in better control, insulin done from 25 units to 13 units now. His BP today 130/71.    REVIEW OF SYSTEMS: Full 14 system review of systems performed and notable only for those listed below and in HPI above, all others are negative:  Constitutional:   Cardiovascular:  Ear/Nose/Throat:   Skin:  Eyes:   Respiratory:   Gastroitestinal:   Genitourinary:  Hematology/Lymphatic:   Endocrine:  Musculoskeletal:   Allergy/Immunology:   Neurological:  Slurry speech Psychiatric:  Sleep:   The following represents the patient's updated allergies and side effects list: Allergies  Allergen Reactions  . Penicillins     unknown    The neurologically relevant items on the  patient's problem list were reviewed on today's visit.  Neurologic Examination  A problem focused neurological exam (12 or more points of the single system neurologic examination, vital signs counts as 1 point, cranial nerves count for 8 points) was performed.  Blood pressure 130/71, pulse 64, height  (1.778 m), weight 178 lb (80.74 kg).  General - Well nourished, well developed, in no apparent distress.  Ophthalmologic - Sharp disc margins OU.  Cardiovascular - Regular rate and rhythm with no murmur.  Mental Status -  Level of arousal and orientation to time, place, and person were intact. Language including naming, repetition, comprehension was assessed and found intact. However, he still has mild expressive aphasia and significant dysarthria. Attention span and concentration were normal. Recent and remote memory were intact. Fund of Knowledge was assessed and was intact.  Cranial Nerves II - XII - II - Visual field intact OU. III, IV, VI - Extraocular movements intact. V - Facial sensation intact bilaterally. VII - right facial droop. VIII - Hearing & vestibular intact bilaterally. X - Palate elevates symmetrically. XI - Chin turning & shoulder shrug intact bilaterally. XII - Tongue protrusion intact.  Motor Strength - The patient's strength was normal in all extremities and pronator drift was absent.  Bulk was normal and fasciculations were absent.   Motor Tone - Muscle tone was assessed at the neck and appendages and was normal.  Reflexes - The patient's reflexes were normal in all extremities and he had no pathological reflexes.  Sensory - Light touch, temperature/pinprick, vibration and proprioception, and Romberg testing were assessed and were normal.    Coordination - The patient had normal movements in the hands and feet with no ataxia or dysmetria.  Tremor was absent.  Gait and Station - The patient's transfers, posture, gait, station, and turns were observed  as normal.  Data reviewed: I personally reviewed the images and agree with the radiology interpretations.  Carotid Doppler There is 1-39% bilateral ICA stenosis. Vertebral artery flow is antegrade.   Mri and Mra brain - Acute hemorrhagic infarct in the left frontal operculum and left insular cortex. No other significant ischemic change. Mild to moderate stenosis of the left MCA bifurcation extending into the temporal branch of the left MCA. This is less severe than would be expected for acute infarct and could represent some residual thrombus. Alternately, an embolus could be the cause of this infarction.  2D echo - Normal LV size and systolic function, EF 60-65%. Normal RV size and systolic function. No significant valvular abnormalities.  TEE - Left ventricle: Systolic function was normal. The estimated ejection fraction was in the range of 55% to 60%. Wall motion was normal; there were no regional wall motion abnormalities. - Left atrium: No evidence of thrombus in the atrial cavity or appendage. - Right atrium: No evidence of thrombus in the atrial cavity or appendage. - no intracardiac shunt  Loop recorder - no Afib so far.  Component     Latest Ref Rng 10/15/2014 10/16/2014  PTT Lupus Anticoagulant     28.0 - 43.0 secs  37.3  PTTLA Confirmation     <8.0 secs  NOT APPL  PTTLA 4:1 Mix     28.0 - 43.0 secs  NOT APPL  DRVVT     <42.9 secs  34.4  Drvvt confirmation     <1.15 Ratio  NOT APPL  dRVVT Incubated 1:1 Mix     <42.9 secs  NOT APPL  Lupus Anticoagulant     NOT DETECTED  NOT DETECTED  Cholesterol     0 - 200 mg/dL 960 (H)   Triglycerides     <150 mg/dL 52   HDL     >45 mg/dL 83   Total CHOL/HDL Ratio      2.5   VLDL     0 - 40 mg/dL 10   LDL (calc)     0 - 99 mg/dL 409 (H)   Beta-2 Glyco I IgG     <20 G Units  4  Beta-2-Glycoprotein I IgM     <20 M Units  5  Beta-2-Glycoprotein I IgA     <20 A Units  1  Interpretation-F5LEID:        REPORT  Recommendations-F5LEID:       REPORT  Reviewer       REPORT  Interpretation-PTGENE:       REPORT  Recommendations-PTGENE:       REPORT  Reviewer       REPORT  Anticardiolipin IgG     <23 GPL U/mL  17  Anticardiolipin IgM     <11 MPL U/mL  0 (L)  Anticardiolipin IgA     <22 APL U/mL  5 (L)  Hemoglobin A1C     <5.7 % 8.1 (H)   Mean Plasma Glucose     <117 mg/dL 811 (H)   AntiThromb III Func     75 - 120 %  114  Protein C Activity     75 - 133 %  139 (H)  Protein C, Total     72 - 160 %  76  Protein S Activity     69 - 129 %  91  Protein S Ag, Total     60 - 150 %  86  Homocysteine     4.0 - 15.4 umol/L  10.3    Assessment: As you may recall, he is a 59 y.o. Caucasian male with PMH of DM was admitted on 10/15/14 for left MCA broca's area stroke, consistent with embolic pattern. However, stroke work up so far negative except A1C 8.1 and LDL 115. He is on ASA 81 and lipitor 80. Will change to plavix and continue lipitor. Also will do TCD MES.   Plan:  - change baby ASA to plavix for stroke prevention - continue lipitor for stroke prevention - Follow up with your primary care physician for stroke risk factor modification. Recommend maintain blood pressure goal <130/80, diabetes with hemoglobin A1c goal below 6.5% and lipids with LDL cholesterol goal below 70 mg/dL.  - continue speech therapy - TCD MES monitoring - check glucose at home - RTC in 3 months.  Orders Placed This Encounter  Procedures  . US TCD WITH BUBBLES    Standing Status: Future     Number of Occurrences:      Standing Expiration Date: 08/29/2015    Order Specific Question:  Reason for Exam (SYMPTOM  OR DIAGNOSIS REQUIRED)    Answer:  embolic stroke    Order Specific Question:  Preferred imaging location?    Answer:  Internal    Meds ordered this encounter  Medications  . metFORMIN (GLUCOPHAGE) 500 MG tablet    Sig: Take 500 mg by mouth 2 (two) times daily.    Refill:  1  . clopidogrel  (PLAVIX) 75 MG tablet    Sig: Take 1 tablet (75 mg total) by mouth daily.    Dispense:  30 tablet    Refill:  11    Patient Instructions  - change baby ASA to plavix for stroke prevention - continue lipitor for stroke prevention - Follow up with your primary care physician for stroke risk factor modification. Recommend maintain blood pressure goal <130/80, diabetes with hemoglobin A1c goal below 6.5% and lipids with LDL cholesterol goal below 70 mg/dL.  - continue speech therapy - check glucose at home - follow up in 3 months   Marvel PlanJindong Natividad Halls, MD PhD [facility] [address], [address] [place], [place] [address] [phone]

## 2015-03-06 ENCOUNTER — Telehealth: Payer: Self-pay | Admitting: Radiology

## 2015-03-06 NOTE — Telephone Encounter (Signed)
S/W patient and he said his wife would get the message I left at the home phone number and call me back to schedule Bubble study.  He has difficulty speaking.

## 2015-03-11 LAB — MDC_IDC_ENUM_SESS_TYPE_REMOTE: MDC IDC SESS DTM: 20160323040500

## 2015-03-13 ENCOUNTER — Telehealth: Payer: Self-pay | Admitting: Radiology

## 2015-03-13 NOTE — Telephone Encounter (Signed)
Spoke with patient on 03/06/15 and he said he would have his wife call me for scheduling dopplers.  I have not heard from her. 03/13/15 Called home phone and left another message to have patient or wife call for scheduling Bubble study.

## 2015-03-17 ENCOUNTER — Ambulatory Visit (INDEPENDENT_AMBULATORY_CARE_PROVIDER_SITE_OTHER): Payer: BLUE CROSS/BLUE SHIELD | Admitting: *Deleted

## 2015-03-17 ENCOUNTER — Encounter: Payer: Self-pay | Admitting: Internal Medicine

## 2015-03-17 DIAGNOSIS — I639 Cerebral infarction, unspecified: Secondary | ICD-10-CM | POA: Diagnosis not present

## 2015-03-18 DIAGNOSIS — E782 Mixed hyperlipidemia: Secondary | ICD-10-CM | POA: Insufficient documentation

## 2015-03-18 DIAGNOSIS — I639 Cerebral infarction, unspecified: Secondary | ICD-10-CM | POA: Insufficient documentation

## 2015-03-18 DIAGNOSIS — Z0000 Encounter for general adult medical examination without abnormal findings: Secondary | ICD-10-CM | POA: Insufficient documentation

## 2015-03-18 DIAGNOSIS — E1149 Type 2 diabetes mellitus with other diabetic neurological complication: Secondary | ICD-10-CM | POA: Insufficient documentation

## 2015-03-19 NOTE — Progress Notes (Signed)
Loop recorder 

## 2015-03-20 ENCOUNTER — Encounter: Payer: Self-pay | Admitting: Internal Medicine

## 2015-03-21 ENCOUNTER — Encounter: Payer: Self-pay | Admitting: Internal Medicine

## 2015-03-24 ENCOUNTER — Encounter: Payer: Self-pay | Admitting: Internal Medicine

## 2015-03-24 ENCOUNTER — Ambulatory Visit: Payer: BLUE CROSS/BLUE SHIELD | Attending: Family Medicine | Admitting: Speech Pathology

## 2015-03-24 ENCOUNTER — Encounter: Payer: Self-pay | Admitting: Speech Pathology

## 2015-03-24 DIAGNOSIS — I69390 Apraxia following cerebral infarction: Secondary | ICD-10-CM | POA: Diagnosis not present

## 2015-03-24 DIAGNOSIS — R4701 Aphasia: Secondary | ICD-10-CM

## 2015-03-24 DIAGNOSIS — I69320 Aphasia following cerebral infarction: Secondary | ICD-10-CM | POA: Diagnosis not present

## 2015-03-24 DIAGNOSIS — R482 Apraxia: Secondary | ICD-10-CM

## 2015-03-25 NOTE — Therapy (Signed)
[place] MAIN [facility] SERVICES [address] [place], [place], [address] Phone: [phone]   Fax:  [phone]  Speech Language Pathology Treatment  Patient Details  Name: Maxwell Clark MRN: [id number] Date of Birth: [birth year] Referring Provider:  Juline Patch, MD  Encounter Date: 03/24/2015      End of Session - 03/25/15 0830    Visit Number 39   Number of Visits 68   Date for SLP Re-Evaluation 04/21/15   SLP Start Time 1605   SLP Stop Time  1657   SLP Time Calculation (min) 52 min   Activity Tolerance Patient tolerated treatment well      Past Medical History  Diagnosis Date  . Diabetes mellitus without complication   . Stroke 10/15/2014    Past Surgical History  Procedure Laterality Date  . Tee without cardioversion N/A 10/18/2014    Procedure: TRANSESOPHAGEAL ECHOCARDIOGRAM (TEE);  Surgeon: Sanda Klein, MD;  Location: [facility] ENDOSCOPY;  Service: Cardiovascular;  Laterality: N/A;  loop recorder to follow   . Loop recorder implant N/A 10/18/2014    Procedure: LOOP RECORDER IMPLANT;  Surgeon: Deboraha Sprang, MD;  Location: [facility] CATH LAB;  Service: Cardiovascular;  Laterality: N/A;    There were no vitals filed for this visit.  Visit Diagnosis: Apraxia  Aphasia      Subjective Assessment - 03/24/15 1658    Subjective Patient reports functional communication in most settings "as long as I go slow!"   Currently in Pain? No/denies           SLP Evaluation [facility] - 03/24/15 1658    SLP Visit Information   SLP Received On 11/06/14   Onset Date 10/15/2014   Medical Diagnosis CVA   Prior Functional Status   Cognitive/Linguistic Baseline Within functional limits   Auditory Comprehension   Overall Auditory Comprehension Impaired   Verbal Expression   Overall Verbal Expression Impaired   Motor Speech   Motor Planning Impaired   Motor Speech Errors Groping for words;Inconsistent   Assessment   Clinical Impression  Statement At 3 weeks post onset of CVA, this 59 year old man is presenting with moderate expressive and receptive aphasia complicated by moderate verbal apraxia. The patient has made progress in communication since onset and is stimulable for improved speech. He will benefit from skilled speech therapy for restorative and compensatory treatment of speech and language impairment.   SLP Recommendation/Assessment Patient needs continued Speech Lanaguage Pathology Services   Problem List Auditory comprehension;Verbal expression            ADULT SLP TREATMENT - 03/25/15 0001    Treatment Provided   Treatment provided Cognitive-Linquistic   Pain Assessment   Pain Assessment No/denies pain   Cognitive-Linquistic Treatment   Treatment focused on Apraxia   Skilled Treatment Read aloud 10+ word sentences, 2-syllable words, words with initial /k, z/, similar word pairs, and rhyming words, maintaining phonemic accuracy, with 70% accuracy. Improved production of /sh/ phoneme in words. Maintain complex conversation with fewer overt errors in phonology and syntax and improved independence in clarifying unintelligible productions.    Assessment / Recommendations / Plan   Plan Continue with current plan of care   Progression Toward Goals   Progression toward goals Progressing toward goals          SLP Education - 03/25/15 0820    Education provided Yes   Education Details Patient given visual cues RE: plasement and manner, auditory models, counseled RE: most effective communciation  strategies, including slowing rate of speech.   Person(s) Educated Patient   Methods Demonstration;Verbal cues   Comprehension Returned demonstration          SLP Short Term Goals - 10/31/14 1628    SLP SHORT TERM GOAL #1   Title Pt will perform automatic speech tasks with 80% accuracy and occassional minimal assistance   Time 3   Status On-going   SLP SHORT TERM GOAL #2   Title Pt will repeat consonant vowel  (cv) words/syllables with 80% accuracy and usual moderate assistance   Time 3   Status On-going   SLP SHORT TERM GOAL #3   Title Pt will write family/pet names and biographical information with 70% accuracy and occassional minimal assistance   Time 3   Status On-going          SLP Long Term Goals - 03/25/15 0831    SLP LONG TERM GOAL #1   Title . Patient will generate intelligible and meaningful sentences given cognitive linguistic task. .    Time 1   Period Months   Status Partially Met   SLP LONG TERM GOAL #2   Title Patient will read aloud words, sentences and paragraphs, maintaining phonemic accuracy, with 80% accuracy.    Time 1   Period Months   Status Partially Met          Plan - 03/25/15 1610    Clinical Impression Statement The patient has made measurable progress in communicative competence. Re-testing with the Western Aphasia Battery-Revised shows improvement in all language measurements. His Aphasia Quotient improved from 52.7 (moderate-severe) to 82.1 (mild). The patient is able to generate intelligible, meaningful sentences in conversation with overt errors in phonology, semantics, and syntax. These error are reduced when patient is in a more structured task. The patient will benefit from continued speech therapy to improved communication skills. In view of the progress to date, motivation, and family support, it is expected that the patient will continue to make gains in communication skills.   Speech Therapy Frequency 2x / week   Duration 4 weeks   Treatment/Interventions SLP instruction and feedback;Patient/family education;Language facilitation   Potential to Achieve Goals Good   Potential Considerations Ability to learn/carryover information;Cooperation/participation level;Previous level of function;Family/community support   SLP Home Exercise Plan atient has practice materials for continued practice at home   Consulted and Agree with Plan of Care Patient         Problem List Patient Active Problem List   Diagnosis Date Noted  . Routine general medical examination at a health care facility 03/18/2015  . Type II diabetes mellitus with neurological manifestations 03/18/2015  . Ischemic stroke 03/18/2015  . Combined fat and carbohydrate induced hyperlipemia 03/18/2015  . Cerebral infarction due to embolism of left middle cerebral artery 02/26/2015  . Type 2 diabetes mellitus with other circulatory complications 96/02/5408  . HLD (hyperlipidemia) 02/26/2015  . Dyslipidemia, goal LDL below 70 10/18/2014  . Acute ischemic stroke 10/15/2014  . Aphasia due to stroke 10/15/2014  . Stroke 10/15/2014  . Diabetes mellitus type 2, uncontrolled 10/15/2014    [name] 03/25/2015, 8:35 AM  [place] MAIN [facility] SERVICES [address] [place], [place], [address] Phone: [phone]   Fax:  [phone]

## 2015-03-26 ENCOUNTER — Ambulatory Visit: Payer: BLUE CROSS/BLUE SHIELD | Admitting: Speech Pathology

## 2015-03-28 ENCOUNTER — Telehealth: Payer: Self-pay | Admitting: Radiology

## 2015-03-28 NOTE — Telephone Encounter (Signed)
Called home and cell number and left voice message that the bubble study has been changed to May 12th at 8:30 with arrival time of 8:15.  Please call the office and ask for Fannie KneeSue so I know you received this message.

## 2015-03-31 ENCOUNTER — Ambulatory Visit: Payer: BLUE CROSS/BLUE SHIELD | Admitting: Speech Pathology

## 2015-03-31 ENCOUNTER — Encounter: Payer: Self-pay | Admitting: Speech Pathology

## 2015-03-31 DIAGNOSIS — I69320 Aphasia following cerebral infarction: Secondary | ICD-10-CM | POA: Diagnosis not present

## 2015-03-31 DIAGNOSIS — R482 Apraxia: Secondary | ICD-10-CM

## 2015-03-31 DIAGNOSIS — R4701 Aphasia: Secondary | ICD-10-CM

## 2015-03-31 NOTE — Therapy (Signed)
[place] MAIN [facility] SERVICES [address] [place], [place], [address] Phone: [phone]   Fax:  [phone]  Speech Language Pathology Treatment  Patient Details  Name: Maxwell Clark MRN: [id number] Date of Birth: [birth year] Referring Provider:  Juline Patch, MD  Encounter Date: 03/31/2015      End of Session - 03/31/15 1635    Visit Number 40   Number of Visits 47   Date for SLP Re-Evaluation 04/21/15   SLP Start Time 1502   SLP Stop Time  1559   SLP Time Calculation (min) 57 min   Activity Tolerance Patient tolerated treatment well      Past Medical History  Diagnosis Date  . Diabetes mellitus without complication   . Stroke 10/15/2014    Past Surgical History  Procedure Laterality Date  . Tee without cardioversion N/A 10/18/2014    Procedure: TRANSESOPHAGEAL ECHOCARDIOGRAM (TEE);  Surgeon: Sanda Klein, MD;  Location: [facility] ENDOSCOPY;  Service: Cardiovascular;  Laterality: N/A;  loop recorder to follow   . Loop recorder implant N/A 10/18/2014    Procedure: LOOP RECORDER IMPLANT;  Surgeon: Deboraha Sprang, MD;  Location: [facility] CATH LAB;  Service: Cardiovascular;  Laterality: N/A;    There were no vitals filed for this visit.  Visit Diagnosis: Apraxia  Aphasia      Subjective Assessment - 03/31/15 1633    Subjective "I've been talking up a storm"   Currently in Pain? No/denies               ADULT SLP TREATMENT - 03/31/15 1621    General Information   HPI CVA with verbal apraxia   Treatment Provided   Treatment provided Cognitive-Linguistic   Pain Assessment   Pain Assessment No/denies pain   Cognitive-Linguistic Treatment   Treatment focused on Apraxia   Skilled Treatment Answer common knowledge questions, read aloud 10+ word sentences, 2-syllable words, and words with initial /j, h, k pl/, similar word pairs, maintaining phonemic accuracy, with 70% accuracy. Improved production of /sh/ phoneme in words.  Maintain complex conversation with fewer overt errors in phonology and syntax and improved independence in clarifying unintelligible productions.     Assessment / Recommendations / Plan   Plan Continue with current plan of care   Progression Toward Goals   Progression toward goals Progressing toward goals          SLP Education - 03/31/15 1633    Education provided Yes   Education Details Patient given visual cues RE: placement and manner, auditory models   Person(s) Educated Patient   Methods Explanation;Demonstration;Verbal cues   Comprehension Returned demonstration          SLP Short Term Goals - 10/31/14 1628    SLP SHORT TERM GOAL #1   Title Pt will perform automatic speech tasks with 80% accuracy and occassional minimal assistance   Time 3   Status On-going   SLP SHORT TERM GOAL #2   Title Pt will repeat consonant vowel (cv) words/syllables with 80% accuracy and usual moderate assistance   Time 3   Status On-going   SLP SHORT TERM GOAL #3   Title Pt will write family/pet names and biographical information with 70% accuracy and occassional minimal assistance   Time 3   Status On-going          SLP Long Term Goals - 03/25/15 0831    SLP LONG TERM GOAL #1   Title . Patient will generate intelligible and meaningful sentences  given cognitive linguistic task. .    Time 1   Period Months   Status Partially Met   SLP LONG TERM GOAL #2   Title Patient will read aloud words, sentences and paragraphs, maintaining phonemic accuracy, with 80% accuracy.    Time 1   Period Months   Status Partially Met          Plan - 03/31/15 1636    Clinical Impression Statement The patient continues to make gains in communicative competence.  He is able to participate in more complex linguistic tasks and maintain functional intelligibility for the setting.   Speech Therapy Frequency 2x / week   Duration 4 weeks   Treatment/Interventions SLP instruction and  feedback;Patient/family education;Language facilitation   Potential to Achieve Goals Good   Potential Considerations Ability to learn/carryover information;Cooperation/participation level;Previous level of function;Family/community support   SLP Home Exercise Plan Patient has practice materials for continued practice at home   Consulted and Agree with Plan of Care Patient        Problem List Patient Active Problem List   Diagnosis Date Noted  . Routine general medical examination at a health care facility 03/18/2015  . Type II diabetes mellitus with neurological manifestations 03/18/2015  . Ischemic stroke 03/18/2015  . Combined fat and carbohydrate induced hyperlipemia 03/18/2015  . Cerebral infarction due to embolism of left middle cerebral artery 02/26/2015  . Type 2 diabetes mellitus with other circulatory complications 59/92/3414  . HLD (hyperlipidemia) 02/26/2015  . Dyslipidemia, goal LDL below 70 10/18/2014  . Acute ischemic stroke 10/15/2014  . Aphasia due to stroke 10/15/2014  . Stroke 10/15/2014  . Diabetes mellitus type 2, uncontrolled 10/15/2014    [name] 03/31/2015, 4:43 PM  [place] MAIN [facility] SERVICES [address] [place], [place], [address] Phone: [phone]   Fax:  [phone]

## 2015-04-03 ENCOUNTER — Ambulatory Visit: Payer: BLUE CROSS/BLUE SHIELD | Admitting: Speech Pathology

## 2015-04-03 ENCOUNTER — Ambulatory Visit (INDEPENDENT_AMBULATORY_CARE_PROVIDER_SITE_OTHER): Payer: BLUE CROSS/BLUE SHIELD

## 2015-04-03 DIAGNOSIS — I639 Cerebral infarction, unspecified: Secondary | ICD-10-CM | POA: Diagnosis not present

## 2015-04-04 ENCOUNTER — Other Ambulatory Visit: Payer: BLUE CROSS/BLUE SHIELD

## 2015-04-07 ENCOUNTER — Ambulatory Visit: Payer: BLUE CROSS/BLUE SHIELD | Admitting: Speech Pathology

## 2015-04-07 DIAGNOSIS — R4701 Aphasia: Secondary | ICD-10-CM

## 2015-04-07 DIAGNOSIS — R482 Apraxia: Secondary | ICD-10-CM

## 2015-04-07 DIAGNOSIS — I69320 Aphasia following cerebral infarction: Secondary | ICD-10-CM | POA: Diagnosis not present

## 2015-04-08 ENCOUNTER — Encounter: Payer: Self-pay | Admitting: Speech Pathology

## 2015-04-08 NOTE — Therapy (Signed)
[place] MAIN [facility] SERVICES [address] [place], [place], [address] Phone: [phone]   Fax:  [phone]  Speech Language Pathology Treatment  Patient Details  Name: Maxwell Clark MRN: [id number] Date of Birth: 25-Sep-1956 Referring Provider:  Juline Patch, MD  Encounter Date: 04/07/2015      End of Session - 04/08/15 1200    Visit Number 41   Number of Visits 59   Date for SLP Re-Evaluation 04/21/15   SLP Start Time 52   SLP Stop Time  1656   SLP Time Calculation (min) 56 min   Activity Tolerance Patient tolerated treatment well      Past Medical History  Diagnosis Date  . Diabetes mellitus without complication   . Stroke 10/15/2014    Past Surgical History  Procedure Laterality Date  . Tee without cardioversion N/A 10/18/2014    Procedure: TRANSESOPHAGEAL ECHOCARDIOGRAM (TEE);  Surgeon: Sanda Klein, MD;  Location: [facility] ENDOSCOPY;  Service: Cardiovascular;  Laterality: N/A;  loop recorder to follow   . Loop recorder implant N/A 10/18/2014    Procedure: LOOP RECORDER IMPLANT;  Surgeon: Deboraha Sprang, MD;  Location: [facility] CATH LAB;  Service: Cardiovascular;  Laterality: N/A;    There were no vitals filed for this visit.  Visit Diagnosis: Apraxia  Aphasia             ADULT SLP TREATMENT - 04/08/15 0001    General Information   Behavior/Cognition Alert;Cooperative;Pleasant mood   HPI CVA with verbal apraxia   Treatment Provided   Treatment provided Cognitive-Linquistic   Pain Assessment   Pain Assessment No/denies pain   Cognitive-Linquistic Treatment   Treatment focused on Apraxia   Skilled Treatment Read aloud 10+ word sentences, 3-syllable words, words with initial /sh, kl, pl, st/, similar word pairs, and rhyming words, maintaining phonemic accuracy, with 70% accuracy. Maintain complex conversation with fewer overt errors in phonology and syntax and improved independence in clarifying unintelligible  productions.   Assessment / Recommendations / Plan   Plan Continue with current plan of care   Progression Toward Goals   Progression toward goals Progressing toward goals          SLP Education - 04/08/15 1158    Education provided Yes   Education Details Patient given visual cues RE: placement and manner, auditory/visual models   Person(s) Educated Patient   Methods Explanation;Demonstration;Verbal cues   Comprehension Returned demonstration          SLP Short Term Goals - 10/31/14 1628    SLP SHORT TERM GOAL #1   Title Pt will perform automatic speech tasks with 80% accuracy and occassional minimal assistance   Time 3   Status On-going   SLP SHORT TERM GOAL #2   Title Pt will repeat consonant vowel (cv) words/syllables with 80% accuracy and usual moderate assistance   Time 3   Status On-going   SLP SHORT TERM GOAL #3   Title Pt will write family/pet names and biographical information with 70% accuracy and occassional minimal assistance   Time 3   Status On-going          SLP Long Term Goals - 03/25/15 0831    SLP LONG TERM GOAL #1   Title . Patient will generate intelligible and meaningful sentences given cognitive linguistic task. .    Time 1   Period Months   Status Partially Met   SLP LONG TERM GOAL #2   Title Patient will read aloud words,  sentences and paragraphs, maintaining phonemic accuracy, with 80% accuracy.    Time 1   Period Months   Status Partially Met          Plan - 04/08/15 1200    Clinical Impression Statement The patient continues to make gains in communicative competence.  He is able to participate in more complex linguistic tasks and maintain fucntional intelligibility for the setting.   Speech Therapy Frequency 2x / week   Duration 4 weeks   Treatment/Interventions SLP instruction and feedback;Patient/family education;Language facilitation   Potential to Achieve Goals Good   Potential Considerations Ability to learn/carryover  information;Cooperation/participation level;Previous level of function;Family/community support   SLP Home Exercise Plan Patient has practice materials for continued practice at home   Consulted and Agree with Plan of Care Patient        Problem List Patient Active Problem List   Diagnosis Date Noted  . Routine general medical examination at a health care facility 03/18/2015  . Type II diabetes mellitus with neurological manifestations 03/18/2015  . Ischemic stroke 03/18/2015  . Combined fat and carbohydrate induced hyperlipemia 03/18/2015  . Cerebral infarction due to embolism of left middle cerebral artery 02/26/2015  . Type 2 diabetes mellitus with other circulatory complications 94/76/5465  . HLD (hyperlipidemia) 02/26/2015  . Dyslipidemia, goal LDL below 70 10/18/2014  . Acute ischemic stroke 10/15/2014  . Aphasia due to stroke 10/15/2014  . Stroke 10/15/2014  . Diabetes mellitus type 2, uncontrolled 10/15/2014   Maxwell Sea, MS/CCC- SLP  [name] 04/08/2015, 12:01 PM  [place] MAIN [facility] SERVICES [address] [place], [place], [address] Phone: [phone]   Fax:  [phone]

## 2015-04-10 ENCOUNTER — Ambulatory Visit: Payer: BLUE CROSS/BLUE SHIELD | Admitting: Speech Pathology

## 2015-04-10 LAB — CUP PACEART REMOTE DEVICE CHECK: Date Time Interrogation Session: 20160414040500

## 2015-04-14 ENCOUNTER — Ambulatory Visit: Payer: BLUE CROSS/BLUE SHIELD | Admitting: Speech Pathology

## 2015-04-14 ENCOUNTER — Encounter: Payer: Self-pay | Admitting: Speech Pathology

## 2015-04-14 DIAGNOSIS — R482 Apraxia: Secondary | ICD-10-CM

## 2015-04-14 DIAGNOSIS — I69320 Aphasia following cerebral infarction: Secondary | ICD-10-CM | POA: Diagnosis not present

## 2015-04-14 NOTE — Therapy (Signed)
[place] MAIN [facility] SERVICES [address] [place], [place], [address] Phone: [phone]   Fax:  [phone]  Speech Language Pathology Discharge Summary  Patient Details  Name: Maxwell Clark MRN: [id number] Date of Birth: 05/09/1956 Referring Provider:  Juline Patch, MD  Encounter Date: 04/14/2015      End of Session - 04/14/15 1617    Visit Number 42   Number of Visits 42   Date for SLP Re-Evaluation 04/14/15   SLP Start Time 1501   SLP Stop Time  3532   SLP Time Calculation (min) 54 min   Activity Tolerance Patient tolerated treatment well      Past Medical History  Diagnosis Date  . Diabetes mellitus without complication   . Stroke 10/15/2014    Past Surgical History  Procedure Laterality Date  . Tee without cardioversion N/A 10/18/2014    Procedure: TRANSESOPHAGEAL ECHOCARDIOGRAM (TEE);  Surgeon: Sanda Klein, MD;  Location: [facility] ENDOSCOPY;  Service: Cardiovascular;  Laterality: N/A;  loop recorder to follow   . Loop recorder implant N/A 10/18/2014    Procedure: LOOP RECORDER IMPLANT;  Surgeon: Deboraha Sprang, MD;  Location: [facility] CATH LAB;  Service: Cardiovascular;  Laterality: N/A;    There were no vitals filed for this visit.  Visit Diagnosis: Apraxia  Verbal apraxia      Subjective Assessment - 04/14/15 1614    Subjective Patient reports functional self-expression with family, friends, and customers.   Currently in Pain? No/denies           ADULT SLP TREATMENT - 04/14/15 1610    General Information   Behavior/Cognition Alert;Cooperative;Pleasant mood   HPI CVA with verbal apraxia   Treatment Provided   Treatment provided Cognitive-Linquistic   Pain Assessment   Pain Assessment No/denies pain   Cognitive-Linquistic Treatment   Treatment focused on Apraxia   Skilled Treatment Read aloud 10+ word sentences, 3-syllable words, words with initial /sh, kl, pl, st/, similar word pairs, and rhyming words,  maintaining phonemic accuracy, with overall 80% accuracy. Maintain complex conversation with fewer overt errors in phonology and syntax and improved independence in clarifying unintelligible productions.   Assessment / Recommendations / Plan   Plan Discharge SLP treatment due to (comment)   Progression Toward Goals   Progression toward goals Goals met, education completed, patient discharged from [facility] - 04/14/15 1615    Education Details Patient given visual cues RE: placement and manner, auditory/visual models.  Patient given verbal teaching regarding use of circumlocution and gesture to supplement when precise word (or sound sequence) eludes him.    Person(s) Educated Patient   Methods Explanation;Demonstration;Verbal cues   Comprehension Returned demonstration               SLP Long Term Goals - 04/14/15 1611    SLP LONG TERM GOAL #1   Title . Patient will generate intelligible and meaningful sentences given cognitive linguistic task. .    Status Achieved   SLP LONG TERM GOAL #2   Title Patient will read aloud words, sentences and paragraphs, maintaining phonemic accuracy, with 80% accuracy.    Status Achieved   SLP LONG TERM GOAL #3   Title Pt will write 2-3 word functional phrases with 80% accuracy and occassional minimal assistance   Status Not Met          Plan - 04/14/15 1617    Clinical Impression Statement The patient has  made significant gains in communicative competence.  He is able to participate in more complex linguistic tasks and maintain fucntional intelligibility across settings.   Speech Therapy Frequency Other (comment)  Discharge today   Duration Other (comment)  Discharge today   Treatment/Interventions SLP instruction and feedback;Patient/family education;Language facilitation   Potential to Achieve Goals Good   Potential Considerations Ability to learn/carryover information;Cooperation/participation level;Previous level of  function;Family/community support   SLP Home Exercise Plan Patient has practice materials for continued practice at home   Consulted and Agree with Plan of Care Patient      Problem List Patient Active Problem List   Diagnosis Date Noted  . Routine general medical examination at a health care facility 03/18/2015  . Type II diabetes mellitus with neurological manifestations 03/18/2015  . Ischemic stroke 03/18/2015  . Combined fat and carbohydrate induced hyperlipemia 03/18/2015  . Cerebral infarction due to embolism of left middle cerebral artery 02/26/2015  . Type 2 diabetes mellitus with other circulatory complications 82/57/4935  . HLD (hyperlipidemia) 02/26/2015  . Dyslipidemia, goal LDL below 70 10/18/2014  . Acute ischemic stroke 10/15/2014  . Aphasia due to stroke 10/15/2014  . Stroke 10/15/2014  . Diabetes mellitus type 2, uncontrolled 10/15/2014     Plan: Patient agrees to discharge.  Patient goals were met. Patient is being discharged due to meeting the stated rehab goals.  ?????    Leroy Sea, MS/CCC- SLP    [name] 04/14/2015, 4:20 PM  [place] MAIN [facility] SERVICES [address] [place], [place], [address] Phone: [phone]   Fax:  [phone]

## 2015-04-16 ENCOUNTER — Ambulatory Visit (INDEPENDENT_AMBULATORY_CARE_PROVIDER_SITE_OTHER): Payer: BLUE CROSS/BLUE SHIELD | Admitting: *Deleted

## 2015-04-16 ENCOUNTER — Ambulatory Visit: Payer: BLUE CROSS/BLUE SHIELD | Admitting: Speech Pathology

## 2015-04-16 ENCOUNTER — Encounter: Payer: Self-pay | Admitting: Internal Medicine

## 2015-04-16 DIAGNOSIS — I639 Cerebral infarction, unspecified: Secondary | ICD-10-CM

## 2015-04-18 NOTE — Progress Notes (Signed)
Loop recorder 

## 2015-04-29 LAB — CUP PACEART REMOTE DEVICE CHECK: Date Time Interrogation Session: 20160510040500

## 2015-05-16 ENCOUNTER — Ambulatory Visit (INDEPENDENT_AMBULATORY_CARE_PROVIDER_SITE_OTHER): Payer: BLUE CROSS/BLUE SHIELD | Admitting: *Deleted

## 2015-05-16 ENCOUNTER — Encounter: Payer: Self-pay | Admitting: Internal Medicine

## 2015-05-16 DIAGNOSIS — I639 Cerebral infarction, unspecified: Secondary | ICD-10-CM | POA: Diagnosis not present

## 2015-05-17 ENCOUNTER — Encounter: Payer: Self-pay | Admitting: Internal Medicine

## 2015-05-19 ENCOUNTER — Other Ambulatory Visit: Payer: Self-pay

## 2015-05-19 DIAGNOSIS — I639 Cerebral infarction, unspecified: Secondary | ICD-10-CM

## 2015-05-19 DIAGNOSIS — E109 Type 1 diabetes mellitus without complications: Secondary | ICD-10-CM

## 2015-05-19 MED ORDER — METFORMIN HCL 500 MG PO TABS
500.0000 mg | ORAL_TABLET | Freq: Two times a day (BID) | ORAL | Status: DC
Start: 2015-05-19 — End: 2015-05-21

## 2015-05-20 LAB — CUP PACEART REMOTE DEVICE CHECK: MDC IDC SESS DTM: 20160628120823

## 2015-05-21 ENCOUNTER — Other Ambulatory Visit: Payer: Self-pay

## 2015-05-21 DIAGNOSIS — I639 Cerebral infarction, unspecified: Secondary | ICD-10-CM

## 2015-05-21 DIAGNOSIS — E109 Type 1 diabetes mellitus without complications: Secondary | ICD-10-CM

## 2015-05-21 MED ORDER — METFORMIN HCL 500 MG PO TABS: 500.0000 mg | ORAL_TABLET | Freq: Two times a day (BID) | ORAL | Status: DC

## 2015-05-21 NOTE — Progress Notes (Signed)
Loop recorder 

## 2015-05-30 ENCOUNTER — Other Ambulatory Visit: Payer: BLUE CROSS/BLUE SHIELD

## 2015-05-30 DIAGNOSIS — E109 Type 1 diabetes mellitus without complications: Secondary | ICD-10-CM

## 2015-05-31 LAB — HEMOGLOBIN A1C
Est. average glucose Bld gHb Est-mCnc: 166 mg/dL
HEMOGLOBIN A1C: 7.4 % — AB (ref 4.8–5.6)

## 2015-06-01 ENCOUNTER — Other Ambulatory Visit: Payer: Self-pay | Admitting: Family Medicine

## 2015-06-01 DIAGNOSIS — E785 Hyperlipidemia, unspecified: Secondary | ICD-10-CM

## 2015-06-12 ENCOUNTER — Encounter: Payer: Self-pay | Admitting: Internal Medicine

## 2015-06-16 ENCOUNTER — Ambulatory Visit (INDEPENDENT_AMBULATORY_CARE_PROVIDER_SITE_OTHER): Payer: BLUE CROSS/BLUE SHIELD | Admitting: *Deleted

## 2015-06-16 DIAGNOSIS — I639 Cerebral infarction, unspecified: Secondary | ICD-10-CM | POA: Diagnosis not present

## 2015-06-24 ENCOUNTER — Encounter: Payer: Self-pay | Admitting: Dietician

## 2015-07-01 NOTE — Progress Notes (Signed)
Loop recorder 

## 2015-07-04 LAB — CUP PACEART REMOTE DEVICE CHECK: MDC IDC SESS DTM: 20160812170824

## 2015-07-07 ENCOUNTER — Other Ambulatory Visit: Payer: Self-pay | Admitting: Family Medicine

## 2015-07-07 ENCOUNTER — Other Ambulatory Visit: Payer: Self-pay

## 2015-07-07 DIAGNOSIS — E119 Type 2 diabetes mellitus without complications: Secondary | ICD-10-CM

## 2015-07-08 ENCOUNTER — Encounter: Payer: Self-pay | Admitting: Internal Medicine

## 2015-07-09 ENCOUNTER — Telehealth: Payer: Self-pay | Admitting: *Deleted

## 2015-07-09 NOTE — Telephone Encounter (Signed)
[facility] requesting call back regarding device reprogramming.

## 2015-07-10 NOTE — Telephone Encounter (Signed)
Patient's wife returned phone call.  States she will call patient and see when he is available for an appointment to come into the Device Clinic and call us back.

## 2015-07-11 NOTE — Telephone Encounter (Signed)
Pt's spouse states okay to come to device clinic Fri 07/25/15 per Irving Burton. Appt made to reprogram. Spouse aware to come to [place] not [place].  ROV w/ Device clinic 07/25/15 :00.

## 2015-07-15 ENCOUNTER — Encounter: Payer: Self-pay | Admitting: Internal Medicine

## 2015-07-15 ENCOUNTER — Ambulatory Visit (INDEPENDENT_AMBULATORY_CARE_PROVIDER_SITE_OTHER): Payer: BLUE CROSS/BLUE SHIELD | Admitting: *Deleted

## 2015-07-15 DIAGNOSIS — I639 Cerebral infarction, unspecified: Secondary | ICD-10-CM | POA: Diagnosis not present

## 2015-07-17 NOTE — Progress Notes (Signed)
Loop recorder 

## 2015-07-21 LAB — CUP PACEART REMOTE DEVICE CHECK: Date Time Interrogation Session: 20160829095727

## 2015-07-25 ENCOUNTER — Ambulatory Visit (INDEPENDENT_AMBULATORY_CARE_PROVIDER_SITE_OTHER): Payer: BLUE CROSS/BLUE SHIELD | Admitting: *Deleted

## 2015-07-25 ENCOUNTER — Encounter: Payer: Self-pay | Admitting: Internal Medicine

## 2015-07-25 DIAGNOSIS — I639 Cerebral infarction, unspecified: Secondary | ICD-10-CM

## 2015-07-25 LAB — CUP PACEART INCLINIC DEVICE CHECK
Date Time Interrogation Session: 20160902181142
MDC IDC SET ZONE DETECTION INTERVAL: 3000 ms
Zone Setting Detection Interval: 2000 ms
Zone Setting Detection Interval: 350 ms

## 2015-07-25 NOTE — Progress Notes (Signed)
Loop check in clinic.  Pt with 0 tachy episodes; 6 brady episodes---undersensing; 360 asystole episodes---undersensing; 0 symptom episodes; 7 AT/AF episodes (<0.1%)---last 8/14---previously reviewed/oversensing. Brady/Pause detection d/c'd this ov. Plan to follow up via [facility] and with SK/B in 09-2015.

## 2015-08-14 ENCOUNTER — Ambulatory Visit (INDEPENDENT_AMBULATORY_CARE_PROVIDER_SITE_OTHER): Payer: BLUE CROSS/BLUE SHIELD | Admitting: *Deleted

## 2015-08-14 ENCOUNTER — Encounter: Payer: Self-pay | Admitting: Internal Medicine

## 2015-08-14 DIAGNOSIS — I639 Cerebral infarction, unspecified: Secondary | ICD-10-CM

## 2015-08-18 NOTE — Progress Notes (Signed)
Loop recorder 

## 2015-08-19 ENCOUNTER — Other Ambulatory Visit: Payer: Self-pay | Admitting: Family Medicine

## 2015-08-19 DIAGNOSIS — E119 Type 2 diabetes mellitus without complications: Secondary | ICD-10-CM

## 2015-08-29 ENCOUNTER — Other Ambulatory Visit: Payer: Self-pay | Admitting: Family Medicine

## 2015-09-02 ENCOUNTER — Encounter: Payer: Self-pay | Admitting: Internal Medicine

## 2015-09-09 LAB — CUP PACEART REMOTE DEVICE CHECK: Date Time Interrogation Session: 20160922173732

## 2015-09-09 NOTE — Progress Notes (Signed)
[facility] summary report received. Battery status OK. Normal device function. No new symptom episodes, tachy episodes, or brady. 87 pauses---all undersensing. 2 AF episodes, 0.0%, 1 EGM showing 8min w/ irreg R-R & undersensing---needs reviewed. Monthly summary reports and due to see SK 11/16.

## 2015-09-11 ENCOUNTER — Encounter: Payer: Self-pay | Admitting: Internal Medicine

## 2015-09-15 ENCOUNTER — Ambulatory Visit (INDEPENDENT_AMBULATORY_CARE_PROVIDER_SITE_OTHER): Payer: BLUE CROSS/BLUE SHIELD | Admitting: *Deleted

## 2015-09-15 DIAGNOSIS — I6389 Other cerebral infarction: Secondary | ICD-10-CM

## 2015-09-15 DIAGNOSIS — I638 Other cerebral infarction: Secondary | ICD-10-CM | POA: Diagnosis not present

## 2015-09-16 NOTE — Progress Notes (Signed)
Loop recorder 

## 2015-10-13 ENCOUNTER — Ambulatory Visit (INDEPENDENT_AMBULATORY_CARE_PROVIDER_SITE_OTHER): Payer: BLUE CROSS/BLUE SHIELD | Admitting: *Deleted

## 2015-10-13 DIAGNOSIS — I6389 Other cerebral infarction: Secondary | ICD-10-CM

## 2015-10-13 DIAGNOSIS — I638 Other cerebral infarction: Secondary | ICD-10-CM | POA: Diagnosis not present

## 2015-10-14 NOTE — Progress Notes (Signed)
Carelink Summary Report / Loop Recorder 

## 2015-10-17 LAB — CUP PACEART REMOTE DEVICE CHECK: Date Time Interrogation Session: 20161022180556

## 2015-10-17 NOTE — Progress Notes (Signed)
Carelink summary report received. Battery status OK. Normal device function. No new symptom or tachy episodes. Brady and pause detection off. 7 AF episodes, inappropriate, all ECGs suggest SR, some oversensing due to artifact, undersensing due to variable R-wave amplitude, and rare T-wave oversensing. Monthly summary reports and ROV with SK in 09/2015 (recall letter sent).

## 2015-10-28 ENCOUNTER — Other Ambulatory Visit: Payer: Self-pay | Admitting: Family Medicine

## 2015-10-30 ENCOUNTER — Encounter: Payer: Self-pay | Admitting: *Deleted

## 2015-11-06 ENCOUNTER — Other Ambulatory Visit: Payer: Self-pay | Admitting: Family Medicine

## 2015-11-12 ENCOUNTER — Ambulatory Visit (INDEPENDENT_AMBULATORY_CARE_PROVIDER_SITE_OTHER): Payer: BLUE CROSS/BLUE SHIELD | Admitting: *Deleted

## 2015-11-12 DIAGNOSIS — I6389 Other cerebral infarction: Secondary | ICD-10-CM

## 2015-11-12 DIAGNOSIS — I638 Other cerebral infarction: Secondary | ICD-10-CM | POA: Diagnosis not present

## 2015-11-13 NOTE — Progress Notes (Signed)
Carelink Summary Report / Loop Recorder 

## 2015-11-20 LAB — CUP PACEART REMOTE DEVICE CHECK: MDC IDC SESS DTM: 20161121180814

## 2015-11-25 ENCOUNTER — Other Ambulatory Visit: Payer: Self-pay | Admitting: Family Medicine

## 2015-11-29 LAB — CUP PACEART REMOTE DEVICE CHECK: MDC IDC SESS DTM: 20161221180836

## 2015-12-02 ENCOUNTER — Ambulatory Visit: Payer: BLUE CROSS/BLUE SHIELD | Admitting: Family Medicine

## 2015-12-11 ENCOUNTER — Encounter: Payer: Self-pay | Admitting: Family Medicine

## 2015-12-11 ENCOUNTER — Ambulatory Visit (INDEPENDENT_AMBULATORY_CARE_PROVIDER_SITE_OTHER): Payer: BLUE CROSS/BLUE SHIELD | Admitting: Family Medicine

## 2015-12-11 VITALS — BP 120/70 | HR 78 | Ht 70.0 in | Wt 168.0 lb

## 2015-12-11 DIAGNOSIS — I63319 Cerebral infarction due to thrombosis of unspecified middle cerebral artery: Secondary | ICD-10-CM

## 2015-12-11 DIAGNOSIS — E118 Type 2 diabetes mellitus with unspecified complications: Secondary | ICD-10-CM

## 2015-12-11 DIAGNOSIS — N400 Enlarged prostate without lower urinary tract symptoms: Secondary | ICD-10-CM | POA: Diagnosis not present

## 2015-12-11 DIAGNOSIS — Z1211 Encounter for screening for malignant neoplasm of colon: Secondary | ICD-10-CM

## 2015-12-11 DIAGNOSIS — Z794 Long term (current) use of insulin: Secondary | ICD-10-CM

## 2015-12-11 DIAGNOSIS — E785 Hyperlipidemia, unspecified: Secondary | ICD-10-CM | POA: Diagnosis not present

## 2015-12-11 DIAGNOSIS — I679 Cerebrovascular disease, unspecified: Secondary | ICD-10-CM | POA: Diagnosis not present

## 2015-12-11 DIAGNOSIS — E109 Type 1 diabetes mellitus without complications: Secondary | ICD-10-CM

## 2015-12-11 LAB — HEMOCCULT GUIAC POC 1CARD (OFFICE): Fecal Occult Blood, POC: NEGATIVE

## 2015-12-11 MED ORDER — ATORVASTATIN CALCIUM 80 MG PO TABS: 80.0000 mg | ORAL_TABLET | Freq: Every day | ORAL | Status: DC

## 2015-12-11 MED ORDER — CLOPIDOGREL BISULFATE 75 MG PO TABS: 75.0000 mg | ORAL_TABLET | Freq: Every day | ORAL | Status: DC

## 2015-12-11 MED ORDER — METFORMIN HCL 500 MG PO TABS: 500.0000 mg | ORAL_TABLET | Freq: Two times a day (BID) | ORAL | Status: DC

## 2015-12-11 MED ORDER — INSULIN GLARGINE 100 UNIT/ML SOLOSTAR PEN: PEN_INJECTOR | SUBCUTANEOUS | Status: DC

## 2015-12-11 NOTE — Progress Notes (Signed)
Name: Maxwell Clark   MRN: [id number]    DOB: April 27, 1956   Date:12/11/2015       Progress Note  Subjective  Chief Complaint  Chief Complaint  Patient presents with  . Diabetes  . Hyperlipidemia  . Cerebrovascular Accident    Diabetes He presents for his follow-up diabetic visit. He has type 2 diabetes mellitus. His disease course has been improving. There are no hypoglycemic associated symptoms. Pertinent negatives for hypoglycemia include no dizziness, headaches or nervousness/anxiousness. ("feel it coming on") There are no diabetic associated symptoms. Pertinent negatives for diabetes include no blurred vision, no chest pain, no fatigue, no foot paresthesias, no foot ulcerations, no polydipsia, no polyphagia, no polyuria, no visual change, no weakness and no weight loss. There are no hypoglycemic complications. Symptoms are stable. There are no diabetic complications. Risk factors for coronary artery disease include dyslipidemia and diabetes mellitus. Current diabetic treatment includes oral agent (monotherapy) and insulin injections. He is compliant with treatment all of the time. He is following a generally healthy diet. There is no change in his home blood glucose trend. His breakfast blood glucose range is generally 90-110 mg/dl. He does not see a podiatrist.Eye exam is not current.  Hyperlipidemia This is a chronic problem. The current episode started more than 1 month ago. The problem is controlled. Recent lipid tests were reviewed and are normal. Exacerbating diseases include diabetes. He has no history of chronic renal disease or obesity. There are no known factors aggravating his hyperlipidemia. Pertinent negatives include no chest pain, focal weakness, myalgias or shortness of breath. Current antihyperlipidemic treatment includes statins. The current treatment provides moderate improvement of lipids. There are no compliance problems.   Cerebrovascular Accident The current episode  started more than 1 year ago. The problem has been gradually improving. Pertinent negatives include no abdominal pain, chest pain, chills, coughing, fatigue, fever, headaches, myalgias, nausea, neck pain, numbness, rash, sore throat, visual change or weakness. Treatments tried: plavix. The treatment provided moderate relief.    No problem-specific assessment & plan notes found for this encounter.   Past Medical History  Diagnosis Date  . Diabetes mellitus without complication (HCC)   . Stroke (HCC) 10/15/2014  . Hyperlipidemia     Past Surgical History  Procedure Laterality Date  . Tee without cardioversion N/A 10/18/2014    Procedure: TRANSESOPHAGEAL ECHOCARDIOGRAM (TEE);  Surgeon: Thurmon Fair, MD;  Location: [facility] ENDOSCOPY;  Service: Cardiovascular;  Laterality: N/A;  loop recorder to follow   . Loop recorder implant N/A 10/18/2014    Procedure: LOOP RECORDER IMPLANT;  Surgeon: Duke Salvia, MD;  Location: [facility] CATH LAB;  Service: Cardiovascular;  Laterality: N/A;    Family History  Problem Relation Age of Onset  . Atrial fibrillation Mother   . Hypertension Mother   . Lung cancer Father   . Kidney cancer Father     Social History   Social History  . Marital Status: Married    Spouse Name: Bethann Berkshire  . Number of Children: 2  . Years of Education: HS   Occupational History  .  Other    Self-employed landscaper   Social History Main Topics  . Smoking status: Never Smoker   . Smokeless tobacco: Former Neurosurgeon    Types: Chew     Comment: quit around 2012  . Alcohol Use: Yes     Comment: Occasional   . Drug Use: No  . Sexual Activity: Yes   Other Topics Concern  . Not on file  Social History Narrative   Patient lives at home with spouse.   Caffeine Use: 10 cups daily    Allergies  Allergen Reactions  . Penicillins     unknown     Review of Systems  Constitutional: Negative for fever, chills, weight loss, malaise/fatigue and fatigue.  HENT: Negative for ear  discharge, ear pain and sore throat.   Eyes: Negative for blurred vision.  Respiratory: Negative for cough, sputum production, shortness of breath and wheezing.   Cardiovascular: Negative for chest pain, palpitations and leg swelling.  Gastrointestinal: Negative for heartburn, nausea, abdominal pain, diarrhea, constipation, blood in stool and melena.  Genitourinary: Negative for dysuria, urgency, frequency and hematuria.  Musculoskeletal: Negative for myalgias, back pain, joint pain and neck pain.  Skin: Negative for rash.  Neurological: Negative for dizziness, tingling, sensory change, focal weakness, weakness, numbness and headaches.  Endo/Heme/Allergies: Negative for environmental allergies, polydipsia and polyphagia. Does not bruise/bleed easily.  Psychiatric/Behavioral: Negative for depression and suicidal ideas. The patient is not nervous/anxious and does not have insomnia.      Objective  Filed Vitals:   12/11/15 0803  BP: 120/70  Pulse: 78  Height:  (1.778 m)  Weight: 168 lb (76.204 kg)    Physical Exam  Constitutional: He is oriented to person, place, and time and well-developed, well-nourished, and in no distress.  HENT:  Head: Normocephalic.  Right Ear: External ear normal.  Left Ear: External ear normal.  Nose: Nose normal.  Mouth/Throat: Oropharynx is clear and moist.  Eyes: Conjunctivae and EOM are normal. Pupils are equal, round, and reactive to light. Right eye exhibits no discharge. Left eye exhibits no discharge. No scleral icterus.  Neck: Normal range of motion. Neck supple. No JVD present. No tracheal deviation present. No thyromegaly present.  Cardiovascular: Normal rate, regular rhythm, normal heart sounds and intact distal pulses.  Exam reveals no gallop and no friction rub.   No murmur heard. Pulmonary/Chest: Breath sounds normal. No respiratory distress. He has no wheezes. He has no rales.  Abdominal: Soft. Bowel sounds are normal. He exhibits no  mass. There is no hepatosplenomegaly. There is no tenderness. There is no rebound, no guarding and no CVA tenderness.  Genitourinary: Rectum normal and prostate normal. Guaiac negative stool. Prostate is not tender. No discharge found.  Prostate mild enlarged  Musculoskeletal: Normal range of motion. He exhibits no edema or tenderness.  Lymphadenopathy:    He has no cervical adenopathy.  Neurological: He is alert and oriented to person, place, and time. He has normal sensation, normal strength, normal reflexes and intact cranial nerves. No cranial nerve deficit.  Skin: Skin is warm. No rash noted.  Psychiatric: Mood and affect normal.  Nursing note and vitals reviewed.     Assessment & Plan  Problem List Items Addressed This Visit      Cardiovascular and Mediastinum   Stroke [facility])   Relevant Medications   atorvastatin (LIPITOR) 80 MG tablet   metFORMIN (GLUCOPHAGE) 500 MG tablet     Other   Dyslipidemia, goal LDL below 70   Relevant Medications   atorvastatin (LIPITOR) 80 MG tablet   Other Relevant Orders   Lipid Profile   HLD (hyperlipidemia)   Relevant Medications   atorvastatin (LIPITOR) 80 MG tablet    Other Visit Diagnoses    Type 2 diabetes mellitus with complication, with long-term current use of insulin (HCC)    -  Primary    Relevant Medications    atorvastatin (LIPITOR) 80 MG  tablet    Insulin Glargine (LANTUS SOLOSTAR) 100 UNIT/ML Solostar Pen    metFORMIN (GLUCOPHAGE) 500 MG tablet    Other Relevant Orders    HgB A1c    Renal Function Panel    Urine Microalbumin w/creat. ratio    Cerebral vascular disease        Relevant Medications    atorvastatin (LIPITOR) 80 MG tablet    clopidogrel (PLAVIX) 75 MG tablet    BPH (benign prostatic hypertrophy)        Relevant Orders    PSA    Type 1 diabetes mellitus without complication (HCC)        Relevant Medications    atorvastatin (LIPITOR) 80 MG tablet    Insulin Glargine (LANTUS SOLOSTAR) 100 UNIT/ML Solostar  Pen    metFORMIN (GLUCOPHAGE) 500 MG tablet    Colon cancer screening        Relevant Orders    POCT Occult Blood Stool (Completed)         Dr. Hayden [facility]  12/11/2015

## 2015-12-12 ENCOUNTER — Ambulatory Visit (INDEPENDENT_AMBULATORY_CARE_PROVIDER_SITE_OTHER): Payer: BLUE CROSS/BLUE SHIELD | Admitting: *Deleted

## 2015-12-12 DIAGNOSIS — I638 Other cerebral infarction: Secondary | ICD-10-CM | POA: Diagnosis not present

## 2015-12-12 DIAGNOSIS — I6389 Other cerebral infarction: Secondary | ICD-10-CM

## 2015-12-12 LAB — LIPID PANEL
CHOLESTEROL TOTAL: 129 mg/dL (ref 100–199)
Chol/HDL Ratio: 1.8 ratio units (ref 0.0–5.0)
HDL: 71 mg/dL (ref >39)
LDL Calculated: 50 mg/dL (ref 0–99)
TRIGLYCERIDES: 39 mg/dL (ref 0–149)
VLDL CHOLESTEROL CAL: 8 mg/dL (ref 5–40)

## 2015-12-12 LAB — PSA: Prostate Specific Ag, Serum: 0.3 ng/mL (ref 0.0–4.0)

## 2015-12-12 LAB — RENAL FUNCTION PANEL
ALBUMIN: 4.2 g/dL (ref 3.5–5.5)
BUN/Creatinine Ratio: 17 (ref 9–20)
BUN: 16 mg/dL (ref 6–24)
CO2: 25 mmol/L (ref 18–29)
CREATININE: 0.93 mg/dL (ref 0.76–1.27)
Calcium: 9.1 mg/dL (ref 8.7–10.2)
Chloride: 100 mmol/L (ref 96–106)
GFR, EST AFRICAN AMERICAN: 104 mL/min/{1.73_m2} (ref >59)
GFR, EST NON AFRICAN AMERICAN: 90 mL/min/{1.73_m2} (ref >59)
GLUCOSE: 208 mg/dL — AB (ref 65–99)
PHOSPHORUS: 3 mg/dL (ref 2.5–4.5)
POTASSIUM: 5.1 mmol/L (ref 3.5–5.2)
Sodium: 138 mmol/L (ref 134–144)

## 2015-12-12 LAB — HEMOGLOBIN A1C
ESTIMATED AVERAGE GLUCOSE: 255 mg/dL
HEMOGLOBIN A1C: 10.5 % — AB (ref 4.8–5.6)

## 2015-12-12 LAB — MICROALBUMIN / CREATININE URINE RATIO
Creatinine, Urine: 65.2 mg/dL
Microalbumin, Urine: <3 ug/mL

## 2015-12-12 NOTE — Addendum Note (Signed)
Addended by: Everitt Amber on: 12/12/2015 07:56 AM   Modules accepted: Orders

## 2015-12-12 NOTE — Progress Notes (Signed)
Carelink Summary Report / Loop Recorder 

## 2015-12-15 ENCOUNTER — Other Ambulatory Visit: Payer: Self-pay

## 2015-12-23 ENCOUNTER — Other Ambulatory Visit: Payer: Self-pay

## 2015-12-24 ENCOUNTER — Encounter: Payer: Self-pay | Admitting: Internal Medicine

## 2015-12-24 ENCOUNTER — Ambulatory Visit (INDEPENDENT_AMBULATORY_CARE_PROVIDER_SITE_OTHER): Payer: BLUE CROSS/BLUE SHIELD | Admitting: Internal Medicine

## 2015-12-24 VITALS — BP 122/78 | HR 55 | Ht 70.0 in | Wt 168.8 lb

## 2015-12-24 DIAGNOSIS — I6389 Other cerebral infarction: Secondary | ICD-10-CM

## 2015-12-24 DIAGNOSIS — I638 Other cerebral infarction: Secondary | ICD-10-CM

## 2015-12-24 LAB — CUP PACEART INCLINIC DEVICE CHECK: MDC IDC SESS DTM: 20170201100932

## 2015-12-24 NOTE — Progress Notes (Signed)
      Patient Care Team: Duanne Limerick, MD as PCP - General (Family Medicine)   HPI  Maxwell Clark is a 60 y.o. male Seen in followup of ILR implanted 11/15 for Cryptogenic Stroke   Echo was normal.  He has hx of HTN and DM   He denies chest pain or shortness of breath.   His aphasia is getting better.  Past Medical History  Diagnosis Date  . Diabetes mellitus without complication (HCC)   . Stroke (HCC) 10/15/2014  . Hyperlipidemia     Past Surgical History  Procedure Laterality Date  . Tee without cardioversion N/A 10/18/2014    Procedure: TRANSESOPHAGEAL ECHOCARDIOGRAM (TEE);  Surgeon: Thurmon Fair, MD;  Location: [facility] ENDOSCOPY;  Service: Cardiovascular;  Laterality: N/A;  loop recorder to follow   . Loop recorder implant N/A 10/18/2014    Procedure: LOOP RECORDER IMPLANT;  Surgeon: Duke Salvia, MD;  Location: [facility] CATH LAB;  Service: Cardiovascular;  Laterality: N/A;    Current Outpatient Prescriptions  Medication Sig Dispense Refill  . atorvastatin (LIPITOR) 80 MG tablet Take 1 tablet (80 mg total) by mouth daily. 90 tablet 1  . clopidogrel (PLAVIX) 75 MG tablet Take 1 tablet (75 mg total) by mouth daily. 90 tablet 1  . Insulin Glargine (LANTUS SOLOSTAR) 100 UNIT/ML Solostar Pen INJECT 15 UNITS AT BEDTIME 5 pen 1  . metFORMIN (GLUCOPHAGE) 500 MG tablet Take 1 tablet (500 mg total) by mouth 2 (two) times daily. 180 tablet 1   No current facility-administered medications for this visit.    Allergies  Allergen Reactions  . Penicillins     unknown    Review of Systems negative except from HPI and PMH  Physical Exam BP 122/78 mmHg  Pulse 55  Ht  (1.778 m)  Wt 168 lb 12.8 oz (76.567 kg)  BMI 24.22 kg/m2 Well developed and well nourished in no acute distress HENT normal E scleral and icterus clear Neck Supple JVP flat; carotids brisk and full Clear to ausculation Device pocket well healed; without hematoma or erythema.  There is no tethering  Regular rate and rhythm, no murmurs gallops or rub Soft with active bowel sounds No clubbing cyanosis  edema Alert and oriented, grossly normal motor and sensory function Skin Warm and Dry  ECG: Sinus Rhythm  @ 55 17/09/43  axis78   Assessment and  Plan  Cryptogenic Stroke  Loop Recorder   No intercurrent atrial fibrillation or flutter  No bleedng

## 2015-12-24 NOTE — Patient Instructions (Signed)

## 2015-12-26 ENCOUNTER — Encounter: Payer: Self-pay | Admitting: Internal Medicine

## 2015-12-29 ENCOUNTER — Other Ambulatory Visit: Payer: BLUE CROSS/BLUE SHIELD

## 2015-12-29 DIAGNOSIS — Z789 Other specified health status: Secondary | ICD-10-CM

## 2016-01-01 LAB — NICOTINE/COTININE METABOLITES
Cotinine: NOT DETECTED ng/mL
NICOTINE: NOT DETECTED ng/mL

## 2016-01-02 ENCOUNTER — Other Ambulatory Visit: Payer: Self-pay | Admitting: Family Medicine

## 2016-01-12 ENCOUNTER — Ambulatory Visit (INDEPENDENT_AMBULATORY_CARE_PROVIDER_SITE_OTHER): Payer: BLUE CROSS/BLUE SHIELD | Admitting: *Deleted

## 2016-01-12 DIAGNOSIS — I6389 Other cerebral infarction: Secondary | ICD-10-CM

## 2016-01-12 DIAGNOSIS — I638 Other cerebral infarction: Secondary | ICD-10-CM

## 2016-01-12 NOTE — Progress Notes (Signed)
Carelink Summary Report / Loop Recorder 

## 2016-01-20 ENCOUNTER — Encounter: Payer: Self-pay | Admitting: Internal Medicine

## 2016-01-26 LAB — CUP PACEART REMOTE DEVICE CHECK: MDC IDC SESS DTM: 20170120183531

## 2016-01-26 NOTE — Progress Notes (Signed)
Carelink summary report received. Battery status OK. Normal device function. No new symptom episodes, tachy episodes, brady, or pause episodes. No new AF episodes. Monthly summary reports and ROV/PRN 

## 2016-01-27 ENCOUNTER — Other Ambulatory Visit: Payer: Self-pay | Admitting: Family Medicine

## 2016-02-10 ENCOUNTER — Ambulatory Visit (INDEPENDENT_AMBULATORY_CARE_PROVIDER_SITE_OTHER): Payer: BLUE CROSS/BLUE SHIELD | Admitting: *Deleted

## 2016-02-10 DIAGNOSIS — I638 Other cerebral infarction: Secondary | ICD-10-CM | POA: Diagnosis not present

## 2016-02-10 DIAGNOSIS — I6389 Other cerebral infarction: Secondary | ICD-10-CM

## 2016-02-11 NOTE — Progress Notes (Signed)
Carelink Summary Report / Loop Recorder 

## 2016-02-21 ENCOUNTER — Other Ambulatory Visit: Payer: Self-pay | Admitting: Family Medicine

## 2016-02-26 ENCOUNTER — Encounter: Payer: Self-pay | Admitting: Internal Medicine

## 2016-03-11 ENCOUNTER — Ambulatory Visit (INDEPENDENT_AMBULATORY_CARE_PROVIDER_SITE_OTHER): Payer: BLUE CROSS/BLUE SHIELD | Admitting: *Deleted

## 2016-03-11 DIAGNOSIS — I638 Other cerebral infarction: Secondary | ICD-10-CM

## 2016-03-11 DIAGNOSIS — I6389 Other cerebral infarction: Secondary | ICD-10-CM

## 2016-03-12 NOTE — Progress Notes (Signed)
Carelink Summary Report / Loop Recorder 

## 2016-03-26 DIAGNOSIS — E1165 Type 2 diabetes mellitus with hyperglycemia: Secondary | ICD-10-CM | POA: Diagnosis not present

## 2016-03-26 DIAGNOSIS — Z794 Long term (current) use of insulin: Secondary | ICD-10-CM | POA: Diagnosis not present

## 2016-03-26 LAB — CUP PACEART REMOTE DEVICE CHECK: MDC IDC SESS DTM: 20170219183837

## 2016-04-02 DIAGNOSIS — E1165 Type 2 diabetes mellitus with hyperglycemia: Secondary | ICD-10-CM | POA: Diagnosis not present

## 2016-04-02 DIAGNOSIS — Z79899 Other long term (current) drug therapy: Secondary | ICD-10-CM | POA: Diagnosis not present

## 2016-04-02 DIAGNOSIS — Z794 Long term (current) use of insulin: Secondary | ICD-10-CM | POA: Diagnosis not present

## 2016-04-02 DIAGNOSIS — E162 Hypoglycemia, unspecified: Secondary | ICD-10-CM | POA: Diagnosis not present

## 2016-04-12 ENCOUNTER — Ambulatory Visit (INDEPENDENT_AMBULATORY_CARE_PROVIDER_SITE_OTHER): Payer: BLUE CROSS/BLUE SHIELD | Admitting: *Deleted

## 2016-04-12 DIAGNOSIS — I6389 Other cerebral infarction: Secondary | ICD-10-CM

## 2016-04-12 DIAGNOSIS — I638 Other cerebral infarction: Secondary | ICD-10-CM

## 2016-04-12 NOTE — Progress Notes (Signed)
Carelink Summary Report / Loop Recorder 

## 2016-04-16 DIAGNOSIS — S0502XA Injury of conjunctiva and corneal abrasion without foreign body, left eye, initial encounter: Secondary | ICD-10-CM | POA: Diagnosis not present

## 2016-04-16 IMAGING — CR DG CHEST 1V PORT
1 series · 1 of 1 positions shown · non-contrast
Comparison: None.

CLINICAL DATA: Stroke, nonverbal.

EXAM:
PORTABLE CHEST - 1 VIEW

[AP]
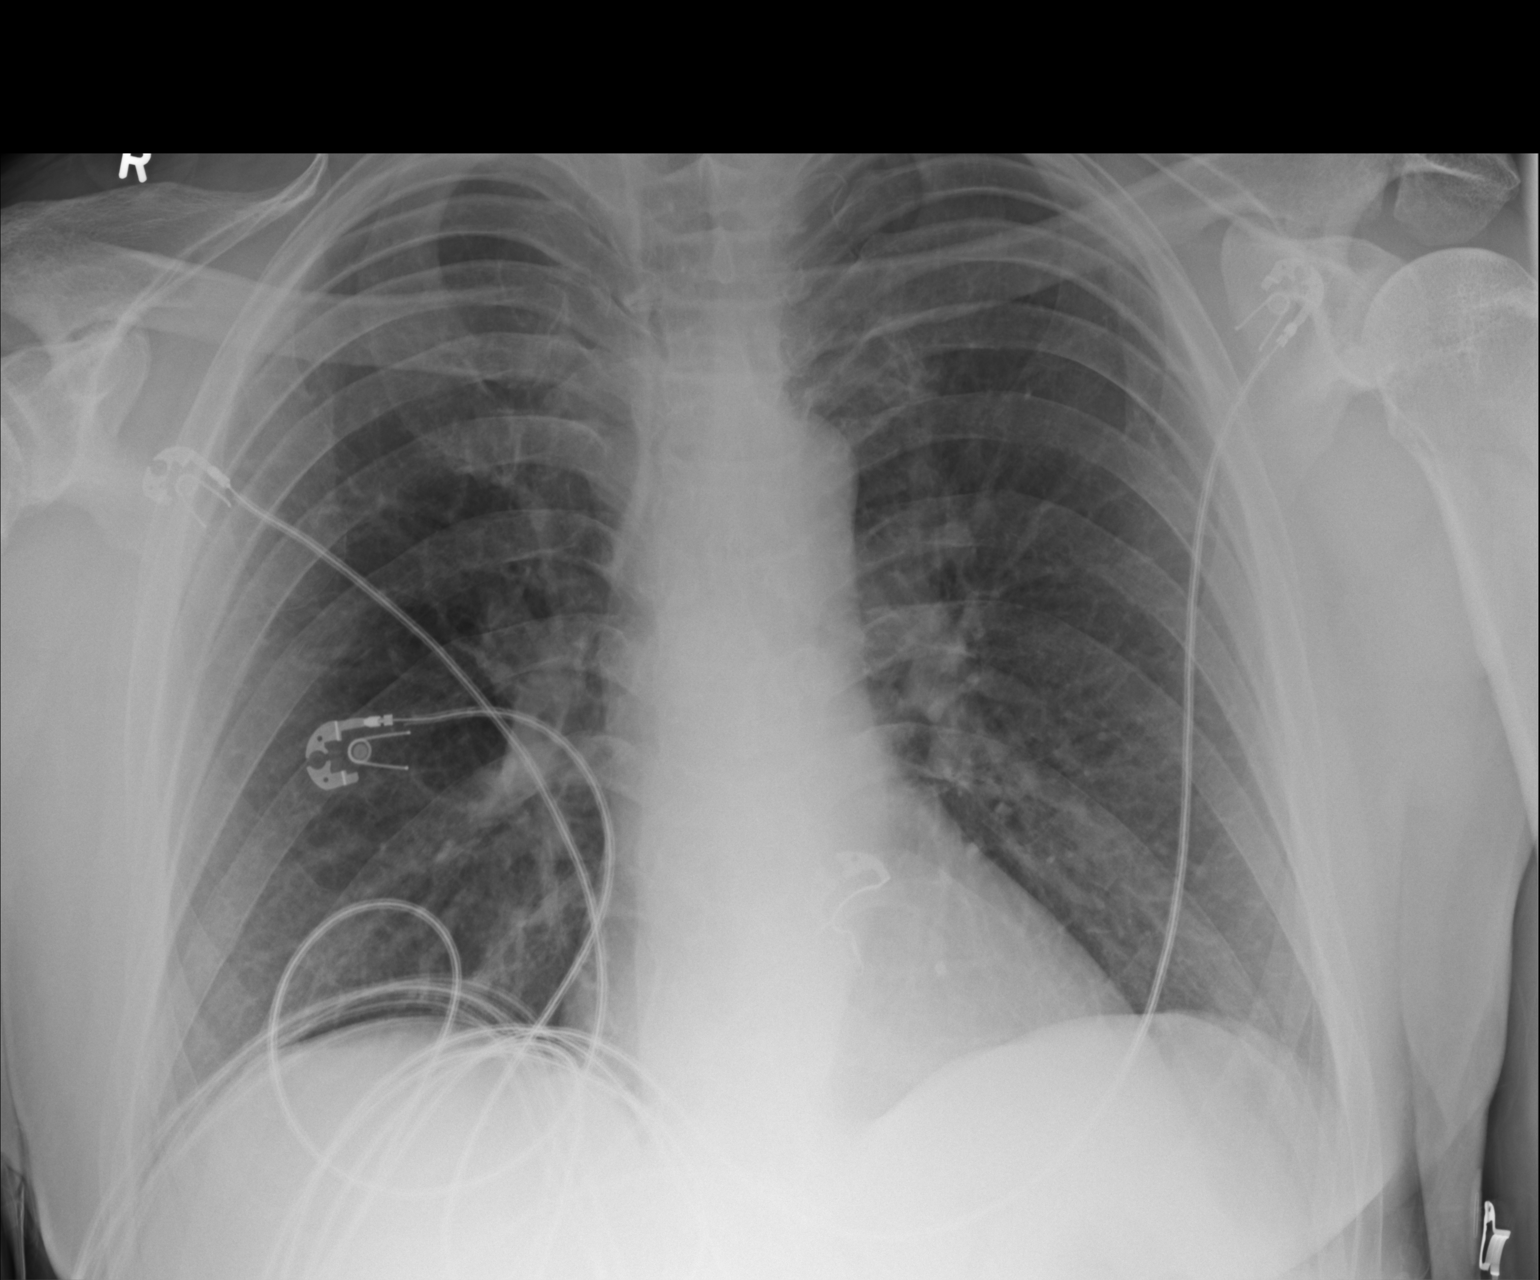

[1 of 1 positions shown; findings below may reference images not displayed]

FINDINGS: The heart size and mediastinal contours are within normal limits.
Both lungs are clear. The visualized skeletal structures are
unremarkable.
IMPRESSION: No active disease.

  By: Achmad Zaini Zain Mustari

## 2016-04-17 LAB — CUP PACEART REMOTE DEVICE CHECK: Date Time Interrogation Session: 20170321184011

## 2016-04-17 NOTE — Progress Notes (Signed)
Carelink summary report received. Battery status OK. Normal device function. No new symptom episodes, tachy episodes, brady, or pause episodes. AF episodes are SR with PAC's. Monthly summary reports and ROV/PRN 

## 2016-04-19 LAB — CUP PACEART REMOTE DEVICE CHECK: Date Time Interrogation Session: 20170420191018

## 2016-04-19 NOTE — Progress Notes (Signed)
Carelink summary report received. Battery status OK. Normal device function. No new symptom episodes, tachy episodes, brady, or pause episodes. 5 AF episodes, undersensing. Monthly summary reports and ROV/PRN

## 2016-05-10 ENCOUNTER — Ambulatory Visit (INDEPENDENT_AMBULATORY_CARE_PROVIDER_SITE_OTHER): Payer: BLUE CROSS/BLUE SHIELD | Admitting: *Deleted

## 2016-05-10 DIAGNOSIS — I638 Other cerebral infarction: Secondary | ICD-10-CM

## 2016-05-10 DIAGNOSIS — I6389 Other cerebral infarction: Secondary | ICD-10-CM

## 2016-05-10 NOTE — Progress Notes (Signed)
Carelink Summary Report / Loop Recorder 

## 2016-05-22 LAB — CUP PACEART REMOTE DEVICE CHECK
Date Time Interrogation Session: 20170520191002
Date Time Interrogation Session: 20170619193722

## 2016-05-23 ENCOUNTER — Other Ambulatory Visit: Payer: Self-pay | Admitting: Family Medicine

## 2016-06-09 ENCOUNTER — Ambulatory Visit (INDEPENDENT_AMBULATORY_CARE_PROVIDER_SITE_OTHER): Payer: BLUE CROSS/BLUE SHIELD | Admitting: *Deleted

## 2016-06-09 ENCOUNTER — Other Ambulatory Visit: Payer: Self-pay | Admitting: Family Medicine

## 2016-06-09 DIAGNOSIS — I6389 Other cerebral infarction: Secondary | ICD-10-CM

## 2016-06-09 DIAGNOSIS — I638 Other cerebral infarction: Secondary | ICD-10-CM | POA: Diagnosis not present

## 2016-06-10 NOTE — Progress Notes (Signed)
Carelink Summary Report / Loop Recorder 

## 2016-06-18 DIAGNOSIS — E1165 Type 2 diabetes mellitus with hyperglycemia: Secondary | ICD-10-CM | POA: Diagnosis not present

## 2016-06-18 DIAGNOSIS — Z794 Long term (current) use of insulin: Secondary | ICD-10-CM | POA: Diagnosis not present

## 2016-07-01 LAB — CUP PACEART REMOTE DEVICE CHECK: Date Time Interrogation Session: 20170719200706

## 2016-07-09 ENCOUNTER — Ambulatory Visit (INDEPENDENT_AMBULATORY_CARE_PROVIDER_SITE_OTHER): Payer: BLUE CROSS/BLUE SHIELD | Admitting: *Deleted

## 2016-07-09 DIAGNOSIS — I6389 Other cerebral infarction: Secondary | ICD-10-CM

## 2016-07-09 DIAGNOSIS — I638 Other cerebral infarction: Secondary | ICD-10-CM

## 2016-07-12 NOTE — Progress Notes (Signed)
Carelink Summary Report / Loop Report 

## 2016-07-25 ENCOUNTER — Other Ambulatory Visit: Payer: Self-pay | Admitting: Family Medicine

## 2016-07-27 ENCOUNTER — Encounter: Payer: Self-pay | Admitting: Internal Medicine

## 2016-07-28 ENCOUNTER — Other Ambulatory Visit: Payer: Self-pay | Admitting: Family Medicine

## 2016-07-28 ENCOUNTER — Other Ambulatory Visit: Payer: Self-pay | Admitting: Neurology

## 2016-07-28 DIAGNOSIS — I679 Cerebrovascular disease, unspecified: Secondary | ICD-10-CM

## 2016-08-03 ENCOUNTER — Telehealth: Payer: Self-pay | Admitting: Neurology

## 2016-08-03 ENCOUNTER — Other Ambulatory Visit: Payer: Self-pay

## 2016-08-03 DIAGNOSIS — I679 Cerebrovascular disease, unspecified: Secondary | ICD-10-CM

## 2016-08-03 MED ORDER — CLOPIDOGREL BISULFATE 75 MG PO TABS: 75.0000 mg | ORAL_TABLET | Freq: Every day | ORAL | 0 refills | Status: DC

## 2016-08-03 NOTE — Telephone Encounter (Signed)
Rn call patients wife Nicki Guadalajararicia about plavix refill. RN stated pts PCP has been refilling all of her husbands medications. Pt was last here 02/2015 and never follow up per Dr. Roda ShuttersXu note. Pt was suppose to schedule a 3 month follow up and never did. Rn ask if patient was in the hospital again for a stroke. Pts wife stated he was in the hospital 2015. He has had not hospital visits recently or this year. Rn advise patients wife to contact PCP for his Plavix for a refill because its anticoagulant. The appt was made today by phone staff. No refill request was on the list. Pts PCP was the prescribed since 11/2015.

## 2016-08-03 NOTE — Telephone Encounter (Signed)
Pt's wife called to get refill on Clopidogrel Bisulfate 75 MG TAKE 1 TABLET DAILY  He has made an appt for October 20th. However, he only has 3 pills left. Can a one month supply be sent to [id number] [place] - [address] Then [facility]  May call [phone], Nicki Guadalajararicia

## 2016-08-04 LAB — CUP PACEART REMOTE DEVICE CHECK: MDC IDC SESS DTM: 20170818203555

## 2016-08-09 ENCOUNTER — Ambulatory Visit (INDEPENDENT_AMBULATORY_CARE_PROVIDER_SITE_OTHER): Payer: BLUE CROSS/BLUE SHIELD | Admitting: *Deleted

## 2016-08-09 DIAGNOSIS — I6389 Other cerebral infarction: Secondary | ICD-10-CM

## 2016-08-09 DIAGNOSIS — I638 Other cerebral infarction: Secondary | ICD-10-CM

## 2016-08-09 NOTE — Progress Notes (Signed)
Carelink Summary Report / Loop Recorder 

## 2016-09-03 LAB — CUP PACEART REMOTE DEVICE CHECK: MDC IDC SESS DTM: 20170917211036

## 2016-09-07 ENCOUNTER — Ambulatory Visit (INDEPENDENT_AMBULATORY_CARE_PROVIDER_SITE_OTHER): Payer: BLUE CROSS/BLUE SHIELD | Admitting: *Deleted

## 2016-09-07 DIAGNOSIS — I638 Other cerebral infarction: Secondary | ICD-10-CM

## 2016-09-07 DIAGNOSIS — I6389 Other cerebral infarction: Secondary | ICD-10-CM

## 2016-09-08 NOTE — Progress Notes (Signed)
Carelink Summary Report / Loop Recorder 

## 2016-09-10 ENCOUNTER — Ambulatory Visit (INDEPENDENT_AMBULATORY_CARE_PROVIDER_SITE_OTHER): Payer: BLUE CROSS/BLUE SHIELD | Admitting: Neurology

## 2016-09-10 ENCOUNTER — Encounter: Payer: Self-pay | Admitting: Neurology

## 2016-09-10 VITALS — BP 113/66 | HR 80 | Ht 70.0 in | Wt 161.0 lb

## 2016-09-10 DIAGNOSIS — I639 Cerebral infarction, unspecified: Secondary | ICD-10-CM

## 2016-09-10 DIAGNOSIS — I63412 Cerebral infarction due to embolism of left middle cerebral artery: Secondary | ICD-10-CM

## 2016-09-10 DIAGNOSIS — E1159 Type 2 diabetes mellitus with other circulatory complications: Secondary | ICD-10-CM | POA: Diagnosis not present

## 2016-09-10 DIAGNOSIS — R4701 Aphasia: Secondary | ICD-10-CM | POA: Diagnosis not present

## 2016-09-10 DIAGNOSIS — E785 Hyperlipidemia, unspecified: Secondary | ICD-10-CM

## 2016-09-10 DIAGNOSIS — IMO0002 Reserved for concepts with insufficient information to code with codable children: Secondary | ICD-10-CM

## 2016-09-10 NOTE — Patient Instructions (Signed)
-   continue plavix and lipitor for stroke prevention - Follow up with your primary care physician for stroke risk factor modification. Recommend maintain blood pressure goal <130/80, diabetes with hemoglobin A1c goal below 6.5% and lipids with LDL cholesterol goal below 70 mg/dL.  - continue home exercise and speech exercise. - check BP and glucose at home - follow up as needed.

## 2016-09-10 NOTE — Progress Notes (Signed)
STROKE NEUROLOGY FOLLOW UP NOTE  NAME: Maxwell Clark DOB: 06/04/1956  REASON FOR VISIT: stroke follow up HISTORY FROM: pt and chart  Today we had the pleasure of seeing Maxwell Clark in follow-up at our Neurology Clinic. Pt was accompanied by no one.   History Summary Maxwell Clark is an 60 y.o. male history of diabetes was admitted on 10/15/14 for expressive aphasia. CT scan of his mute his head showed an area of likely evolving stroke involving the left frontal region. Patient had no difficulty with understanding what was being said to him and following commands, but was mute. Patient was past the time window for treatment with TPA. NIH stroke scale was 9. MRI showed left MCA broca's area stroke. And stroke work up negative including CUS, MRA, TEE, hypercoagulable labs except A1C 8.1 and LDL 115. He was put on ASA 81 and lipitor 80 as well as loop recorder. He was discharged in good condition.   02/26/15 follow up - the patient has been doing better. He followed up with speech therapist initially 3 times a week and now twice a week. His speech much improved and now only has mild expressive aphasia with dysarthria. He has no arm or leg weakness, but still right facial droop. His glucose in better control, insulin done from 25 units to 13 units now. His BP today 130/71.   Interval History During the interval time, pt has been doing well. No recurrent symptoms. Still has mild expressive aphasia. Loop recorder so far no afib. Still on plavix and lipitor for stroke prevention. BP and glucose controlled well. BP 113/60.   REVIEW OF SYSTEMS: Full 14 system review of systems performed and notable only for those listed below and in HPI above, all others are negative:  Constitutional:   Cardiovascular:  Ear/Nose/Throat:   Skin:  Eyes:   Respiratory:   Gastroitestinal:   Genitourinary:  Hematology/Lymphatic:   Endocrine:  Musculoskeletal:   Allergy/Immunology:   Neurological:  Slurry  speech Psychiatric:  Sleep:   The following represents the patient's updated allergies and side effects list: Allergies  Allergen Reactions  . Penicillins     unknown    The neurologically relevant items on the patient's problem list were reviewed on today's visit.  Neurologic Examination  A problem focused neurological exam (12 or more points of the single system neurologic examination, vital signs counts as 1 point, cranial nerves count for 8 points) was performed.  Blood pressure 113/66, pulse 80, height 5\' 10"  (1.778 m), weight 161 lb (73 kg).  General - Well nourished, well developed, in no apparent distress.  Ophthalmologic - Sharp disc margins OU.  Cardiovascular - Regular rate and rhythm with no murmur.  Mental Status -  Level of arousal and orientation to time, place, and person were intact. Language including naming, repetition, comprehension was assessed and found intact. However, he still has mild expressive aphasia and significant dysarthria. Attention span and concentration were normal. Recent and remote memory were intact. Fund of Knowledge was assessed and was intact.  Cranial Nerves II - XII - II - Visual field intact OU. III, IV, VI - Extraocular movements intact. V - Facial sensation intact bilaterally. VII - right facial droop. VIII - Hearing & vestibular intact bilaterally. X - Palate elevates symmetrically. XI - Chin turning & shoulder shrug intact bilaterally. XII - Tongue protrusion intact.  Motor Strength - The patient's strength was normal in all extremities and pronator drift was absent.  Bulk was  normal and fasciculations were absent.   Motor Tone - Muscle tone was assessed at the neck and appendages and was normal.  Reflexes - The patient's reflexes were normal in all extremities and he had no pathological reflexes.  Sensory - Light touch, temperature/pinprick, vibration and proprioception, and Romberg testing were assessed and were normal.     Coordination - The patient had normal movements in the hands and feet with no ataxia or dysmetria.  Tremor was absent.  Gait and Station - The patient's transfers, posture, gait, station, and turns were observed as normal.  Data reviewed: I personally reviewed the images and agree with the radiology interpretations.  Carotid Doppler There is 1-39% bilateral ICA stenosis. Vertebral artery flow is antegrade.   Mri and Mra brain - Acute hemorrhagic infarct in the left frontal operculum and left insular cortex. No other significant ischemic change. Mild to moderate stenosis of the left MCA bifurcation extending into the temporal branch of the left MCA. This is less severe than would be expected for acute infarct and could represent some residual thrombus. Alternately, an embolus could be the cause of this infarction.  2D echo - Normal LV size and systolic function, EF 60-65%. Normal RV size and systolic function. No significant valvular abnormalities.  TEE - Left ventricle: Systolic function was normal. The estimated ejection fraction was in the range of 55% to 60%. Wall motion was normal; there were no regional wall motion abnormalities. - Left atrium: No evidence of thrombus in the atrial cavity or appendage. - Right atrium: No evidence of thrombus in the atrial cavity or appendage. - no intracardiac shunt  Loop recorder - no Afib so far.  Component     Latest Ref Rng 10/15/2014 10/16/2014  PTT Lupus Anticoagulant     28.0 - 43.0 secs  37.3  PTTLA Confirmation     <8.0 secs  NOT APPL  PTTLA 4:1 Mix     28.0 - 43.0 secs  NOT APPL  DRVVT     <42.9 secs  34.4  Drvvt confirmation     <1.15 Ratio  NOT APPL  dRVVT Incubated 1:1 Mix     <42.9 secs  NOT APPL  Lupus Anticoagulant     NOT DETECTED  NOT DETECTED  Cholesterol     0 - 200 mg/dL 409208 (H)   Triglycerides     <150 mg/dL 52   HDL     >81>39 mg/dL 83   Total CHOL/HDL Ratio      2.5   VLDL     0 - 40  mg/dL 10   LDL (calc)     0 - 99 mg/dL 191115 (H)   Beta-2 Glyco I IgG     <20 G Units  4  Beta-2-Glycoprotein I IgM     <20 M Units  5  Beta-2-Glycoprotein I IgA     <20 A Units  1  Interpretation-F5LEID:       REPORT  Recommendations-F5LEID:       REPORT  Reviewer       REPORT  Interpretation-PTGENE:       REPORT  Recommendations-PTGENE:       REPORT  Reviewer       REPORT  Anticardiolipin IgG     <23 GPL U/mL  17  Anticardiolipin IgM     <11 MPL U/mL  0 (L)  Anticardiolipin IgA     <22 APL U/mL  5 (L)  Hemoglobin A1C     <5.7 % 8.1 (  H)   Mean Plasma Glucose     <117 mg/dL 098 (H)   AntiThromb III Func     75 - 120 %  114  Protein C Activity     75 - 133 %  139 (H)  Protein C, Total     72 - 160 %  76  Protein S Activity     69 - 129 %  91  Protein S Ag, Total     60 - 150 %  86  Homocysteine     4.0 - 15.4 umol/L  10.3    Assessment: As you may recall, he is a 60 y.o. Caucasian male with PMH of DM was admitted on 10/15/14 for left MCA broca's area stroke, consistent with embolic pattern. However, stroke work up including TEE, CUS, TTE, hypercoagulable work up, loop recorder so far all negative except MRA showed left M2 origin mild stenosis, A1C 8.1 and LDL 115. He is on ASA 81 and lipitor 80. Will change to plavix and continue lipitor. Loop recorder so far negative for afib and TCD bubble study negative for PFO. He has been doing well since last visit, still has mild expressive aphasia. Recommended to repeat CTA or MRA and TCD MES but he declined at this time. His glucose and BP under control. He does have stroke risk factors but his stroke more like cryptogenic stroke  Plan:  - continue plavix and lipitor for stroke prevention - Follow up with your primary care physician for stroke risk factor modification. Recommend maintain blood pressure goal <130/80, diabetes with hemoglobin A1c goal below 6.5% and lipids with LDL cholesterol goal below 70 mg/dL.  - continue  home exercise and speech exercise. - check BP and glucose at home - follow up as needed.  No orders of the defined types were placed in this encounter.   Meds ordered this encounter  Medications  . Insulin Glargine-Lixisenatide 100-33 UNT-MCG/ML SOPN    Sig: Inject 15 Units into the skin.     Patient Instructions  - continue plavix and lipitor for stroke prevention - Follow up with your primary care physician for stroke risk factor modification. Recommend maintain blood pressure goal <130/80, diabetes with hemoglobin A1c goal below 6.5% and lipids with LDL cholesterol goal below 70 mg/dL.  - continue home exercise and speech exercise. - check BP and glucose at home - follow up as needed.   Marvel Plan, MD PhD [facility] [address] [place], [place] [address] [phone]

## 2016-09-13 ENCOUNTER — Other Ambulatory Visit: Payer: Self-pay | Admitting: Family Medicine

## 2016-09-17 ENCOUNTER — Ambulatory Visit (INDEPENDENT_AMBULATORY_CARE_PROVIDER_SITE_OTHER): Payer: BLUE CROSS/BLUE SHIELD | Admitting: Family Medicine

## 2016-09-17 ENCOUNTER — Encounter: Payer: Self-pay | Admitting: Family Medicine

## 2016-09-17 VITALS — BP 120/70 | HR 80 | Ht 70.0 in | Wt 164.0 lb

## 2016-09-17 DIAGNOSIS — I679 Cerebrovascular disease, unspecified: Secondary | ICD-10-CM

## 2016-09-17 DIAGNOSIS — E785 Hyperlipidemia, unspecified: Secondary | ICD-10-CM

## 2016-09-17 MED ORDER — ATORVASTATIN CALCIUM 80 MG PO TABS: ORAL_TABLET | ORAL | 2 refills | Status: DC

## 2016-09-17 MED ORDER — CLOPIDOGREL BISULFATE 75 MG PO TABS: 75.0000 mg | ORAL_TABLET | Freq: Every day | ORAL | 2 refills | Status: DC

## 2016-09-17 NOTE — Progress Notes (Signed)
Name: Maxwell Clark   MRN: [id number]    DOB: 1956-01-19   Date:09/17/2016       Progress Note  Subjective  Chief Complaint  Chief Complaint  Patient presents with  . Hyperlipidemia  . Cerebrovascular Accident    taking Plavix daily    Hyperlipidemia  This is a chronic problem. The current episode started more than 1 year ago. The problem is controlled. Recent lipid tests were reviewed and are normal. He has no history of chronic renal disease, diabetes, hypothyroidism, liver disease, obesity or nephrotic syndrome. There are no known factors aggravating his hyperlipidemia. Pertinent negatives include no chest pain, focal sensory loss, focal weakness, leg pain, myalgias or shortness of breath. He is currently on no antihyperlipidemic treatment. The current treatment provides mild improvement of lipids. There are no compliance problems.  There are no known risk factors for coronary artery disease.  Cerebrovascular Accident  This is a chronic problem. The current episode started more than 1 year ago. The problem occurs intermittently. The problem has been waxing and waning. Pertinent negatives include no abdominal pain, chest pain, chills, coughing, fatigue, fever, headaches, myalgias, nausea, neck pain, rash or sore throat. Nothing aggravates the symptoms. The treatment provided mild relief.    No problem-specific Assessment & Plan notes found for this encounter.   Past Medical History:  Diagnosis Date  . Diabetes mellitus without complication (HCC)   . Hyperlipidemia   . Stroke [facility](HCC) 10/15/2014    Past Surgical History:  Procedure Laterality Date  . LOOP RECORDER IMPLANT N/A 10/18/2014   Procedure: LOOP RECORDER IMPLANT;  Surgeon: Duke SalviaSteven C Klein, MD;  [facility];  Service: Cardiovascular;  Laterality: N/A;  . TEE WITHOUT CARDIOVERSION N/A 10/18/2014   Procedure: TRANSESOPHAGEAL ECHOCARDIOGRAM (TEE);  Surgeon: Thurmon FairMihai Croitoru, MD;  [facility];  Service:  Cardiovascular;  Laterality: N/A;  loop recorder to follow     Family History  Problem Relation Age of Onset  . Atrial fibrillation Mother   . Hypertension Mother   . Lung cancer Father   . Kidney cancer Father     Social History   Social History  . Marital status: Married    Spouse name: Bethann Berkshirerisha  . Number of children: 2  . Years of education: HS   Occupational History  .  Other    Self-employed landscaper   Social History Main Topics  . Smoking status: Never Smoker  . Smokeless tobacco: Former NeurosurgeonUser    Types: Chew     Comment: quit around 2012  . Alcohol use 0.6 oz/week    1 Cans of beer per week     Comment: Occasional   . Drug use: No  . Sexual activity: Yes   Other Topics Concern  . Not on file   Social History Narrative   Patient lives at home with spouse.   Caffeine Use: 10 cups daily    Allergies  Allergen Reactions  . Penicillins     unknown     Review of Systems  Constitutional: Negative for chills, fatigue, fever, malaise/fatigue and weight loss.  HENT: Negative for ear discharge, ear pain and sore throat.   Eyes: Negative for blurred vision.  Respiratory: Negative for cough, sputum production, shortness of breath and wheezing.   Cardiovascular: Negative for chest pain, palpitations and leg swelling.  Gastrointestinal: Negative for abdominal pain, blood in stool, constipation, diarrhea, heartburn, melena and nausea.  Genitourinary: Negative for dysuria, frequency, hematuria and urgency.  Musculoskeletal: Negative for back  pain, joint pain, myalgias and neck pain.  Skin: Negative for rash.  Neurological: Negative for dizziness, tingling, sensory change, focal weakness and headaches.  Endo/Heme/Allergies: Negative for environmental allergies and polydipsia. Does not bruise/bleed easily.  Psychiatric/Behavioral: Negative for depression and suicidal ideas. The patient is not nervous/anxious and does not have insomnia.      Objective  Vitals:    09/17/16 0925  BP: 120/70  Pulse: 80  Weight: 164 lb (74.4 kg)  Height: 5\' 10"  (1.778 m)    Physical Exam  Constitutional: He is oriented to person, place, and time and well-developed, well-nourished, and in no distress.  HENT:  Head: Normocephalic.  Right Ear: External ear normal.  Left Ear: External ear normal.  Nose: Nose normal.  Mouth/Throat: Oropharynx is clear and moist.  Eyes: Conjunctivae and EOM are normal. Pupils are equal, round, and reactive to light. Right eye exhibits no discharge. Left eye exhibits no discharge. No scleral icterus.  Neck: Normal range of motion. Neck supple. No JVD present. No tracheal deviation present. No thyromegaly present.  Cardiovascular: Normal rate, regular rhythm, normal heart sounds and intact distal pulses.  Exam reveals no gallop and no friction rub.   No murmur heard. Pulmonary/Chest: Breath sounds normal. No respiratory distress. He has no wheezes. He has no rales.  Abdominal: Soft. Bowel sounds are normal. He exhibits no mass. There is no hepatosplenomegaly. There is no tenderness. There is no rebound, no guarding and no CVA tenderness.  Musculoskeletal: Normal range of motion. He exhibits no edema or tenderness.  Lymphadenopathy:    He has no cervical adenopathy.  Neurological: He is alert and oriented to person, place, and time. He has normal sensation, normal strength, normal reflexes and intact cranial nerves. No cranial nerve deficit.  Skin: Skin is warm. No rash noted.  Psychiatric: Mood and affect normal.  Nursing note and vitals reviewed.     Assessment & Plan  Problem List Items Addressed This Visit      Cardiovascular and Mediastinum   Cerebral vascular disease - Primary   Relevant Medications   atorvastatin (LIPITOR) 80 MG tablet   clopidogrel (PLAVIX) 75 MG tablet     Other   Dyslipidemia, goal LDL below 70   Relevant Medications   atorvastatin (LIPITOR) 80 MG tablet   clopidogrel (PLAVIX) 75 MG tablet   Other  Relevant Orders   Lipid Profile    Other Visit Diagnoses   None.       Dr. Hayden [facility]  09/17/16

## 2016-09-18 LAB — LIPID PANEL
CHOLESTEROL TOTAL: 124 mg/dL (ref 100–199)
Chol/HDL Ratio: 1.9 ratio units (ref 0.0–5.0)
HDL: 65 mg/dL (ref >39)
LDL Calculated: 49 mg/dL (ref 0–99)
Triglycerides: 50 mg/dL (ref 0–149)
VLDL CHOLESTEROL CAL: 10 mg/dL (ref 5–40)

## 2016-09-21 ENCOUNTER — Other Ambulatory Visit: Payer: Self-pay

## 2016-09-21 DIAGNOSIS — E785 Hyperlipidemia, unspecified: Secondary | ICD-10-CM

## 2016-09-21 DIAGNOSIS — I679 Cerebrovascular disease, unspecified: Secondary | ICD-10-CM

## 2016-09-21 MED ORDER — CLOPIDOGREL BISULFATE 75 MG PO TABS: 75.0000 mg | ORAL_TABLET | Freq: Every day | ORAL | 0 refills | Status: DC

## 2016-09-23 ENCOUNTER — Other Ambulatory Visit: Payer: Self-pay | Admitting: Family Medicine

## 2016-09-23 DIAGNOSIS — E1165 Type 2 diabetes mellitus with hyperglycemia: Secondary | ICD-10-CM | POA: Diagnosis not present

## 2016-09-23 DIAGNOSIS — Z79899 Other long term (current) drug therapy: Secondary | ICD-10-CM | POA: Diagnosis not present

## 2016-09-23 DIAGNOSIS — I679 Cerebrovascular disease, unspecified: Secondary | ICD-10-CM

## 2016-09-23 DIAGNOSIS — Z794 Long term (current) use of insulin: Secondary | ICD-10-CM | POA: Diagnosis not present

## 2016-10-07 ENCOUNTER — Ambulatory Visit (INDEPENDENT_AMBULATORY_CARE_PROVIDER_SITE_OTHER): Payer: BLUE CROSS/BLUE SHIELD | Admitting: *Deleted

## 2016-10-07 DIAGNOSIS — I638 Other cerebral infarction: Secondary | ICD-10-CM

## 2016-10-07 DIAGNOSIS — I6389 Other cerebral infarction: Secondary | ICD-10-CM

## 2016-10-08 NOTE — Progress Notes (Signed)
Carelink Summary Report / Loop Recorder 

## 2016-10-09 LAB — CUP PACEART REMOTE DEVICE CHECK
MDC IDC PG IMPLANT DT: 20151127
MDC IDC SESS DTM: 20171017233652

## 2016-10-09 NOTE — Progress Notes (Signed)
Carelink summary report received. Battery status OK. Normal device function. No new symptom episodes, tachy episodes, brady, or pause episodes. 1 AF episode, SR w PAC's. Monthly summary reports and ROV/PRN 

## 2016-11-05 ENCOUNTER — Ambulatory Visit (INDEPENDENT_AMBULATORY_CARE_PROVIDER_SITE_OTHER): Payer: BLUE CROSS/BLUE SHIELD | Admitting: *Deleted

## 2016-11-05 DIAGNOSIS — I638 Other cerebral infarction: Secondary | ICD-10-CM | POA: Diagnosis not present

## 2016-11-05 DIAGNOSIS — I6389 Other cerebral infarction: Secondary | ICD-10-CM

## 2016-11-08 NOTE — Progress Notes (Signed)
Carelink Summary Report / Loop Recorder 

## 2016-11-18 LAB — CUP PACEART REMOTE DEVICE CHECK
Implantable Pulse Generator Implant Date: 20151127
MDC IDC SESS DTM: 20171117003643

## 2016-11-26 ENCOUNTER — Other Ambulatory Visit: Payer: Self-pay

## 2016-12-06 ENCOUNTER — Ambulatory Visit (INDEPENDENT_AMBULATORY_CARE_PROVIDER_SITE_OTHER): Payer: BLUE CROSS/BLUE SHIELD | Admitting: *Deleted

## 2016-12-06 DIAGNOSIS — I638 Other cerebral infarction: Secondary | ICD-10-CM | POA: Diagnosis not present

## 2016-12-06 DIAGNOSIS — I6389 Other cerebral infarction: Secondary | ICD-10-CM

## 2016-12-07 NOTE — Progress Notes (Signed)
Carelink Summary Report 

## 2016-12-15 ENCOUNTER — Other Ambulatory Visit: Payer: Self-pay | Admitting: Internal Medicine

## 2016-12-21 DIAGNOSIS — E1165 Type 2 diabetes mellitus with hyperglycemia: Secondary | ICD-10-CM | POA: Diagnosis not present

## 2016-12-21 DIAGNOSIS — Z794 Long term (current) use of insulin: Secondary | ICD-10-CM | POA: Diagnosis not present

## 2016-12-26 LAB — CUP PACEART REMOTE DEVICE CHECK
Implantable Pulse Generator Implant Date: 20151127
MDC IDC SESS DTM: 20171217010806

## 2016-12-26 NOTE — Progress Notes (Signed)
Carelink summary report received. Battery status OK. Normal device function. No new symptom episodes, tachy episodes, brady, or pause episodes. 4 AF 0%- ECGs appear SR w/ vent. undersensing. Monthly summary reports and ROV/PRN

## 2016-12-28 DIAGNOSIS — E162 Hypoglycemia, unspecified: Secondary | ICD-10-CM | POA: Diagnosis not present

## 2016-12-28 DIAGNOSIS — Z79899 Other long term (current) drug therapy: Secondary | ICD-10-CM | POA: Diagnosis not present

## 2016-12-28 DIAGNOSIS — R946 Abnormal results of thyroid function studies: Secondary | ICD-10-CM | POA: Diagnosis not present

## 2016-12-28 DIAGNOSIS — E1165 Type 2 diabetes mellitus with hyperglycemia: Secondary | ICD-10-CM | POA: Diagnosis not present

## 2017-01-05 ENCOUNTER — Ambulatory Visit (INDEPENDENT_AMBULATORY_CARE_PROVIDER_SITE_OTHER): Payer: BLUE CROSS/BLUE SHIELD | Admitting: *Deleted

## 2017-01-05 DIAGNOSIS — I6389 Other cerebral infarction: Secondary | ICD-10-CM

## 2017-01-05 DIAGNOSIS — I638 Other cerebral infarction: Secondary | ICD-10-CM | POA: Diagnosis not present

## 2017-01-06 ENCOUNTER — Encounter: Payer: Self-pay | Admitting: Internal Medicine

## 2017-01-06 NOTE — Progress Notes (Signed)
Carelink Summary Report / Loop Recorder 

## 2017-01-07 ENCOUNTER — Other Ambulatory Visit: Payer: Self-pay | Admitting: Family Medicine

## 2017-01-14 LAB — CUP PACEART REMOTE DEVICE CHECK
Date Time Interrogation Session: 20180116011156
Implantable Pulse Generator Implant Date: 20151127
MDC IDC PG IMPLANT DT: 20151127
MDC IDC SESS DTM: 20180215011057

## 2017-01-17 ENCOUNTER — Encounter: Payer: Self-pay | Admitting: Internal Medicine

## 2017-01-17 ENCOUNTER — Ambulatory Visit (INDEPENDENT_AMBULATORY_CARE_PROVIDER_SITE_OTHER): Payer: BLUE CROSS/BLUE SHIELD | Admitting: Internal Medicine

## 2017-01-17 VITALS — BP 112/64 | HR 51 | Ht 70.0 in | Wt 169.2 lb

## 2017-01-17 DIAGNOSIS — I639 Cerebral infarction, unspecified: Secondary | ICD-10-CM

## 2017-01-17 LAB — CUP PACEART INCLINIC DEVICE CHECK
Implantable Pulse Generator Implant Date: 20151127
MDC IDC SESS DTM: 20180226153708

## 2017-01-17 NOTE — Patient Instructions (Signed)
Medication Instructions: Your physician recommends that you continue on your current medications as directed. Please refer to the Current Medication list given to you today.   Labwork: None Ordered  Procedures/Testing: None Ordered  Follow-Up: Your physician wants you to follow-up in December with Dr Graciela HusbandsKlein. You will receive a reminder letter in the mail two months in advance. If you don't receive a letter, please call our office to schedule the follow-up appointment.   Any Additional Special Instructions Will Be Listed Below (If Applicable).     If you need a refill on your cardiac medications before your next appointment, please call your pharmacy.

## 2017-01-17 NOTE — Progress Notes (Signed)
      Patient Care Team: Duanne Limerickeanna C Jones, MD as PCP - General (Family Medicine)   HPI  Maxwell OleaDavid G Derflinger is a 61 y.o. male Seen in followup of ILR implanted 11/15 for Cryptogenic Stroke   Echo was normal.  He has hx of HTN and DM   He denies chest pain or shortness of breath.     His aphasia is getting better.  He is working  Past Medical History:  Diagnosis Date  . Diabetes mellitus without complication (HCC)   . Hyperlipidemia   . Stroke [facility](HCC) 10/15/2014    Past Surgical History:  Procedure Laterality Date  . LOOP RECORDER IMPLANT N/A 10/18/2014   Procedure: LOOP RECORDER IMPLANT;  Surgeon: Duke SalviaSteven C Klein, MD;  [facility];  Service: Cardiovascular;  Laterality: N/A;  . TEE WITHOUT CARDIOVERSION N/A 10/18/2014   Procedure: TRANSESOPHAGEAL ECHOCARDIOGRAM (TEE);  Surgeon: Thurmon FairMihai Croitoru, MD;  [facility];  Service: Cardiovascular;  Laterality: N/A;  loop recorder to follow     Current Outpatient Prescriptions  Medication Sig Dispense Refill  . atorvastatin (LIPITOR) 80 MG tablet TAKE 1 TABLET DAILY (TIME FOR APPOINTMENT, SCHEDULE FOR MEDICATIONS) 90 tablet 2  . B-D ULTRAFINE III SHORT PEN 31G X 8 MM MISC USE TWICE A DAY 180 each 0  . clopidogrel (PLAVIX) 75 MG tablet Take 1 tablet (75 mg total) by mouth daily. 10 tablet 0  . empagliflozin (JARDIANCE) 10 MG TABS tablet Take 10 mg by mouth daily.    Marland Kitchen. FREESTYLE LITE test strip USE 1 STRIP TWICE A DAY 200 each 0  . Insulin Glargine-Lixisenatide 100-33 UNT-MCG/ML SOPN Inject 15 Units into the skin. Dr Deloria LairAbby    . metFORMIN (GLUCOPHAGE) 1000 MG tablet Take 1,000 mg by mouth 2 (two) times daily with a meal.     No current facility-administered medications for this visit.     Allergies  Allergen Reactions  . Penicillins     unknown    Review of Systems negative except from HPI and PMH  Physical Exam BP 112/64   Pulse (!) 51   Ht 5\' 10"  (1.778 m)   Wt 169 lb 3.2 oz (76.7 kg)   SpO2 99%   BMI 24.28  kg/m  Well developed and nourished in no acute distress HENT normal Neck supple with JVP-flat Clear Regular rate and rhythm, no murmurs or gallops Abd-soft with active BS No Clubbing cyanosis edema Skin-warm and dry A & Oriented  Grossly normal sensory and motor function   ECG: Sinus Rhythm  @ 55 17/09/43  axis78   Assessment and  Plan  Cryptogenic Stroke  Loop Recorder   No interval atrial fibrillation

## 2017-02-04 ENCOUNTER — Ambulatory Visit (INDEPENDENT_AMBULATORY_CARE_PROVIDER_SITE_OTHER): Payer: BLUE CROSS/BLUE SHIELD | Admitting: *Deleted

## 2017-02-04 DIAGNOSIS — I639 Cerebral infarction, unspecified: Secondary | ICD-10-CM | POA: Diagnosis not present

## 2017-02-07 NOTE — Progress Notes (Signed)
Carelink Summary Report / Loop Recorder 

## 2017-02-08 DIAGNOSIS — Z79899 Other long term (current) drug therapy: Secondary | ICD-10-CM | POA: Diagnosis not present

## 2017-02-08 DIAGNOSIS — E11649 Type 2 diabetes mellitus with hypoglycemia without coma: Secondary | ICD-10-CM | POA: Diagnosis not present

## 2017-02-08 DIAGNOSIS — Z794 Long term (current) use of insulin: Secondary | ICD-10-CM | POA: Diagnosis not present

## 2017-02-12 LAB — CUP PACEART REMOTE DEVICE CHECK
Date Time Interrogation Session: 20180317014207
MDC IDC PG IMPLANT DT: 20151127

## 2017-02-12 NOTE — Progress Notes (Signed)
Carelink summary report received. Battery status OK. Normal device function. No new symptom episodes, tachy episodes, brady, or pause episodes. 4 AF 0.1%- available EGMs appear SR w/ intermittent vent. under sensing. Some artifact also noted. Monthly summary reports and ROV/PRN

## 2017-03-07 ENCOUNTER — Ambulatory Visit (INDEPENDENT_AMBULATORY_CARE_PROVIDER_SITE_OTHER): Payer: BLUE CROSS/BLUE SHIELD | Admitting: *Deleted

## 2017-03-07 DIAGNOSIS — I639 Cerebral infarction, unspecified: Secondary | ICD-10-CM | POA: Diagnosis not present

## 2017-03-07 NOTE — Progress Notes (Signed)
Carelink Summary Report / Loop Recorder 

## 2017-03-20 LAB — CUP PACEART REMOTE DEVICE CHECK
MDC IDC PG IMPLANT DT: 20151127
MDC IDC SESS DTM: 20180416020954

## 2017-03-20 NOTE — Progress Notes (Signed)
Carelink summary report received. Battery status OK. Normal device function. No new symptom episodes, tachy episodes, brady, or pause episodes. 3 AF 0.2%- ECGs appear SR w/ artifact/vent. undersensing. Monthly summary reports and ROV/PRN

## 2017-04-05 ENCOUNTER — Ambulatory Visit (INDEPENDENT_AMBULATORY_CARE_PROVIDER_SITE_OTHER): Payer: Self-pay | Admitting: *Deleted

## 2017-04-05 DIAGNOSIS — I639 Cerebral infarction, unspecified: Secondary | ICD-10-CM

## 2017-04-06 NOTE — Progress Notes (Signed)
Carelink Summary Report / Loop Recorder 

## 2017-04-13 ENCOUNTER — Other Ambulatory Visit: Payer: Self-pay | Admitting: Family Medicine

## 2017-04-16 LAB — CUP PACEART REMOTE DEVICE CHECK
MDC IDC PG IMPLANT DT: 20151127
MDC IDC SESS DTM: 20180516020723

## 2017-04-16 NOTE — Progress Notes (Signed)
Carelink summary report received. Battery status OK. Normal device function. No new symptom episodes, tachy episodes, brady, or pause episodes. No new AF episodes. Monthly summary reports and ROV/PRN 

## 2017-04-18 ENCOUNTER — Other Ambulatory Visit: Payer: Self-pay | Admitting: Family Medicine

## 2017-04-19 ENCOUNTER — Other Ambulatory Visit: Payer: Self-pay

## 2017-04-19 ENCOUNTER — Telehealth: Payer: Self-pay | Admitting: Internal Medicine

## 2017-04-19 NOTE — Telephone Encounter (Signed)
°*  STAT* If patient is at the pharmacy, call can be transferred to refill team.   1. Which medications need to be refilled? (please list name of each medication and dose if known) Plavix  ( Needs a new Prescription Sent )   2. Which pharmacy/location (including street and city if local pharmacy) is medication to be sent to?[facility]   3. Do they need a 30 day or 90 day supply? 90

## 2017-04-19 NOTE — Telephone Encounter (Signed)
He was put on this for a stroke. This will need to be filled by the PCP or neurology.

## 2017-04-19 NOTE — Telephone Encounter (Signed)
Jefferey PicaMcGhee, Heather C, RN routed conversation to You 20 minutes ago (3:21 PM)    Jefferey PicaMcGhee, Heather C, RN 21 minutes ago (3:20 PM)      He was put on this for a stroke. This will need to be filled by the PCP or neurology.      Documentation     You  Jefferey PicaMcGhee, Heather C, RN 1 hour ago (2:00 PM)    PCP PUT PT ON PLAVIX BUT NOW THEY WANT DR Graciela HusbandsKLEIN TO TAKE IT OVER, PLEASE ADVISE. (Routing comment)     You  Shinsky,Tricia N [phone]  1 hour ago (1:56 PM)    Outgoing call:  PCP HAS ALWAYS FILLED PLAVIX SO IT WILLHAVE TO GO BACK TO THEM.

## 2017-04-22 ENCOUNTER — Ambulatory Visit (INDEPENDENT_AMBULATORY_CARE_PROVIDER_SITE_OTHER): Payer: BLUE CROSS/BLUE SHIELD | Admitting: Family Medicine

## 2017-04-22 ENCOUNTER — Encounter: Payer: Self-pay | Admitting: Family Medicine

## 2017-04-22 VITALS — BP 110/60 | HR 60 | Ht 70.0 in | Wt 160.0 lb

## 2017-04-22 DIAGNOSIS — E785 Hyperlipidemia, unspecified: Secondary | ICD-10-CM | POA: Diagnosis not present

## 2017-04-22 DIAGNOSIS — I679 Cerebrovascular disease, unspecified: Secondary | ICD-10-CM | POA: Diagnosis not present

## 2017-04-22 MED ORDER — ATORVASTATIN CALCIUM 80 MG PO TABS: ORAL_TABLET | ORAL | 3 refills | Status: DC

## 2017-04-22 MED ORDER — CLOPIDOGREL BISULFATE 75 MG PO TABS: 75.0000 mg | ORAL_TABLET | Freq: Every day | ORAL | 3 refills | Status: DC

## 2017-04-22 NOTE — Progress Notes (Signed)
Name: Maxwell Clark   MRN: [id number]    DOB: [birth year]   Date:04/22/2017       Progress Note  Subjective  Chief Complaint  Chief Complaint  Patient presents with  . Cerebrovascular Accident    needs refill on Plavix    Cerebrovascular Accident  The current episode started more than 1 year ago. The problem has been unchanged (stable). Pertinent negatives include no abdominal pain, anorexia, arthralgias, change in bowel habit, chest pain, chills, congestion, coughing, diaphoresis, fatigue, fever, headaches, joint swelling, myalgias, nausea, neck pain, numbness, rash, sore throat, swollen glands, urinary symptoms, vertigo, visual change, vomiting or weakness. Nothing aggravates the symptoms. Treatments tried: plavix. The treatment provided moderate relief.    No problem-specific Assessment & Plan notes found for this encounter.   Past Medical History:  Diagnosis Date  . Diabetes mellitus without complication (HCC)   . Hyperlipidemia   . Stroke [facility]) 10/15/2014    Past Surgical History:  Procedure Laterality Date  . LOOP RECORDER IMPLANT N/A 10/18/2014   Procedure: LOOP RECORDER IMPLANT;  Surgeon: Duke Salvia, MD;  [facility] C[facility] LAB;  Service: Cardiovascular;  Laterality: N/A;  . TEE WITHOUT CARDIOVERSION N/A 10/18/2014   Procedure: TRANSESOPHAGEAL ECHOCARDIOGRAM (TEE);  Surgeon: Thurmon Fair, MD;  Location: [facility] ENDOSCOPY;  Service: Cardiovascular;  Laterality: N/A;  loop recorder to follow     Family History  Problem Relation Age of Onset  . Atrial fibrillation Mother   . Hypertension Mother   . Lung cancer Father   . Kidney cancer Father     Social History   Social History  . Marital status: Married    Spouse name: Bethann Berkshire  . Number of children: 2  . Years of education: HS   Occupational History  .  Other    Self-employed landscaper   Social History Main Topics  . Smoking status: Never Smoker  . Smokeless tobacco: Former Neurosurgeon    Types: Chew   Comment: quit around 2012  . Alcohol use 0.6 oz/week    1 Cans of beer per week     Comment: Occasional   . Drug use: No  . Sexual activity: Yes   Other Topics Concern  . Not on file   Social History Narrative   Patient lives at home with spouse.   Caffeine Use: 10 cups daily    Allergies  Allergen Reactions  . Penicillins     unknown    Outpatient Medications Prior to Visit  Medication Sig Dispense Refill  . B-D ULTRAFINE III SHORT PEN 31G X 8 MM MISC USE TWICE A DAY 180 each 0  . empagliflozin (JARDIANCE) 10 MG TABS tablet Take 10 mg by mouth daily.    Marland Kitchen FREESTYLE LITE test strip USE 1 STRIP TWICE A DAY 200 each 0  . Insulin Glargine-Lixisenatide 100-33 UNT-MCG/ML SOPN Inject 15 Units into the skin. Dr Deloria Lair    . metFORMIN (GLUCOPHAGE) 1000 MG tablet Take 1,000 mg by mouth 2 (two) times daily with a meal.    . atorvastatin (LIPITOR) 80 MG tablet TAKE 1 TABLET DAILY (TIME FOR APPOINTMENT, SCHEDULE FOR MEDICATIONS) 90 tablet 2  . clopidogrel (PLAVIX) 75 MG tablet Take 1 tablet (75 mg total) by mouth daily. 10 tablet 0   No facility-administered medications prior to visit.     Review of Systems  Constitutional: Negative for chills, diaphoresis, fatigue, fever, malaise/fatigue and weight loss.  HENT: Negative for congestion, ear discharge, ear pain and sore throat.   Eyes:  Negative for blurred vision.  Respiratory: Negative for cough, sputum production, shortness of breath and wheezing.   Cardiovascular: Negative for chest pain, palpitations and leg swelling.  Gastrointestinal: Negative for abdominal pain, anorexia, blood in stool, change in bowel habit, constipation, diarrhea, heartburn, melena, nausea and vomiting.  Genitourinary: Negative for dysuria, frequency, hematuria and urgency.  Musculoskeletal: Negative for arthralgias, back pain, joint pain, joint swelling, myalgias and neck pain.  Skin: Negative for rash.  Neurological: Negative for dizziness, vertigo, tingling,  sensory change, focal weakness, weakness, numbness and headaches.  Endo/Heme/Allergies: Negative for environmental allergies and polydipsia. Does not bruise/bleed easily.  Psychiatric/Behavioral: Negative for depression and suicidal ideas. The patient is not nervous/anxious and does not have insomnia.      Objective  Vitals:   04/22/17 1557  BP: 110/60  Pulse: 60  Weight: 160 lb (72.6 kg)  Height: 5\' 10"  (1.778 m)    Physical Exam  Constitutional: He is oriented to person, place, and time and well-developed, well-nourished, and in no distress.  HENT:  Head: Normocephalic.  Right Ear: External ear normal.  Left Ear: External ear normal.  Nose: Nose normal.  Mouth/Throat: Oropharynx is clear and moist.  Eyes: Conjunctivae and EOM are normal. Pupils are equal, round, and reactive to light. Right eye exhibits no discharge. Left eye exhibits no discharge. No scleral icterus.  Neck: Normal range of motion. Neck supple. No JVD present. No tracheal deviation present. No thyromegaly present.  Cardiovascular: Normal rate, regular rhythm, normal heart sounds and intact distal pulses.  Exam reveals no gallop and no friction rub.   No murmur heard. Pulmonary/Chest: Breath sounds normal. No respiratory distress. He has no wheezes. He has no rales.  Abdominal: Soft. Bowel sounds are normal. He exhibits no mass. There is no hepatosplenomegaly. There is no tenderness. There is no rebound, no guarding and no CVA tenderness.  Musculoskeletal: Normal range of motion. He exhibits no edema or tenderness.  Lymphadenopathy:    He has no cervical adenopathy.  Neurological: He is alert and oriented to person, place, and time. He has normal sensation, normal strength, normal reflexes and intact cranial nerves. No cranial nerve deficit.  Skin: Skin is warm. No rash noted.  Psychiatric: Mood and affect normal.  Nursing note and vitals reviewed.     Assessment & Plan  Problem List Items Addressed This  Visit      Cardiovascular and Mediastinum   Cerebral vascular disease - Primary   Relevant Medications   clopidogrel (PLAVIX) 75 MG tablet   atorvastatin (LIPITOR) 80 MG tablet     Other   Dyslipidemia, goal LDL below 70   Relevant Medications   clopidogrel (PLAVIX) 75 MG tablet   atorvastatin (LIPITOR) 80 MG tablet      Meds ordered this encounter  Medications  . clopidogrel (PLAVIX) 75 MG tablet    Sig: Take 1 tablet (75 mg total) by mouth daily.    Dispense:  90 tablet    Refill:  3  . atorvastatin (LIPITOR) 80 MG tablet    Sig: TAKE 1 TABLET DAILY (TIME FOR APPOINTMENT, SCHEDULE FOR MEDICATIONS)    Dispense:  90 tablet    Refill:  3      Dr. Hayden Rasmusseneanna Canna Nickelson [facility]  04/22/17

## 2017-05-05 ENCOUNTER — Ambulatory Visit (INDEPENDENT_AMBULATORY_CARE_PROVIDER_SITE_OTHER): Payer: BLUE CROSS/BLUE SHIELD | Admitting: *Deleted

## 2017-05-05 DIAGNOSIS — I639 Cerebral infarction, unspecified: Secondary | ICD-10-CM

## 2017-05-06 NOTE — Progress Notes (Signed)
Carelink Summary Report / Loop Recorder 

## 2017-05-09 DIAGNOSIS — Z794 Long term (current) use of insulin: Secondary | ICD-10-CM | POA: Diagnosis not present

## 2017-05-09 DIAGNOSIS — E11649 Type 2 diabetes mellitus with hypoglycemia without coma: Secondary | ICD-10-CM | POA: Diagnosis not present

## 2017-05-13 ENCOUNTER — Other Ambulatory Visit: Payer: Self-pay | Admitting: Internal Medicine

## 2017-05-16 LAB — CUP PACEART REMOTE DEVICE CHECK
Implantable Pulse Generator Implant Date: 20151127
MDC IDC SESS DTM: 20180615020932

## 2017-05-16 NOTE — Progress Notes (Signed)
Carelink summary report received. Battery status OK. Normal device function. No new symptom episodes, tachy episodes, brady, or pause episodes. No new AF episodes. Monthly summary reports and ROV/PRN 

## 2017-05-17 DIAGNOSIS — E1165 Type 2 diabetes mellitus with hyperglycemia: Secondary | ICD-10-CM | POA: Diagnosis not present

## 2017-05-17 DIAGNOSIS — Z794 Long term (current) use of insulin: Secondary | ICD-10-CM | POA: Diagnosis not present

## 2017-06-06 ENCOUNTER — Ambulatory Visit (INDEPENDENT_AMBULATORY_CARE_PROVIDER_SITE_OTHER): Payer: BLUE CROSS/BLUE SHIELD | Admitting: *Deleted

## 2017-06-06 DIAGNOSIS — I639 Cerebral infarction, unspecified: Secondary | ICD-10-CM

## 2017-06-06 NOTE — Progress Notes (Signed)
Carelink Summary Report / Loop Recorder 

## 2017-06-20 LAB — CUP PACEART REMOTE DEVICE CHECK
MDC IDC PG IMPLANT DT: 20151127
MDC IDC SESS DTM: 20180715023859

## 2017-06-20 NOTE — Progress Notes (Signed)
Carelink summary report received. Battery status OK. Normal device function. No new symptom episodes, tachy episodes, brady, or pause episodes. No new AF episodes. Monthly summary reports and ROV/PRN 

## 2017-06-22 ENCOUNTER — Other Ambulatory Visit: Payer: Self-pay | Admitting: Family Medicine

## 2017-06-22 DIAGNOSIS — E785 Hyperlipidemia, unspecified: Secondary | ICD-10-CM

## 2017-06-22 DIAGNOSIS — I679 Cerebrovascular disease, unspecified: Secondary | ICD-10-CM

## 2017-07-04 ENCOUNTER — Ambulatory Visit (INDEPENDENT_AMBULATORY_CARE_PROVIDER_SITE_OTHER): Payer: BLUE CROSS/BLUE SHIELD | Admitting: *Deleted

## 2017-07-04 DIAGNOSIS — I639 Cerebral infarction, unspecified: Secondary | ICD-10-CM

## 2017-07-09 LAB — CUP PACEART REMOTE DEVICE CHECK
Date Time Interrogation Session: 20180814033649
MDC IDC PG IMPLANT DT: 20151127

## 2017-07-09 NOTE — Progress Notes (Signed)
Loop recorder summary report 

## 2017-08-03 ENCOUNTER — Ambulatory Visit (INDEPENDENT_AMBULATORY_CARE_PROVIDER_SITE_OTHER): Payer: BLUE CROSS/BLUE SHIELD | Admitting: *Deleted

## 2017-08-03 DIAGNOSIS — I639 Cerebral infarction, unspecified: Secondary | ICD-10-CM | POA: Diagnosis not present

## 2017-08-04 NOTE — Progress Notes (Signed)
Carelink Summary Report / Loop Recorder 

## 2017-08-08 LAB — CUP PACEART REMOTE DEVICE CHECK
Date Time Interrogation Session: 20180913033646
MDC IDC PG IMPLANT DT: 20151127

## 2017-08-19 DIAGNOSIS — E1165 Type 2 diabetes mellitus with hyperglycemia: Secondary | ICD-10-CM | POA: Diagnosis not present

## 2017-08-19 DIAGNOSIS — Z794 Long term (current) use of insulin: Secondary | ICD-10-CM | POA: Diagnosis not present

## 2017-08-31 DIAGNOSIS — E1165 Type 2 diabetes mellitus with hyperglycemia: Secondary | ICD-10-CM | POA: Diagnosis not present

## 2017-08-31 DIAGNOSIS — E785 Hyperlipidemia, unspecified: Secondary | ICD-10-CM | POA: Diagnosis not present

## 2017-08-31 DIAGNOSIS — E1169 Type 2 diabetes mellitus with other specified complication: Secondary | ICD-10-CM | POA: Diagnosis not present

## 2017-08-31 DIAGNOSIS — Z794 Long term (current) use of insulin: Secondary | ICD-10-CM | POA: Diagnosis not present

## 2017-09-02 ENCOUNTER — Ambulatory Visit (INDEPENDENT_AMBULATORY_CARE_PROVIDER_SITE_OTHER): Payer: BLUE CROSS/BLUE SHIELD | Admitting: *Deleted

## 2017-09-02 DIAGNOSIS — I639 Cerebral infarction, unspecified: Secondary | ICD-10-CM | POA: Diagnosis not present

## 2017-09-05 NOTE — Progress Notes (Signed)
Carelink Summary Report / Loop Recorder 

## 2017-09-06 LAB — CUP PACEART REMOTE DEVICE CHECK
Implantable Pulse Generator Implant Date: 20151127
MDC IDC SESS DTM: 20181013033709

## 2017-10-03 ENCOUNTER — Ambulatory Visit (INDEPENDENT_AMBULATORY_CARE_PROVIDER_SITE_OTHER): Payer: BLUE CROSS/BLUE SHIELD | Admitting: *Deleted

## 2017-10-03 DIAGNOSIS — I639 Cerebral infarction, unspecified: Secondary | ICD-10-CM

## 2017-10-03 NOTE — Progress Notes (Signed)
Carelink Summary Report / Loop Recorder 

## 2017-10-08 LAB — CUP PACEART REMOTE DEVICE CHECK
Implantable Pulse Generator Implant Date: 20151127
MDC IDC SESS DTM: 20181112043741

## 2017-10-12 ENCOUNTER — Other Ambulatory Visit: Payer: Self-pay | Admitting: Family Medicine

## 2017-10-12 DIAGNOSIS — I679 Cerebrovascular disease, unspecified: Secondary | ICD-10-CM

## 2017-10-12 DIAGNOSIS — E785 Hyperlipidemia, unspecified: Secondary | ICD-10-CM

## 2017-10-19 ENCOUNTER — Telehealth: Payer: Self-pay | Admitting: *Deleted

## 2017-10-19 NOTE — Telephone Encounter (Signed)
LMOM (DPR) requesting that patient send a manual Carelink transmission for review.  LINQ needs software update and need to review any "AF" episodes that have not transmitted automatically (all received episodes are false).  Gave [facility] phone number for questions/concerns.

## 2017-11-01 ENCOUNTER — Ambulatory Visit (INDEPENDENT_AMBULATORY_CARE_PROVIDER_SITE_OTHER): Payer: BLUE CROSS/BLUE SHIELD | Admitting: *Deleted

## 2017-11-01 DIAGNOSIS — I639 Cerebral infarction, unspecified: Secondary | ICD-10-CM

## 2017-11-02 ENCOUNTER — Encounter: Payer: Self-pay | Admitting: Family Medicine

## 2017-11-02 ENCOUNTER — Ambulatory Visit (INDEPENDENT_AMBULATORY_CARE_PROVIDER_SITE_OTHER): Payer: BLUE CROSS/BLUE SHIELD | Admitting: Family Medicine

## 2017-11-02 VITALS — BP 112/80 | HR 80 | Ht 70.0 in | Wt 160.0 lb

## 2017-11-02 DIAGNOSIS — Z23 Encounter for immunization: Secondary | ICD-10-CM | POA: Diagnosis not present

## 2017-11-02 DIAGNOSIS — K130 Diseases of lips: Secondary | ICD-10-CM

## 2017-11-02 DIAGNOSIS — S29012A Strain of muscle and tendon of back wall of thorax, initial encounter: Secondary | ICD-10-CM

## 2017-11-02 MED ORDER — CLOTRIMAZOLE-BETAMETHASONE 1-0.05 % EX CREA: 1.0000 "application " | TOPICAL_CREAM | Freq: Two times a day (BID) | CUTANEOUS | 1 refills | Status: DC

## 2017-11-02 MED ORDER — CYCLOBENZAPRINE HCL 10 MG PO TABS: 10.0000 mg | ORAL_TABLET | Freq: Three times a day (TID) | ORAL | 2 refills | Status: DC | PRN

## 2017-11-02 MED ORDER — ETODOLAC 500 MG PO TABS: 500.0000 mg | ORAL_TABLET | Freq: Two times a day (BID) | ORAL | 3 refills | Status: DC

## 2017-11-02 NOTE — Progress Notes (Signed)
Name: Maxwell Clark   MRN: [id number]    DOB: 1956/02/02   Date:11/02/2017       Progress Note  Subjective  Chief Complaint  Chief Complaint  Patient presents with  . Back Pain    started in the upper back and has moved to the R) arm- hurts to carry corn and a backpack leaf blower  . cracked lips    using neosporin- still cracking    Back Pain  This is a new problem. The current episode started in the past 7 days. The problem occurs intermittently. The problem has been waxing and waning since onset. Pain location: thoracoscapular. The quality of the pain is described as aching. The pain does not radiate. The pain is at a severity of 6/10. The pain is moderate. Exacerbated by: backpack blower/large bags of corn. Pertinent negatives include no abdominal pain, bladder incontinence, chest pain, dysuria, fever, headaches, leg pain, numbness, pelvic pain, perianal numbness, tingling or weight loss.    No problem-specific Assessment & Plan notes found for this encounter.   Past Medical History:  Diagnosis Date  . Diabetes mellitus without complication (HCC)   . Hyperlipidemia   . Stroke [facility]) 10/15/2014    Past Surgical History:  Procedure Laterality Date  . LOOP RECORDER IMPLANT N/A 10/18/2014   Procedure: LOOP RECORDER IMPLANT;  Surgeon: Duke Salvia, MD;  Location[facility] CATH LAB;  Service: Cardiovascular;  Laterality: N/A;  . TEE WITHOUT CARDIOVERSION N/A 10/18/2014   Procedure: TRANSESOPHAGEAL ECHOCARDIOGRAM (TEE);  Surgeon: Thurmon Fair, MD;  Location: [facility] ENDOSCOPY;  Service: Cardiovascular;  Laterality: N/A;  loop recorder to follow     Family History  Problem Relation Age of Onset  . Atrial fibrillation Mother   . Hypertension Mother   . Lung cancer Father   . Kidney cancer Father     Social History   Socioeconomic History  . Marital status: Married    Spouse name: Bethann Berkshire  . Number of children: 2  . Years of education: HS  . Highest education level: Not on  file  Social Needs  . Financial resource strain: Not on file  . Food insecurity - worry: Not on file  . Food insecurity - inability: Not on file  . Transportation needs - medical: Not on file  . Transportation needs - non-medical: Not on file  Occupational History    Employer: OTHER    Comment: Self-employed landscaper  Tobacco Use  . Smoking status: Never Smoker  . Smokeless tobacco: Former Neurosurgeon    Types: Chew  . Tobacco comment: quit around 2012  Substance and Sexual Activity  . Alcohol use: Yes    Alcohol/week: 0.6 oz    Types: 1 Cans of beer per week    Comment: Occasional   . Drug use: No  . Sexual activity: Yes  Other Topics Concern  . Not on file  Social History Narrative   Patient lives at home with spouse.   Caffeine Use: 10 cups daily    Allergies  Allergen Reactions  . Penicillins     unknown    Outpatient Medications Prior to Visit  Medication Sig Dispense Refill  . atorvastatin (LIPITOR) 80 MG tablet TAKE 1 TABLET DAILY (TIME FOR APPOINTMENT, SCHEDULE FOR MEDICATIONS) 90 tablet 1  . B-D ULTRAFINE III SHORT PEN 31G X 8 MM MISC USE TWICE A DAY 180 each 0  . clopidogrel (PLAVIX) 75 MG tablet TAKE 1 TABLET DAILY 90 tablet 0  . empagliflozin (JARDIANCE) 10 MG  TABS tablet Take 10 mg by mouth daily.    Marland Kitchen. FREESTYLE LITE test strip USE ONE STRIP DAILY 100 each 1  . Insulin Glargine-Lixisenatide 100-33 UNT-MCG/ML SOPN Inject 15 Units into the skin. Dr Deloria LairAbby    . metFORMIN (GLUCOPHAGE) 1000 MG tablet Take 1,000 mg by mouth 2 (two) times daily with a meal.     No facility-administered medications prior to visit.     Review of Systems  Constitutional: Negative for chills, fever, malaise/fatigue and weight loss.  HENT: Negative for ear discharge, ear pain and sore throat.   Eyes: Negative for blurred vision.  Respiratory: Negative for cough, sputum production, shortness of breath and wheezing.   Cardiovascular: Negative for chest pain, palpitations and leg swelling.   Gastrointestinal: Negative for abdominal pain, blood in stool, constipation, diarrhea, heartburn, melena and nausea.  Genitourinary: Negative for bladder incontinence, dysuria, frequency, hematuria, pelvic pain and urgency.  Musculoskeletal: Positive for back pain. Negative for joint pain, myalgias and neck pain.  Skin: Negative for rash.  Neurological: Negative for dizziness, tingling, sensory change, focal weakness, numbness and headaches.  Endo/Heme/Allergies: Negative for environmental allergies and polydipsia. Does not bruise/bleed easily.  Psychiatric/Behavioral: Negative for depression and suicidal ideas. The patient is not nervous/anxious and does not have insomnia.      Objective  Vitals:   11/02/17 0926  BP: 112/80  Pulse: 80  Weight: 160 lb (72.6 kg)  Height: 5\' 10"  (1.778 m)    Physical Exam  Constitutional: He is oriented to person, place, and time and well-developed, well-nourished, and in no distress.  HENT:  Head: Normocephalic.    Right Ear: External ear normal.  Left Ear: External ear normal.  Nose: Nose normal.  Mouth/Throat: Oropharynx is clear and moist.  Eyes: Conjunctivae and EOM are normal. Pupils are equal, round, and reactive to light. Right eye exhibits no discharge. Left eye exhibits no discharge. No scleral icterus.  Neck: Normal range of motion. Neck supple. No JVD present. No tracheal deviation present. No thyromegaly present.  Cardiovascular: Normal rate, regular rhythm, normal heart sounds and intact distal pulses. Exam reveals no gallop and no friction rub.  No murmur heard. Pulmonary/Chest: Breath sounds normal. No respiratory distress. He has no wheezes. He has no rales.  Abdominal: Soft. Bowel sounds are normal. He exhibits no mass. There is no hepatosplenomegaly. There is no tenderness. There is no rebound, no guarding and no CVA tenderness.  Musculoskeletal: Normal range of motion. He exhibits no edema.       Right shoulder: He exhibits  tenderness.       Arms: ters tenderness and suoraspinatis  Lymphadenopathy:    He has no cervical adenopathy.  Neurological: He is alert and oriented to person, place, and time. He has normal sensation, normal strength, normal reflexes and intact cranial nerves. No cranial nerve deficit.  Skin: Skin is warm. No rash noted.  Erythema corners of mouth  Psychiatric: Mood and affect normal.  Nursing note and vitals reviewed.     Assessment & Plan  Problem List Items Addressed This Visit    None    Visit Diagnoses    Rhomboid muscle strain, initial encounter    -  Primary   Relevant Medications   etodolac (LODINE) 500 MG tablet   cyclobenzaprine (FLEXERIL) 10 MG tablet   Angular cheilitis       Relevant Medications   clotrimazole-betamethasone (LOTRISONE) cream   Influenza vaccine needed       Relevant Orders   Flu Vaccine  QUAD 36+ mos IM (Completed)      Meds ordered this encounter  Medications  . etodolac (LODINE) 500 MG tablet    Sig: Take 1 tablet (500 mg total) by mouth 2 (two) times daily.    Dispense:  60 tablet    Refill:  3  . cyclobenzaprine (FLEXERIL) 10 MG tablet    Sig: Take 1 tablet (10 mg total) by mouth 3 (three) times daily as needed for muscle spasms.    Dispense:  30 tablet    Refill:  2  . clotrimazole-betamethasone (LOTRISONE) cream    Sig: Apply 1 application topically 2 (two) times daily.    Dispense:  30 g    Refill:  1      Dr. Hayden Rasmusseneanna Emberley [facility]  11/02/17

## 2017-11-02 NOTE — Progress Notes (Signed)
Carelink Summary Report / Loop Recorder 

## 2017-11-03 NOTE — Telephone Encounter (Signed)
Spoke with patient's wife, Nicki Guadalajararicia ([place]).  Gave her verbal transmission instructions and she will attempt to send a manual transmission tonight to force a software update.  Patient's wife is appreciative and denies additional questions or concerns at this time.

## 2017-11-08 LAB — CUP PACEART REMOTE DEVICE CHECK
Date Time Interrogation Session: 20181212043711
MDC IDC PG IMPLANT DT: 20151127

## 2017-11-08 NOTE — Telephone Encounter (Signed)
Manual transmission received on 11/05/17.  Software update successful.  All available AF episodes false--SR w/artifact and undersensing.

## 2017-12-01 ENCOUNTER — Ambulatory Visit (INDEPENDENT_AMBULATORY_CARE_PROVIDER_SITE_OTHER): Payer: BLUE CROSS/BLUE SHIELD | Admitting: *Deleted

## 2017-12-01 DIAGNOSIS — I639 Cerebral infarction, unspecified: Secondary | ICD-10-CM | POA: Diagnosis not present

## 2017-12-06 NOTE — Progress Notes (Signed)
Carelink Summary Report / Loop Recorder 

## 2017-12-13 LAB — CUP PACEART REMOTE DEVICE CHECK
Implantable Pulse Generator Implant Date: 20151127
MDC IDC SESS DTM: 20190111043927

## 2017-12-14 DIAGNOSIS — E785 Hyperlipidemia, unspecified: Secondary | ICD-10-CM | POA: Diagnosis not present

## 2017-12-14 DIAGNOSIS — E1165 Type 2 diabetes mellitus with hyperglycemia: Secondary | ICD-10-CM | POA: Diagnosis not present

## 2017-12-14 DIAGNOSIS — E1169 Type 2 diabetes mellitus with other specified complication: Secondary | ICD-10-CM | POA: Diagnosis not present

## 2017-12-14 DIAGNOSIS — Z794 Long term (current) use of insulin: Secondary | ICD-10-CM | POA: Diagnosis not present

## 2017-12-20 ENCOUNTER — Other Ambulatory Visit: Payer: Self-pay | Admitting: Family Medicine

## 2017-12-20 DIAGNOSIS — I679 Cerebrovascular disease, unspecified: Secondary | ICD-10-CM

## 2017-12-20 DIAGNOSIS — E785 Hyperlipidemia, unspecified: Secondary | ICD-10-CM

## 2018-01-02 ENCOUNTER — Ambulatory Visit (INDEPENDENT_AMBULATORY_CARE_PROVIDER_SITE_OTHER): Payer: BLUE CROSS/BLUE SHIELD | Admitting: *Deleted

## 2018-01-02 DIAGNOSIS — I639 Cerebral infarction, unspecified: Secondary | ICD-10-CM

## 2018-01-02 NOTE — Progress Notes (Signed)
Carelink Summary Report / Loop Recorder 

## 2018-01-10 ENCOUNTER — Other Ambulatory Visit: Payer: Self-pay | Admitting: Family Medicine

## 2018-01-10 DIAGNOSIS — I679 Cerebrovascular disease, unspecified: Secondary | ICD-10-CM

## 2018-01-10 DIAGNOSIS — E785 Hyperlipidemia, unspecified: Secondary | ICD-10-CM

## 2018-01-23 ENCOUNTER — Telehealth: Payer: Self-pay | Admitting: *Deleted

## 2018-01-23 NOTE — Telephone Encounter (Signed)
LMOVM for patient's wife, Maxwell Clark(DPR).  Advised that Maxwell Clark is now at RRT (as of 01/18/18).  Offered appointment with Dr. Graciela HusbandsKlein.  Gave [facility] number for Maxwell Clark/patient to call back to discuss.

## 2018-01-24 LAB — CUP PACEART REMOTE DEVICE CHECK
MDC IDC PG IMPLANT DT: 20151127
MDC IDC SESS DTM: 20190210094052

## 2018-02-02 NOTE — Telephone Encounter (Signed)
LMOM requesting call back to the [facility].  Gave direct number. 

## 2018-02-03 ENCOUNTER — Ambulatory Visit (INDEPENDENT_AMBULATORY_CARE_PROVIDER_SITE_OTHER): Payer: BLUE CROSS/BLUE SHIELD | Admitting: *Deleted

## 2018-02-03 DIAGNOSIS — I639 Cerebral infarction, unspecified: Secondary | ICD-10-CM | POA: Diagnosis not present

## 2018-02-03 NOTE — Progress Notes (Signed)
Carelink Summary Report / Loop Recorder 

## 2018-02-08 ENCOUNTER — Encounter: Payer: Self-pay | Admitting: *Deleted

## 2018-02-08 NOTE — Telephone Encounter (Signed)
LMOVM (DPR) advising that patient's LINQ is at RRT and we will plan to discontinue home monitoring.  Advised that I have ordered him a return kit for his home monitor, sent to his home address.  Offered appointment with Dr. Caryl Comes if patient wishes to discuss explant or plan for the future.  [facility] phone number for questions/concerns.  Mailed letter to patient's home address detailing options as well.

## 2018-02-23 ENCOUNTER — Other Ambulatory Visit: Payer: Self-pay | Admitting: Internal Medicine

## 2018-02-28 ENCOUNTER — Other Ambulatory Visit: Payer: Self-pay | Admitting: Family Medicine

## 2018-03-11 LAB — CUP PACEART REMOTE DEVICE CHECK
Implantable Pulse Generator Implant Date: 20151127
MDC IDC SESS DTM: 20190315103602

## 2018-03-20 ENCOUNTER — Other Ambulatory Visit: Payer: Self-pay | Admitting: Family Medicine

## 2018-03-20 DIAGNOSIS — I679 Cerebrovascular disease, unspecified: Secondary | ICD-10-CM

## 2018-03-20 DIAGNOSIS — E785 Hyperlipidemia, unspecified: Secondary | ICD-10-CM

## 2018-03-22 ENCOUNTER — Other Ambulatory Visit: Payer: Self-pay

## 2018-03-24 DIAGNOSIS — E785 Hyperlipidemia, unspecified: Secondary | ICD-10-CM | POA: Diagnosis not present

## 2018-03-24 DIAGNOSIS — E1165 Type 2 diabetes mellitus with hyperglycemia: Secondary | ICD-10-CM | POA: Diagnosis not present

## 2018-03-24 DIAGNOSIS — E1169 Type 2 diabetes mellitus with other specified complication: Secondary | ICD-10-CM | POA: Diagnosis not present

## 2018-03-24 DIAGNOSIS — Z794 Long term (current) use of insulin: Secondary | ICD-10-CM | POA: Diagnosis not present

## 2018-03-26 ENCOUNTER — Other Ambulatory Visit: Payer: Self-pay | Admitting: Family Medicine

## 2018-03-31 DIAGNOSIS — E785 Hyperlipidemia, unspecified: Secondary | ICD-10-CM | POA: Diagnosis not present

## 2018-03-31 DIAGNOSIS — E1165 Type 2 diabetes mellitus with hyperglycemia: Secondary | ICD-10-CM | POA: Diagnosis not present

## 2018-03-31 DIAGNOSIS — E1169 Type 2 diabetes mellitus with other specified complication: Secondary | ICD-10-CM | POA: Diagnosis not present

## 2018-03-31 DIAGNOSIS — Z794 Long term (current) use of insulin: Secondary | ICD-10-CM | POA: Diagnosis not present

## 2018-04-07 ENCOUNTER — Ambulatory Visit: Payer: BLUE CROSS/BLUE SHIELD | Admitting: Family Medicine

## 2018-04-07 ENCOUNTER — Encounter: Payer: Self-pay | Admitting: Family Medicine

## 2018-04-07 VITALS — BP 120/80 | HR 60 | Ht 70.0 in | Wt 168.0 lb

## 2018-04-07 DIAGNOSIS — E1159 Type 2 diabetes mellitus with other circulatory complications: Secondary | ICD-10-CM

## 2018-04-07 DIAGNOSIS — S29012D Strain of muscle and tendon of back wall of thorax, subsequent encounter: Secondary | ICD-10-CM

## 2018-04-07 DIAGNOSIS — E785 Hyperlipidemia, unspecified: Secondary | ICD-10-CM

## 2018-04-07 DIAGNOSIS — Z23 Encounter for immunization: Secondary | ICD-10-CM | POA: Diagnosis not present

## 2018-04-07 DIAGNOSIS — I679 Cerebrovascular disease, unspecified: Secondary | ICD-10-CM

## 2018-04-07 DIAGNOSIS — Z1211 Encounter for screening for malignant neoplasm of colon: Secondary | ICD-10-CM

## 2018-04-07 MED ORDER — ATORVASTATIN CALCIUM 80 MG PO TABS: ORAL_TABLET | ORAL | 1 refills | Status: DC

## 2018-04-07 MED ORDER — ETODOLAC 500 MG PO TABS: 500.0000 mg | ORAL_TABLET | Freq: Two times a day (BID) | ORAL | 11 refills | Status: DC

## 2018-04-07 MED ORDER — CLOPIDOGREL BISULFATE 75 MG PO TABS: 75.0000 mg | ORAL_TABLET | Freq: Every day | ORAL | 1 refills | Status: DC

## 2018-04-07 NOTE — Progress Notes (Signed)
Name: Maxwell Clark   MRN: [id number]    DOB: 09-18-1956   Date:04/07/2018       Progress Note  Subjective  Chief Complaint  Chief Complaint  Patient presents with  . Hyperlipidemia  . Cerebral vascular disease    Plavix  . Arthritis    shoulder- takes etodolac as needed    Hyperlipidemia  This is a chronic problem. The current episode started more than 1 year ago. The problem is controlled. Recent lipid tests were reviewed and are normal. Exacerbating diseases include diabetes. He has no history of chronic renal disease, hypothyroidism, liver disease, obesity or nephrotic syndrome. There are no known factors aggravating his hyperlipidemia. Pertinent negatives include no chest pain, focal sensory loss, focal weakness, leg pain, myalgias or shortness of breath. Current antihyperlipidemic treatment includes statins. The current treatment provides moderate improvement of lipids. There are no compliance problems.  Risk factors for coronary artery disease include dyslipidemia and diabetes mellitus.  Arthritis  Presents for follow-up visit. He reports no pain, stiffness, joint swelling or joint warmth. (Used for past hx of cva/ cerebral vascular disease) The symptoms have been stable. Affected locations include the right shoulder. Pertinent negatives include no diarrhea, dry eyes, dry mouth, dysuria, fatigue, fever, rash, uveitis or weight loss. (Limited range of motion) Compliance with total regimen is 76-100%.  Neurologic Problem  The patient's pertinent negatives include no altered mental status, clumsiness, focal sensory loss, focal weakness, loss of balance, memory loss, near-syncope, slurred speech, syncope, visual change or weakness. Primary symptoms comment: used for past hx of cva/ cerebral vascular disease. This is a chronic problem. The current episode started more than 1 year ago. The problem is unchanged. Pertinent negatives include no abdominal pain, auditory change, aura, back pain,  chest pain, dizziness, fatigue, fever, headaches, nausea, neck pain, palpitations or shortness of breath. There is no history of liver disease.    No problem-specific Assessment & Plan notes found for this encounter.   Past Medical History:  Diagnosis Date  . Diabetes mellitus without complication (HCC)   . Hyperlipidemia   . Stroke [facility]) 10/15/2014    Past Surgical History:  Procedure Laterality Date  . LOOP RECORDER IMPLANT N/A 10/18/2014   Procedure: LOOP RECORDER IMPLANT;  Surgeon: Duke Salvia, MD;  Location: [facility] CATH LAB;  Service: Cardiovascular;  Laterality: N/A;  . TEE WITHOUT CARDIOVERSION N/A 10/18/2014   Procedure: TRANSESOPHAGEAL ECHOCARDIOGRAM (TEE);  Surgeon: Thurmon Fair, MD;  Location: [facility] ENDOSCOPY;  Service: Cardiovascular;  Laterality: N/A;  loop recorder to follow     Family History  Problem Relation Age of Onset  . Atrial fibrillation Mother   . Hypertension Mother   . Lung cancer Father   . Kidney cancer Father     Social History   Socioeconomic History  . Marital status: Married    Spouse name: Bethann Berkshire  . Number of children: 2  . Years of education: HS  . Highest education level: Not on file  Occupational History    Employer: OTHER    Comment: Self-employed landscaper  Social Needs  . Financial resource strain: Not on file  . Food insecurity:    Worry: Not on file    Inability: Not on file  . Transportation needs:    Medical: Not on file    Non-medical: Not on file  Tobacco Use  . Smoking status: Never Smoker  . Smokeless tobacco: Former Neurosurgeon    Types: Chew  . Tobacco comment: quit around 2012  Substance  and Sexual Activity  . Alcohol use: Yes    Alcohol/week: 0.6 oz    Types: 1 Cans of beer per week    Comment: Occasional   . Drug use: No  . Sexual activity: Yes  Lifestyle  . Physical activity:    Days per week: Not on file    Minutes per session: Not on file  . Stress: Not on file  Relationships  . Social connections:     Talks on phone: Not on file    Gets together: Not on file    Attends religious service: Not on file    Active member of club or organization: Not on file    Attends meetings of clubs or organizations: Not on file    Relationship status: Not on file  . Intimate partner violence:    Fear of current or ex partner: Not on file    Emotionally abused: Not on file    Physically abused: Not on file    Forced sexual activity: Not on file  Other Topics Concern  . Not on file  Social History Narrative   Patient lives at home with spouse.   Caffeine Use: 10 cups daily    Allergies  Allergen Reactions  . Penicillins     unknown    Outpatient Medications Prior to Visit  Medication Sig Dispense Refill  . B-D ULTRAFINE III SHORT PEN 31G X 8 MM MISC USE TWICE A DAY 180 each 0  . cyclobenzaprine (FLEXERIL) 10 MG tablet Take 1 tablet (10 mg total) by mouth 3 (three) times daily as needed for muscle spasms. 30 tablet 2  . empagliflozin (JARDIANCE) 10 MG TABS tablet Take 10 mg by mouth daily. Dr O'ConGershon Crane  . FREESTYLE LITE test strip TEST ONCE PER DAY 50 each 0  . Insulin Glargine-Lixisenatide 100-33 UNT-MCG/ML SOPN Inject 15 Units into the skin. Dr Gershon Crane    . atorvastatin (LIPITOR) 80 MG tablet TAKE 1 TABLET DAILY (TIME FOR APPOINTMENT, SCHEDULE FOR MEDICATIONS) 90 tablet 0  . clopidogrel (PLAVIX) 75 MG tablet TAKE 1 TABLET DAILY 90 tablet 0  . etodolac (LODINE) 500 MG tablet Take 1 tablet (500 mg total) by mouth 2 (two) times daily. 60 tablet 3  . metFORMIN (GLUCOPHAGE) 1000 MG tablet Take 1,000 mg by mouth 2 (two) times daily with a meal.    . clotrimazole-betamethasone (LOTRISONE) cream Apply 1 application topically 2 (two) times daily. 30 g 1   No facility-administered medications prior to visit.     Review of Systems  Constitutional: Negative for chills, fatigue, fever, malaise/fatigue and weight loss.  HENT: Negative for ear discharge, ear pain and sore throat.   Eyes: Negative for  blurred vision.  Respiratory: Negative for cough, sputum production, shortness of breath and wheezing.   Cardiovascular: Negative for chest pain, palpitations, leg swelling and near-syncope.  Gastrointestinal: Negative for abdominal pain, blood in stool, constipation, diarrhea, heartburn, melena and nausea.  Genitourinary: Negative for dysuria, frequency, hematuria and urgency.  Musculoskeletal: Positive for arthritis. Negative for back pain, joint pain, joint swelling, myalgias, neck pain and stiffness.  Skin: Negative for rash.  Neurological: Negative for dizziness, tingling, sensory change, focal weakness, syncope, weakness, headaches and loss of balance.  Endo/Heme/Allergies: Negative for environmental allergies and polydipsia. Does not bruise/bleed easily.  Psychiatric/Behavioral: Negative for depression, memory loss and suicidal ideas. The patient is not nervous/anxious and does not have insomnia.      Objective  Vitals:   04/07/18 0814  BP: 120/80  Pulse: 60  Weight: 168 lb (76.2 kg)  Height:  (1.778 m)    Physical Exam  Constitutional: He is oriented to person, place, and time.  HENT:  Head: Normocephalic.  Right Ear: Hearing, tympanic membrane, external ear and ear canal normal.  Left Ear: Hearing, tympanic membrane, external ear and ear canal normal.  Nose: Nose normal.  Mouth/Throat: Uvula is midline and oropharynx is clear and moist. No oropharyngeal exudate, posterior oropharyngeal edema or posterior oropharyngeal erythema.  Eyes: Pupils are equal, round, and reactive to light. Conjunctivae and EOM are normal. Right eye exhibits no discharge. Left eye exhibits no discharge. No scleral icterus.  Neck: Normal range of motion. Neck supple. Normal carotid pulses, no hepatojugular reflux and no JVD present. Carotid bruit is not present. No tracheal deviation present. No thyromegaly present.  Cardiovascular: Normal rate, regular rhythm, S1 normal, S2 normal, normal heart  sounds and intact distal pulses. Exam reveals no gallop, no S3, no S4 and no friction rub.  No murmur heard. Pulmonary/Chest: Breath sounds normal. No respiratory distress. He has no wheezes. He has no rales.  Abdominal: Soft. Bowel sounds are normal. He exhibits no mass. There is no hepatosplenomegaly. There is no tenderness. There is no rebound, no guarding and no CVA tenderness.  Musculoskeletal: Normal range of motion. He exhibits no edema or tenderness.  Lymphadenopathy:       Head (right side): No submandibular adenopathy present.       Head (left side): No submandibular adenopathy present.    He has no cervical adenopathy.  Neurological: He is alert and oriented to person, place, and time. He has normal strength and normal reflexes. No cranial nerve deficit.  Skin: Skin is warm. No rash noted.  Nursing note and vitals reviewed.     Assessment & Plan  Problem List Items Addressed This Visit      Cardiovascular and Mediastinum   Cerebral vascular disease - Primary   Relevant Medications   clopidogrel (PLAVIX) 75 MG tablet   atorvastatin (LIPITOR) 80 MG tablet   Other Relevant Orders   Lipid panel     Endocrine   DM (diabetes mellitus) (HCC)   Relevant Medications   atorvastatin (LIPITOR) 80 MG tablet     Other   Dyslipidemia, goal LDL below 70   Relevant Medications   clopidogrel (PLAVIX) 75 MG tablet   atorvastatin (LIPITOR) 80 MG tablet   Other Relevant Orders   Lipid panel    Other Visit Diagnoses    Rhomboid muscle strain, subsequent encounter       stable with occ flare/on prn etodolac   Relevant Medications   etodolac (LODINE) 500 MG tablet   Need for diphtheria-tetanus-pertussis (Tdap) vaccine       Colon cancer screening       call to check on status      Meds ordered this encounter  Medications  . clopidogrel (PLAVIX) 75 MG tablet    Sig: Take 1 tablet (75 mg total) by mouth daily.    Dispense:  90 tablet    Refill:  1  . atorvastatin (LIPITOR)  80 MG tablet    Sig: TAKE 1 TABLET DAILY (TIME FOR APPOINTMENT, SCHEDULE FOR MEDICATIONS)    Dispense:  90 tablet    Refill:  1  . etodolac (LODINE) 500 MG tablet    Sig: Take 1 tablet (500 mg total) by mouth 2 (two) times daily.    Dispense:  60 tablet    Refill:  11  Dr. [facility]den [facility]  04/07/18

## 2018-04-08 LAB — LIPID PANEL
CHOL/HDL RATIO: 1.7 ratio (ref 0.0–5.0)
Cholesterol, Total: 124 mg/dL (ref 100–199)
HDL: 72 mg/dL (ref >39)
LDL CALC: 43 mg/dL (ref 0–99)
Triglycerides: 44 mg/dL (ref 0–149)
VLDL CHOLESTEROL CAL: 9 mg/dL (ref 5–40)

## 2018-04-10 ENCOUNTER — Other Ambulatory Visit: Payer: Self-pay | Admitting: Family Medicine

## 2018-04-10 DIAGNOSIS — E785 Hyperlipidemia, unspecified: Secondary | ICD-10-CM

## 2018-04-10 DIAGNOSIS — I679 Cerebrovascular disease, unspecified: Secondary | ICD-10-CM

## 2018-04-27 ENCOUNTER — Other Ambulatory Visit: Payer: Self-pay | Admitting: Family Medicine

## 2018-04-28 ENCOUNTER — Ambulatory Visit: Payer: BLUE CROSS/BLUE SHIELD | Admitting: Internal Medicine

## 2018-04-28 ENCOUNTER — Encounter: Payer: Self-pay | Admitting: Internal Medicine

## 2018-04-28 VITALS — BP 102/66 | HR 64 | Ht 70.0 in | Wt 163.0 lb

## 2018-04-28 DIAGNOSIS — I639 Cerebral infarction, unspecified: Secondary | ICD-10-CM

## 2018-04-28 LAB — CUP PACEART INCLINIC DEVICE CHECK
Date Time Interrogation Session: 20190607133645
MDC IDC PG IMPLANT DT: 20151127

## 2018-04-28 NOTE — Progress Notes (Signed)
      Patient Care Team: Duanne LimerickJones, Deanna C, MD as PCP - General (Family Medicine)   HPI  Maxwell Clark is a 62 y.o. male Seen in followup of ILR implanted 11/15 for Cryptogenic Stroke   Echo was normal.  He has hx of HTN and DM     The patient denies chest pain, shortness of breath, nocturnal dyspnea, orthopnea or peripheral edema.  There have been no palpitations, lightheadedness or syncope.   Loop recorder     Past Medical History:  Diagnosis Date  . Diabetes mellitus without complication (HCC)   . Hyperlipidemia   . Stroke [facility](HCC) 10/15/2014    Past Surgical History:  Procedure Laterality Date  . LOOP RECORDER IMPLANT N/A 10/18/2014   Procedure: LOOP RECORDER IMPLANT;  Surgeon: Duke SalviaSteven C Reshawn Ostlund, MD;  [facility];  Service: Cardiovascular;  Laterality: N/A;  . TEE WITHOUT CARDIOVERSION N/A 10/18/2014   Procedure: TRANSESOPHAGEAL ECHOCARDIOGRAM (TEE);  Surgeon: Thurmon FairMihai Croitoru, MD;  [facility];  Service: Cardiovascular;  Laterality: N/A;  loop recorder to follow     Current Outpatient Medications  Medication Sig Dispense Refill  . atorvastatin (LIPITOR) 80 MG tablet TAKE 1 TABLET DAILY (TIME FOR APPOINTMENT, SCHEDULE FOR MEDICATIONS) 90 tablet 1  . clopidogrel (PLAVIX) 75 MG tablet TAKE 1 TABLET DAILY 90 tablet 0  . empagliflozin (JARDIANCE) 10 MG TABS tablet Take 10 mg by mouth daily. Dr Gershon Crane'Connell    . glimepiride (AMARYL) 1 MG tablet Take 1 mg by mouth daily.    . Insulin Glargine-Lixisenatide 100-33 UNT-MCG/ML SOPN Inject 15 Units into the skin. Dr Gershon Crane'Connell    . metFORMIN (GLUCOPHAGE) 1000 MG tablet Take 1,000 mg by mouth 2 (two) times daily with a meal.     No current facility-administered medications for this visit.     Allergies  Allergen Reactions  . Penicillins     unknown    Review of Systems negative except from HPI and PMH  Physical Exam BP 102/66   Pulse 64   Ht 5\' 10"  (1.778 m)   Wt 163 lb (73.9 kg)   SpO2 97%   BMI 23.39  kg/m  Well developed and nourished in no acute distress HENT normal Neck supple with JVP-flat Clear Regular rate and rhythm, no murmurs or gallops Abd-soft with active BS No Clubbing cyanosis edema Skin-warm and dry A & Oriented  Grossly normal sensory and motor function apart from residual dysarthria     ECG:  None  Assessment and Plan  Cryptogenic Stroke  Loop Recorder now at EOS   No intercurrent atrial fibrillation or flutter  Would like to have LINQ removed 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

## 2018-04-28 NOTE — Patient Instructions (Signed)
Medication Instructions:  Your physician recommends that you continue on your current medications as directed. Please refer to the Current Medication list given to you today.  Labwork: None ordered.  Testing/Procedures: None ordered.  Follow-Up: Your physician recommends that you schedule a follow-up appointment as needed with Dr Graciela HusbandsKlein.  Please call us around November to discuss Linq removal if you are still interested.   Any Other Special Instructions Will Be Listed Below (If Applicable).     If you need a refill on your cardiac medications before your next appointment, please call your pharmacy.

## 2018-06-18 ENCOUNTER — Other Ambulatory Visit: Payer: Self-pay | Admitting: Family Medicine

## 2018-06-18 DIAGNOSIS — I679 Cerebrovascular disease, unspecified: Secondary | ICD-10-CM

## 2018-06-18 DIAGNOSIS — E785 Hyperlipidemia, unspecified: Secondary | ICD-10-CM

## 2018-07-04 DIAGNOSIS — E1165 Type 2 diabetes mellitus with hyperglycemia: Secondary | ICD-10-CM | POA: Diagnosis not present

## 2018-07-04 DIAGNOSIS — Z794 Long term (current) use of insulin: Secondary | ICD-10-CM | POA: Diagnosis not present

## 2018-07-07 DIAGNOSIS — E1165 Type 2 diabetes mellitus with hyperglycemia: Secondary | ICD-10-CM | POA: Diagnosis not present

## 2018-07-07 DIAGNOSIS — E1169 Type 2 diabetes mellitus with other specified complication: Secondary | ICD-10-CM | POA: Diagnosis not present

## 2018-07-07 DIAGNOSIS — Z794 Long term (current) use of insulin: Secondary | ICD-10-CM | POA: Diagnosis not present

## 2018-07-07 DIAGNOSIS — E785 Hyperlipidemia, unspecified: Secondary | ICD-10-CM | POA: Diagnosis not present

## 2018-07-09 ENCOUNTER — Other Ambulatory Visit: Payer: Self-pay | Admitting: Family Medicine

## 2018-07-09 DIAGNOSIS — I679 Cerebrovascular disease, unspecified: Secondary | ICD-10-CM

## 2018-07-09 DIAGNOSIS — E785 Hyperlipidemia, unspecified: Secondary | ICD-10-CM

## 2018-07-17 DIAGNOSIS — S0501XA Injury of conjunctiva and corneal abrasion without foreign body, right eye, initial encounter: Secondary | ICD-10-CM | POA: Diagnosis not present

## 2018-07-19 DIAGNOSIS — S0501XD Injury of conjunctiva and corneal abrasion without foreign body, right eye, subsequent encounter: Secondary | ICD-10-CM | POA: Diagnosis not present

## 2018-07-20 ENCOUNTER — Other Ambulatory Visit: Payer: Self-pay | Admitting: Family Medicine

## 2018-07-21 ENCOUNTER — Other Ambulatory Visit
Admission: RE | Admit: 2018-07-21 | Discharge: 2018-07-21 | Disposition: A | Discharge status: Home / Self Care | Payer: BLUE CROSS/BLUE SHIELD | Source: Ambulatory Visit | Attending: Ophthalmology | Admitting: Ophthalmology

## 2018-07-21 DIAGNOSIS — H16001 Unspecified corneal ulcer, right eye: Secondary | ICD-10-CM | POA: Insufficient documentation

## 2018-07-22 DIAGNOSIS — H16001 Unspecified corneal ulcer, right eye: Secondary | ICD-10-CM | POA: Diagnosis not present

## 2018-07-23 DIAGNOSIS — H16001 Unspecified corneal ulcer, right eye: Secondary | ICD-10-CM | POA: Diagnosis not present

## 2018-07-23 LAB — EYE CULTURE
CULTURE: NO GROWTH
Gram Stain: NONE SEEN

## 2018-07-26 DIAGNOSIS — H16001 Unspecified corneal ulcer, right eye: Secondary | ICD-10-CM | POA: Diagnosis not present

## 2018-07-28 DIAGNOSIS — H16001 Unspecified corneal ulcer, right eye: Secondary | ICD-10-CM | POA: Diagnosis not present

## 2018-07-31 DIAGNOSIS — H16001 Unspecified corneal ulcer, right eye: Secondary | ICD-10-CM | POA: Diagnosis not present

## 2018-08-02 DIAGNOSIS — H16001 Unspecified corneal ulcer, right eye: Secondary | ICD-10-CM | POA: Diagnosis not present

## 2018-08-03 LAB — ORGANISM IDENTIFICATION, MOLD

## 2018-08-03 LAB — MOLD ORGANISM REFLEX

## 2018-08-04 ENCOUNTER — Other Ambulatory Visit: Payer: Self-pay | Admitting: Internal Medicine

## 2018-08-08 DIAGNOSIS — H16001 Unspecified corneal ulcer, right eye: Secondary | ICD-10-CM | POA: Diagnosis not present

## 2018-08-12 LAB — CULTURE, FUNGUS WITHOUT SMEAR

## 2018-08-16 DIAGNOSIS — H16001 Unspecified corneal ulcer, right eye: Secondary | ICD-10-CM | POA: Diagnosis not present

## 2018-09-29 DIAGNOSIS — Z23 Encounter for immunization: Secondary | ICD-10-CM | POA: Diagnosis not present

## 2018-09-29 DIAGNOSIS — H179 Unspecified corneal scar and opacity: Secondary | ICD-10-CM | POA: Diagnosis not present

## 2018-09-29 DIAGNOSIS — H2513 Age-related nuclear cataract, bilateral: Secondary | ICD-10-CM | POA: Diagnosis not present

## 2018-10-07 ENCOUNTER — Other Ambulatory Visit: Payer: Self-pay | Admitting: Family Medicine

## 2018-10-07 DIAGNOSIS — I679 Cerebrovascular disease, unspecified: Secondary | ICD-10-CM

## 2018-10-07 DIAGNOSIS — E785 Hyperlipidemia, unspecified: Secondary | ICD-10-CM

## 2018-10-13 ENCOUNTER — Encounter: Payer: Self-pay | Admitting: Family Medicine

## 2018-10-13 ENCOUNTER — Ambulatory Visit (INDEPENDENT_AMBULATORY_CARE_PROVIDER_SITE_OTHER): Payer: BLUE CROSS/BLUE SHIELD | Admitting: Family Medicine

## 2018-10-13 VITALS — BP 124/70 | HR 80 | Ht 70.0 in | Wt 155.0 lb

## 2018-10-13 DIAGNOSIS — K130 Diseases of lips: Secondary | ICD-10-CM

## 2018-10-13 DIAGNOSIS — E785 Hyperlipidemia, unspecified: Secondary | ICD-10-CM

## 2018-10-13 DIAGNOSIS — I679 Cerebrovascular disease, unspecified: Secondary | ICD-10-CM | POA: Diagnosis not present

## 2018-10-13 MED ORDER — CLOPIDOGREL BISULFATE 75 MG PO TABS: 75.0000 mg | ORAL_TABLET | Freq: Every day | ORAL | 1 refills | Status: DC

## 2018-10-13 MED ORDER — CLOTRIMAZOLE-BETAMETHASONE 1-0.05 % EX CREA: 1.0000 "application " | TOPICAL_CREAM | Freq: Two times a day (BID) | CUTANEOUS | 1 refills | Status: DC

## 2018-10-13 MED ORDER — ATORVASTATIN CALCIUM 80 MG PO TABS: ORAL_TABLET | ORAL | 1 refills | Status: DC

## 2018-10-13 NOTE — Progress Notes (Signed)
Date:  10/13/2018   Name:  Maxwell Clark   DOB:  1956/10/22   MRN:  [id number]   Chief Complaint: Hyperlipidemia and Cerebrovascular Accident Hyperlipidemia  This is a chronic problem. The current episode started more than 1 year ago. The problem is controlled. Recent lipid tests were reviewed and are normal. He has no history of chronic renal disease, diabetes, hypothyroidism, liver disease, obesity or nephrotic syndrome. There are no known factors aggravating his hyperlipidemia. Pertinent negatives include no chest pain, focal sensory loss, focal weakness, leg pain, myalgias or shortness of breath. Current antihyperlipidemic treatment includes statins. The current treatment provides moderate improvement of lipids. There are no compliance problems.   Neurologic Problem  The patient's pertinent negatives include no altered mental status, clumsiness, focal sensory loss, focal weakness, loss of balance, memory loss, near-syncope, slurred speech, syncope, visual change or weakness. The current episode started more than 1 year ago. The problem has been gradually improving since onset. There was no focality noted. Pertinent negatives include no abdominal pain, auditory change, aura, back pain, bladder incontinence, bowel incontinence, chest pain, confusion, diaphoresis, dizziness, fatigue, fever, headaches, light-headedness, nausea, neck pain, palpitations, shortness of breath, vertigo or vomiting. Treatments tried: plavix. The treatment provided moderate relief. There is no history of liver disease.     Review of Systems  Constitutional: Negative for chills, diaphoresis, fatigue and fever.  HENT: Negative for drooling, ear discharge, ear pain and sore throat.   Respiratory: Negative for cough, shortness of breath and wheezing.   Cardiovascular: Negative for chest pain, palpitations, leg swelling and near-syncope.  Gastrointestinal: Negative for abdominal pain, blood in stool, bowel  incontinence, constipation, diarrhea, nausea and vomiting.  Endocrine: Negative for polydipsia.  Genitourinary: Negative for bladder incontinence, dysuria, frequency, hematuria and urgency.  Musculoskeletal: Negative for back pain, myalgias and neck pain.  Skin: Negative for rash.  Allergic/Immunologic: Negative for environmental allergies.  Neurological: Negative for dizziness, vertigo, focal weakness, syncope, weakness, light-headedness, headaches and loss of balance.  Hematological: Does not bruise/bleed easily.  Psychiatric/Behavioral: Negative for confusion, memory loss and suicidal ideas. The patient is not nervous/anxious.     Patient Active Problem List   Diagnosis Date Noted  . Routine general medical examination at a health care facility 03/18/2015  . Type II diabetes mellitus with neurological manifestations (HCC) 03/18/2015  . Ischemic stroke (HCC) 03/18/2015  . Combined fat and carbohydrate induced hyperlipemia 03/18/2015  . Cerebral infarction due to embolism of left middle cerebral artery (HCC) 02/26/2015  . DM (diabetes mellitus) (HCC) 02/26/2015  . HLD (hyperlipidemia) 02/26/2015  . Dyslipidemia, goal LDL below 70 10/18/2014  . Acute ischemic stroke (HCC) 10/15/2014  . Aphasia due to stroke 10/15/2014  . Cerebral vascular disease 10/15/2014    Allergies  Allergen Reactions  . Penicillins     unknown    Past Surgical History:  Procedure Laterality Date  . LOOP RECORDER IMPLANT N/A 10/18/2014   Procedure: LOOP RECORDER IMPLANT;  Surgeon: Duke SalviaSteven C Klein, MD;  [facility];  Service: Cardiovascular;  Laterality: N/A;  . TEE WITHOUT CARDIOVERSION N/A 10/18/2014   Procedure: TRANSESOPHAGEAL ECHOCARDIOGRAM (TEE);  Surgeon: Thurmon FairMihai Croitoru, MD;  [facility];  Service: Cardiovascular;  Laterality: N/A;  loop recorder to follow     Social History   Tobacco Use  . Smoking status: Never Smoker  . Smokeless tobacco: Former NeurosurgeonUser    Types: Chew  .  Tobacco comment: quit around 2012  Substance Use Topics  . Alcohol use:  Yes    Alcohol/week: 1.0 standard drinks    Types: 1 Cans of beer per week    Comment: Occasional   . Drug use: No     Medication list has been reviewed and updated.  Current Meds  Medication Sig  . atorvastatin (LIPITOR) 80 MG tablet TAKE 1 TABLET DAILY (TIME FOR APPOINTMENT, SCHEDULE FOR MEDICATIONS)  . clopidogrel (PLAVIX) 75 MG tablet TAKE 1 TABLET DAILY  . empagliflozin (JARDIANCE) 10 MG TABS tablet Take 10 mg by mouth daily. Dr Gershon Crane  . glimepiride (AMARYL) 1 MG tablet Take 1 mg by mouth daily.  . Insulin Glargine-Lixisenatide 100-33 UNT-MCG/ML SOPN Inject 15 Units into the skin. Dr Gershon Crane  . metFORMIN (GLUCOPHAGE) 1000 MG tablet Take 1,000 mg by mouth 2 (two) times daily with a meal.    PHQ 2/9 Scores 11/02/2017 12/11/2015  PHQ - 2 Score 0 0  PHQ- 9 Score 0 -    Physical Exam  Constitutional: He is oriented to person, place, and time.  HENT:  Head: Normocephalic.  Right Ear: External ear normal.  Left Ear: External ear normal.  Nose: Nose normal.  Mouth/Throat: Oropharynx is clear and moist.  Eyes: Pupils are equal, round, and reactive to light. Conjunctivae and EOM are normal. Right eye exhibits no discharge. Left eye exhibits no discharge. No scleral icterus.  Neck: Normal range of motion. Neck supple. No JVD present. No tracheal deviation present. No thyromegaly present.  Cardiovascular: Normal rate, regular rhythm, normal heart sounds and intact distal pulses. Exam reveals no gallop and no friction rub.  No murmur heard. Pulmonary/Chest: Breath sounds normal. No respiratory distress. He has no wheezes. He has no rales.  Abdominal: Soft. Bowel sounds are normal. He exhibits no mass. There is no hepatosplenomegaly. There is no tenderness. There is no rebound, no guarding and no CVA tenderness.  Musculoskeletal: Normal range of motion. He exhibits no edema or tenderness.  Lymphadenopathy:      He has no cervical adenopathy.  Neurological: He is alert and oriented to person, place, and time. He has normal strength and normal reflexes. No cranial nerve deficit.  Skin: Skin is warm. No rash noted.  Nursing note and vitals reviewed.   BP 124/70   Pulse 80   Ht 5\' 10"  (1.778 m)   Wt 155 lb (70.3 kg)   BMI 22.24 kg/m   Assessment and Plan:  1. Cerebral vascular disease Chronic controlled stable.  No progression of disease in past interval.  Continue Plavix 75 mg daily. - clopidogrel (PLAVIX) 75 MG tablet; Take 1 tablet (75 mg total) by mouth daily.  Dispense: 90 tablet; Refill: 1 - atorvastatin (LIPITOR) 80 MG tablet; One a day  Dispense: 90 tablet; Refill: 1  2. Dyslipidemia, goal LDL below 70 Chronic.  Controlled.  Goal is below 70 will continue atorvastatin 80 mg daily and will check lipid panel today. - clopidogrel (PLAVIX) 75 MG tablet; Take 1 tablet (75 mg total) by mouth daily.  Dispense: 90 tablet; Refill: 1 - atorvastatin (LIPITOR) 80 MG tablet; One a day  Dispense: 90 tablet; Refill: 1 - Lipid panel  3. Angular cheilitis Chronic.  Recurrent.  Controlled with clotrimazole betamethasone.  Apply twice daily as needed. - clotrimazole-betamethasone (LOTRISONE) cream; Apply 1 application topically 2 (two) times daily.  Dispense: 30 g; Refill: 1   Dr. [facility]  10/13/2018

## 2018-10-14 LAB — LIPID PANEL
CHOLESTEROL TOTAL: 132 mg/dL (ref 100–199)
Chol/HDL Ratio: 1.7 ratio (ref 0.0–5.0)
HDL: 80 mg/dL (ref >39)
LDL CALC: 37 mg/dL (ref 0–99)
TRIGLYCERIDES: 77 mg/dL (ref 0–149)
VLDL CHOLESTEROL CAL: 15 mg/dL (ref 5–40)

## 2018-10-30 ENCOUNTER — Other Ambulatory Visit: Payer: Self-pay

## 2018-10-30 DIAGNOSIS — K130 Diseases of lips: Secondary | ICD-10-CM

## 2018-10-30 MED ORDER — CLOTRIMAZOLE-BETAMETHASONE 1-0.05 % EX CREA: 1.0000 "application " | TOPICAL_CREAM | Freq: Two times a day (BID) | CUTANEOUS | 1 refills | Status: DC

## 2018-11-07 ENCOUNTER — Telehealth: Payer: Self-pay

## 2018-11-07 NOTE — Telephone Encounter (Signed)
LVM to discuss explanting pt's linq device.

## 2018-11-07 NOTE — Telephone Encounter (Signed)
-----   Message from Oretha MilchLorren Shereka Lafortune, RN sent at 04/28/2018 12:06 PM EDT ----- Regarding: Linq Removal Call pt to discuss Iinq removal. 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

## 2018-11-10 DIAGNOSIS — E785 Hyperlipidemia, unspecified: Secondary | ICD-10-CM | POA: Diagnosis not present

## 2018-11-10 DIAGNOSIS — Z794 Long term (current) use of insulin: Secondary | ICD-10-CM | POA: Diagnosis not present

## 2018-11-10 DIAGNOSIS — E1169 Type 2 diabetes mellitus with other specified complication: Secondary | ICD-10-CM | POA: Diagnosis not present

## 2018-11-10 DIAGNOSIS — E1165 Type 2 diabetes mellitus with hyperglycemia: Secondary | ICD-10-CM | POA: Diagnosis not present

## 2018-11-24 ENCOUNTER — Telehealth: Payer: Self-pay

## 2018-11-24 NOTE — Telephone Encounter (Signed)
Pt's wife calling to set up an appointment to have his linq removed. [place] in [place], [place]. Device clinic was messaged.

## 2018-12-14 ENCOUNTER — Other Ambulatory Visit: Payer: Self-pay

## 2018-12-14 DIAGNOSIS — I679 Cerebrovascular disease, unspecified: Secondary | ICD-10-CM

## 2018-12-14 DIAGNOSIS — E785 Hyperlipidemia, unspecified: Secondary | ICD-10-CM

## 2018-12-15 ENCOUNTER — Other Ambulatory Visit: Payer: Self-pay | Admitting: Family Medicine

## 2018-12-15 DIAGNOSIS — E785 Hyperlipidemia, unspecified: Secondary | ICD-10-CM

## 2018-12-15 DIAGNOSIS — I679 Cerebrovascular disease, unspecified: Secondary | ICD-10-CM

## 2019-01-06 ENCOUNTER — Other Ambulatory Visit: Payer: Self-pay | Admitting: Family Medicine

## 2019-01-06 DIAGNOSIS — E785 Hyperlipidemia, unspecified: Secondary | ICD-10-CM

## 2019-01-06 DIAGNOSIS — I679 Cerebrovascular disease, unspecified: Secondary | ICD-10-CM

## 2019-01-08 NOTE — Progress Notes (Signed)
Will think     Patient Care Team: Duanne Limerick, MD as PCP - General (Family Medicine)   HPI  Maxwell Clark is a 63 y.o. male who reports for followup of ILR implanted on 11/15 for Cryptogenic Stroke. He has a history of HTN and DM. Loop recorder.   INTERVAL HISTORY He is doing well today and is accompanied by his wife. He continues to work. The patient still struggles with finding the correct words for his thoughts and his wife also struggles to understand some of what he is trying to say. He understands, if he slows down when speaking it can be easier to understand him. He would like to have his loop recorder removed if possible today. He denies SOB, extremity edema or any other related symptoms.     Past Medical History:  Diagnosis Date   Diabetes mellitus without complication (HCC)    Hyperlipidemia    Stroke (HCC) 10/15/2014    Past Surgical History:  Procedure Laterality Date   LOOP RECORDER IMPLANT N/A 10/18/2014   Procedure: LOOP RECORDER IMPLANT;  Surgeon: Duke Salvia, MD;  [facility] C[facility] LAB;  Service: Cardiovascular;  Laterality: N/A;   TEE WITHOUT CARDIOVERSION N/A 10/18/2014   Procedure: TRANSESOPHAGEAL ECHOCARDIOGRAM (TEE);  Surgeon: Thurmon Fair, MD;  [facility] ENDOSCOPY;  Service: Cardiovascular;  Laterality: N/A;  loop recorder to follow     Current Outpatient Medications  Medication Sig Dispense Refill   atorvastatin (LIPITOR) 80 MG tablet One a day 90 tablet 1   clopidogrel (PLAVIX) 75 MG tablet TAKE 1 TABLET DAILY 90 tablet 0   clotrimazole-betamethasone (LOTRISONE) cream Apply 1 application topically 2 (two) times daily. 30 g 1   empagliflozin (JARDIANCE) 10 MG TABS tablet Take 10 mg by mouth daily. Dr Gershon Crane     Insulin Glargine-Lixisenatide 100-33 UNT-MCG/ML SOPN Inject 15 Units into the skin. Dr Gershon Crane     metFORMIN (GLUCOPHAGE) 1000 MG tablet Take 1,000 mg by mouth 2 (two) times daily with a meal.     glimepiride  (AMARYL) 1 MG tablet Take 1 mg by mouth daily.     No current facility-administered medications for this visit.     Allergies  Allergen Reactions   Penicillins     unknown   REVIEW OF SYSTEMS Review of Systems negative except from HPI and PMH  PHYSICAL EXAM BP 128/74 (BP Location: Left Arm, Patient Position: Sitting, Cuff Size: Normal)    Pulse (!) 59    Ht 5\' 10"  (1.778 m)    Wt 166 lb (75.3 kg)    BMI 23.82 kg/m  Well developed and well nourished in no acute distress HENT normal Neck supple with JVP-flat Clear Device pocket well healed; without hematoma or erythema.  There is no tethering  Regular rate and rhythm, no  gallop No  murmur Abd-soft with active BS No Clubbing cyanosis  edema Skin-warm and dry A & Oriented  Grossly normal sensory and motor function   ECG:  Sinus @ 59 17/07/41   Assessment and Plan  Cryptogenic Stroke  Loop Recorder now at EOS   No intercurrent atrial fibrillation or flutter  Struggling wi aphasia and the issues of patience  We spent more than 50% of our >25 min visit in face to face counseling regarding the above 2  In addition, the loop recorder was explanted.  After routine prep and drape, informed consent, lidocaine was infiltrated at the line of the incision of the implant.  With modest challenges, the  loop recorder was explanted.  A Steri-Strip benzoin dressing was applied.  Instructions were given to the patient and his wife.  Otherwise stable   I have reviewed the above documentation for accuracy and completeness, and I agree with the above.    Signed, Sherryl Manges, MD 01/09/19 [facility] [place], [place] [phone]

## 2019-01-09 ENCOUNTER — Encounter: Payer: Self-pay | Admitting: Internal Medicine

## 2019-01-09 ENCOUNTER — Ambulatory Visit (INDEPENDENT_AMBULATORY_CARE_PROVIDER_SITE_OTHER): Payer: BLUE CROSS/BLUE SHIELD | Admitting: Internal Medicine

## 2019-01-09 VITALS — BP 128/74 | HR 59 | Ht 70.0 in | Wt 166.0 lb

## 2019-01-09 DIAGNOSIS — Z959 Presence of cardiac and vascular implant and graft, unspecified: Secondary | ICD-10-CM | POA: Diagnosis not present

## 2019-01-09 DIAGNOSIS — I639 Cerebral infarction, unspecified: Secondary | ICD-10-CM | POA: Diagnosis not present

## 2019-01-09 NOTE — Patient Instructions (Addendum)
Medication Instructions:  - Your physician recommends that you continue on your current medications as directed. Please refer to the Current Medication list given to you today.  If you need a refill on your cardiac medications before your next appointment, please call your pharmacy.   Lab work: - none ordered  If you have labs (blood work) drawn today and your tests are completely normal, you will receive your results only by: Marland Kitchen MyChart Message (if you have MyChart) OR . A paper copy in the mail If you have any lab test that is abnormal or we need to change your treatment, we will call you to review the results.  Testing/Procedures: - Your loop recorder was removed in the office today  Follow-Up: At [facility], you and your health needs are our priority.  As part of our continuing mission to provide you with exceptional heart care, we have created designated Provider Care Teams.  These Care Teams include your primary Cardiologist (physician) and Advanced Practice Providers (APPs -  Physician Assistants and Nurse Practitioners) who all work together to provide you with the care you need, when you need it. . Tuesday 01/16/19 with Dr. Graciela Husbands at 9:00 am  Any Other Special Instructions Will Be Listed Below (If Applicable).  - Please keep your bandage on for 3 days  - Once this comes off you will see 2 "strips" underneath, please leave these in place until you come back in the office for a wound check next week. If they fall off on their own it is ok

## 2019-01-15 DIAGNOSIS — H1131 Conjunctival hemorrhage, right eye: Secondary | ICD-10-CM | POA: Diagnosis not present

## 2019-01-16 ENCOUNTER — Ambulatory Visit: Payer: BLUE CROSS/BLUE SHIELD | Admitting: Internal Medicine

## 2019-01-16 DIAGNOSIS — Z959 Presence of cardiac and vascular implant and graft, unspecified: Secondary | ICD-10-CM

## 2019-01-16 NOTE — Patient Instructions (Signed)
Medication Instructions:  - Your physician recommends that you continue on your current medications as directed. Please refer to the Current Medication list given to you today.  If you need a refill on your cardiac medications before your next appointment, please call your pharmacy.   Lab work: - none ordered  If you have labs (blood work) drawn today and your tests are completely normal, you will receive your results only by: . MyChart Message (if you have MyChart) OR . A paper copy in the mail If you have any lab test that is abnormal or we need to change your treatment, we will call you to review the results.  Testing/Procedures: - none ordered  Follow-Up: At [facility], you and your health needs are our priority.  As part of our continuing mission to provide you with exceptional heart care, we have created designated Provider Care Teams.  These Care Teams include your primary Cardiologist (physician) and Advanced Practice Providers (APPs -  Physician Assistants and Nurse Practitioners) who all work together to provide you with the care you need, when you need it. . as needed with Dr. Klein.  Any Other Special Instructions Will Be Listed Below (If Applicable). - N/A   

## 2019-01-16 NOTE — Progress Notes (Signed)
Wound was evaluated.  Well healing follow-up PRN

## 2019-01-18 DIAGNOSIS — H1131 Conjunctival hemorrhage, right eye: Secondary | ICD-10-CM | POA: Diagnosis not present

## 2019-01-26 DIAGNOSIS — H1131 Conjunctival hemorrhage, right eye: Secondary | ICD-10-CM | POA: Diagnosis not present

## 2019-02-21 LAB — HEMOGLOBIN A1C: Hemoglobin A1C: 8.6

## 2019-03-12 DIAGNOSIS — Z794 Long term (current) use of insulin: Secondary | ICD-10-CM | POA: Diagnosis not present

## 2019-03-12 DIAGNOSIS — E785 Hyperlipidemia, unspecified: Secondary | ICD-10-CM | POA: Diagnosis not present

## 2019-03-12 DIAGNOSIS — E1165 Type 2 diabetes mellitus with hyperglycemia: Secondary | ICD-10-CM | POA: Diagnosis not present

## 2019-03-12 DIAGNOSIS — E1169 Type 2 diabetes mellitus with other specified complication: Secondary | ICD-10-CM | POA: Diagnosis not present

## 2019-04-06 ENCOUNTER — Other Ambulatory Visit: Payer: Self-pay | Admitting: Family Medicine

## 2019-04-06 DIAGNOSIS — I679 Cerebrovascular disease, unspecified: Secondary | ICD-10-CM

## 2019-04-06 DIAGNOSIS — E785 Hyperlipidemia, unspecified: Secondary | ICD-10-CM

## 2019-06-26 ENCOUNTER — Other Ambulatory Visit: Payer: Self-pay | Admitting: Family Medicine

## 2019-06-26 DIAGNOSIS — I679 Cerebrovascular disease, unspecified: Secondary | ICD-10-CM

## 2019-06-26 DIAGNOSIS — E785 Hyperlipidemia, unspecified: Secondary | ICD-10-CM

## 2019-07-06 ENCOUNTER — Other Ambulatory Visit: Payer: Self-pay

## 2019-07-06 ENCOUNTER — Ambulatory Visit: Payer: BC Managed Care – PPO | Admitting: Family Medicine

## 2019-07-06 ENCOUNTER — Encounter: Payer: Self-pay | Admitting: Family Medicine

## 2019-07-06 VITALS — BP 116/64 | HR 66 | Ht 70.0 in | Wt 153.0 lb

## 2019-07-06 DIAGNOSIS — I679 Cerebrovascular disease, unspecified: Secondary | ICD-10-CM | POA: Diagnosis not present

## 2019-07-06 DIAGNOSIS — E785 Hyperlipidemia, unspecified: Secondary | ICD-10-CM

## 2019-07-06 MED ORDER — CLOPIDOGREL BISULFATE 75 MG PO TABS: 75.0000 mg | ORAL_TABLET | Freq: Every day | ORAL | 1 refills | Status: DC

## 2019-07-06 MED ORDER — ATORVASTATIN CALCIUM 80 MG PO TABS: ORAL_TABLET | ORAL | 1 refills | Status: DC

## 2019-07-06 NOTE — Progress Notes (Signed)
Date:  07/06/2019   Name:  Maxwell OleaDavid G Wendel   DOB:  01/03/1956   MRN:  [id number]   Chief Complaint: Hyperlipidemia and Cerebrovascular Accident  Hyperlipidemia This is a chronic problem. The current episode started more than 1 year ago. The problem is controlled. Recent lipid tests were reviewed and are normal. Exacerbating diseases include diabetes. He has no history of chronic renal disease, hypothyroidism, liver disease or obesity. Pertinent negatives include no chest pain, focal sensory loss, focal weakness, leg pain, myalgias or shortness of breath. Current antihyperlipidemic treatment includes statins. The current treatment provides mild improvement of lipids. There are no compliance problems.   Cerebrovascular Accident This is a chronic problem. The current episode started more than 1 year ago. The problem occurs intermittently. The problem has been unchanged. Pertinent negatives include no abdominal pain, anorexia, arthralgias, change in bowel habit, chest pain, chills, congestion, coughing, fatigue, fever, headaches, joint swelling, myalgias, nausea, neck pain, numbness, rash, sore throat, swollen glands, urinary symptoms, vertigo, visual change or vomiting. Treatments tried: plavix. The treatment provided moderate relief.    Review of Systems  Constitutional: Negative for chills, fatigue and fever.  HENT: Negative for congestion, drooling, ear discharge, ear pain and sore throat.   Respiratory: Negative for cough, shortness of breath and wheezing.   Cardiovascular: Negative for chest pain, palpitations and leg swelling.  Gastrointestinal: Negative for abdominal pain, anorexia, blood in stool, change in bowel habit, constipation, diarrhea, nausea and vomiting.  Endocrine: Negative for polydipsia.  Genitourinary: Negative for dysuria, frequency, hematuria and urgency.  Musculoskeletal: Negative for arthralgias, back pain, joint swelling, myalgias and neck pain.  Skin: Negative for  rash.  Allergic/Immunologic: Negative for environmental allergies.  Neurological: Negative for dizziness, vertigo, focal weakness, numbness and headaches.  Hematological: Does not bruise/bleed easily.  Psychiatric/Behavioral: Negative for suicidal ideas. The patient is not nervous/anxious.     Patient Active Problem List   Diagnosis Date Noted  . Routine general medical examination at a health care facility 03/18/2015  . Type II diabetes mellitus with neurological manifestations (HCC) 03/18/2015  . Ischemic stroke (HCC) 03/18/2015  . Combined fat and carbohydrate induced hyperlipemia 03/18/2015  . Cerebral infarction due to embolism of left middle cerebral artery (HCC) 02/26/2015  . DM (diabetes mellitus) (HCC) 02/26/2015  . HLD (hyperlipidemia) 02/26/2015  . Dyslipidemia, goal LDL below 70 10/18/2014  . Acute ischemic stroke (HCC) 10/15/2014  . Aphasia due to stroke 10/15/2014  . Cerebral vascular disease 10/15/2014    Allergies  Allergen Reactions  . Penicillins     unknown    Past Surgical History:  Procedure Laterality Date  . LOOP RECORDER IMPLANT N/A 10/18/2014   Procedure: LOOP RECORDER IMPLANT;  Surgeon: Duke SalviaSteven C Klein, MD;  [facility];  Service: Cardiovascular;  Laterality: N/A;  . TEE WITHOUT CARDIOVERSION N/A 10/18/2014   Procedure: TRANSESOPHAGEAL ECHOCARDIOGRAM (TEE);  Surgeon: Thurmon FairMihai Croitoru, MD;  [facility];  Service: Cardiovascular;  Laterality: N/A;  loop recorder to follow     Social History   Tobacco Use  . Smoking status: Never Smoker  . Smokeless tobacco: Former NeurosurgeonUser    Types: Chew  . Tobacco comment: quit around 2012  Substance Use Topics  . Alcohol use: Yes    Alcohol/week: 1.0 standard drinks    Types: 1 Cans of beer per week    Comment: Occasional   . Drug use: No     Medication list has been reviewed and updated.  Current Meds  Medication  Sig  . atorvastatin (LIPITOR) 80 MG tablet TAKE 1 TABLET DAILY  .  clopidogrel (PLAVIX) 75 MG tablet TAKE 1 TABLET DAILY  . clotrimazole-betamethasone (LOTRISONE) cream Apply 1 application topically 2 (two) times daily.  . empagliflozin (JARDIANCE) 10 MG TABS tablet Take 10 mg by mouth daily. Dr Honor Junes  . glimepiride (AMARYL) 1 MG tablet Take 1 mg by mouth daily.  . Insulin Glargine-Lixisenatide 100-33 UNT-MCG/ML SOPN Inject 15 Units into the skin. Dr Honor Junes  . metFORMIN (GLUCOPHAGE) 1000 MG tablet Take 1,000 mg by mouth 2 (two) times daily with a meal.    PHQ 2/9 Scores 07/06/2019 11/02/2017 12/11/2015  PHQ - 2 Score 0 0 0  PHQ- 9 Score 0 0 -    BP Readings from Last 3 Encounters:  07/06/19 116/64  01/09/19 128/74  10/13/18 124/70    Physical Exam Vitals signs and nursing note reviewed.  HENT:     Head: Normocephalic.     Right Ear: Tympanic membrane, ear canal and external ear normal.     Left Ear: Tympanic membrane, ear canal and external ear normal.     Nose: Nose normal.  Eyes:     General: No scleral icterus.       Right eye: No discharge.        Left eye: No discharge.     Conjunctiva/sclera: Conjunctivae normal.     Pupils: Pupils are equal, round, and reactive to light.  Neck:     Musculoskeletal: Normal range of motion and neck supple.     Thyroid: No thyromegaly.     Vascular: No JVD.     Trachea: No tracheal deviation.  Cardiovascular:     Rate and Rhythm: Normal rate and regular rhythm.     Pulses: Normal pulses.     Heart sounds: Normal heart sounds. No murmur. No friction rub. No gallop.   Pulmonary:     Effort: No respiratory distress.     Breath sounds: Normal breath sounds. No wheezing or rales.  Abdominal:     General: Bowel sounds are normal.     Palpations: Abdomen is soft. There is no mass.     Tenderness: There is no abdominal tenderness. There is no guarding or rebound.  Musculoskeletal: Normal range of motion.        General: No tenderness.  Lymphadenopathy:     Cervical: No cervical adenopathy.  Skin:     General: Skin is warm.     Findings: No rash.  Neurological:     Mental Status: He is alert and oriented to person, place, and time.     Cranial Nerves: No cranial nerve deficit.     Deep Tendon Reflexes: Reflexes are normal and symmetric.     Wt Readings from Last 3 Encounters:  07/06/19 153 lb (69.4 kg)  01/09/19 166 lb (75.3 kg)  10/13/18 155 lb (70.3 kg)    BP 116/64   Pulse 66   Ht 5\' 10"  (1.778 m)   Wt 153 lb (69.4 kg)   SpO2 98%   BMI 21.95 kg/m   Assessment and Plan: 1. Cerebral vascular disease Patient with history of cerebrovascular disease which is under control by blood pressure maintenance, lipid control, and Plavix 75 mg once a day. - atorvastatin (LIPITOR) 80 MG tablet; TAKE 1 TABLET DAILY  Dispense: 90 tablet; Refill: 1 - clopidogrel (PLAVIX) 75 MG tablet; Take 1 tablet (75 mg total) by mouth daily.  Dispense: 90 tablet; Refill: 1  2. Dyslipidemia, goal LDL  below 70 Chronic.  Controlled.  Continue atorvastatin 80 mg once a day. - atorvastatin (LIPITOR) 80 MG tablet; TAKE 1 TABLET DAILY  Dispense: 90 tablet; Refill: 1 - clopidogrel (PLAVIX) 75 MG tablet; Take 1 tablet (75 mg total) by mouth daily.  Dispense: 90 tablet; Refill: 1

## 2019-07-17 DIAGNOSIS — E1165 Type 2 diabetes mellitus with hyperglycemia: Secondary | ICD-10-CM | POA: Diagnosis not present

## 2019-07-17 DIAGNOSIS — Z794 Long term (current) use of insulin: Secondary | ICD-10-CM | POA: Diagnosis not present

## 2019-07-17 DIAGNOSIS — E1169 Type 2 diabetes mellitus with other specified complication: Secondary | ICD-10-CM | POA: Diagnosis not present

## 2019-07-17 DIAGNOSIS — E785 Hyperlipidemia, unspecified: Secondary | ICD-10-CM | POA: Diagnosis not present

## 2019-08-02 ENCOUNTER — Other Ambulatory Visit: Payer: Self-pay | Admitting: Family Medicine

## 2019-08-02 DIAGNOSIS — I679 Cerebrovascular disease, unspecified: Secondary | ICD-10-CM

## 2019-08-02 DIAGNOSIS — E785 Hyperlipidemia, unspecified: Secondary | ICD-10-CM

## 2019-09-24 ENCOUNTER — Other Ambulatory Visit: Payer: Self-pay | Admitting: Family Medicine

## 2019-09-24 DIAGNOSIS — E785 Hyperlipidemia, unspecified: Secondary | ICD-10-CM

## 2019-09-24 DIAGNOSIS — I679 Cerebrovascular disease, unspecified: Secondary | ICD-10-CM

## 2019-11-26 DIAGNOSIS — Z794 Long term (current) use of insulin: Secondary | ICD-10-CM | POA: Diagnosis not present

## 2019-11-26 DIAGNOSIS — E785 Hyperlipidemia, unspecified: Secondary | ICD-10-CM | POA: Diagnosis not present

## 2019-11-26 DIAGNOSIS — E1169 Type 2 diabetes mellitus with other specified complication: Secondary | ICD-10-CM | POA: Diagnosis not present

## 2019-11-26 DIAGNOSIS — E1165 Type 2 diabetes mellitus with hyperglycemia: Secondary | ICD-10-CM | POA: Diagnosis not present

## 2019-11-26 LAB — HEMOGLOBIN A1C: Hemoglobin A1C: 10.4

## 2019-11-27 ENCOUNTER — Other Ambulatory Visit: Payer: Self-pay

## 2019-11-27 DIAGNOSIS — K130 Diseases of lips: Secondary | ICD-10-CM

## 2019-11-27 MED ORDER — CLOTRIMAZOLE-BETAMETHASONE 1-0.05 % EX CREA: 1.0000 "application " | TOPICAL_CREAM | Freq: Two times a day (BID) | CUTANEOUS | 0 refills | Status: DC

## 2019-11-27 NOTE — Progress Notes (Unsigned)
Sent in Clotrimazole

## 2019-11-30 DIAGNOSIS — E119 Type 2 diabetes mellitus without complications: Secondary | ICD-10-CM | POA: Diagnosis not present

## 2019-12-07 ENCOUNTER — Other Ambulatory Visit: Payer: Self-pay

## 2019-12-23 ENCOUNTER — Other Ambulatory Visit: Payer: Self-pay | Admitting: Family Medicine

## 2019-12-23 DIAGNOSIS — E785 Hyperlipidemia, unspecified: Secondary | ICD-10-CM

## 2019-12-23 DIAGNOSIS — I679 Cerebrovascular disease, unspecified: Secondary | ICD-10-CM

## 2019-12-24 ENCOUNTER — Ambulatory Visit (INDEPENDENT_AMBULATORY_CARE_PROVIDER_SITE_OTHER): Payer: BC Managed Care – PPO | Admitting: Family Medicine

## 2019-12-24 ENCOUNTER — Encounter: Payer: Self-pay | Admitting: Family Medicine

## 2019-12-24 ENCOUNTER — Other Ambulatory Visit: Payer: Self-pay

## 2019-12-24 VITALS — BP 120/76 | HR 80 | Ht 70.0 in | Wt 162.0 lb

## 2019-12-24 DIAGNOSIS — R69 Illness, unspecified: Secondary | ICD-10-CM

## 2019-12-24 DIAGNOSIS — Z23 Encounter for immunization: Secondary | ICD-10-CM | POA: Diagnosis not present

## 2019-12-24 DIAGNOSIS — E785 Hyperlipidemia, unspecified: Secondary | ICD-10-CM

## 2019-12-24 DIAGNOSIS — I679 Cerebrovascular disease, unspecified: Secondary | ICD-10-CM

## 2019-12-24 MED ORDER — CLOPIDOGREL BISULFATE 75 MG PO TABS: 75.0000 mg | ORAL_TABLET | Freq: Every day | ORAL | 1 refills | Status: DC

## 2019-12-24 MED ORDER — ATORVASTATIN CALCIUM 80 MG PO TABS: ORAL_TABLET | ORAL | 1 refills | Status: DC

## 2019-12-24 NOTE — Progress Notes (Signed)
Date:  12/24/2019   Name:  Maxwell Clark   DOB:  October 17, 1956   MRN:  [id number]   Chief Complaint: Hyperlipidemia, Cerebrovascular Accident, and Flu Vaccine  Hyperlipidemia This is a chronic problem. The current episode started more than 1 year ago. The problem is controlled. Recent lipid tests were reviewed and are normal. He has no history of chronic renal disease, diabetes, hypothyroidism, liver disease, obesity or nephrotic syndrome. Pertinent negatives include no chest pain, focal sensory loss, focal weakness, leg pain, myalgias or shortness of breath. Current antihyperlipidemic treatment includes statins. There are no compliance problems.   Cerebrovascular Accident This is a chronic problem. The current episode started more than 1 year ago. Episode frequency: stable. Pertinent negatives include no abdominal pain, anorexia, arthralgias, change in bowel habit, chest pain, chills, congestion, coughing, diaphoresis, fatigue, fever, headaches, joint swelling, myalgias, nausea, neck pain, numbness, rash, sore throat, swollen glands, urinary symptoms, vertigo, visual change, vomiting or weakness.    Lab Results  Component Value Date   CREATININE 0.93 12/11/2015   BUN 16 12/11/2015   NA 138 12/11/2015   K 5.1 12/11/2015   CL 100 12/11/2015   CO2 25 12/11/2015   Lab Results  Component Value Date   CHOL 132 10/13/2018   HDL 80 10/13/2018   LDLCALC 37 10/13/2018   TRIG 77 10/13/2018   CHOLHDL 1.7 10/13/2018   Lab Results  Component Value Date   TSH 6.210 (H) 10/15/2014   Lab Results  Component Value Date   HGBA1C 10.4 11/26/2019     Review of Systems  Constitutional: Negative for chills, diaphoresis, fatigue and fever.  HENT: Negative for congestion, drooling, ear discharge, ear pain and sore throat.   Respiratory: Negative for cough, shortness of breath and wheezing.   Cardiovascular: Negative for chest pain, palpitations and leg swelling.  Gastrointestinal: Negative for  abdominal pain, anorexia, blood in stool, change in bowel habit, constipation, diarrhea, nausea and vomiting.  Endocrine: Negative for polydipsia.  Genitourinary: Negative for dysuria, frequency, hematuria and urgency.  Musculoskeletal: Negative for arthralgias, back pain, joint swelling, myalgias and neck pain.  Skin: Negative for rash.  Allergic/Immunologic: Negative for environmental allergies.  Neurological: Negative for dizziness, vertigo, focal weakness, weakness, numbness and headaches.  Hematological: Does not bruise/bleed easily.  Psychiatric/Behavioral: Negative for suicidal ideas. The patient is not nervous/anxious.     Patient Active Problem List   Diagnosis Date Noted  . Routine general medical examination at a health care facility 03/18/2015  . Type II diabetes mellitus with neurological manifestations ([place]) 03/18/2015  . Ischemic stroke ([place]) 03/18/2015  . Combined fat and carbohydrate induced hyperlipemia 03/18/2015  . Cerebral infarction due to embolism of left middle cerebral artery ([place]) 02/26/2015  . DM (diabetes mellitus) ([place]) 02/26/2015  . HLD (hyperlipidemia) 02/26/2015  . Dyslipidemia, goal LDL below 70 10/18/2014  . Acute ischemic stroke ([place]) 10/15/2014  . Aphasia due to stroke 10/15/2014  . Cerebral vascular disease 10/15/2014    Allergies  Allergen Reactions  . Penicillins     unknown    Past Surgical History:  Procedure Laterality Date  . LOOP RECORDER IMPLANT N/A 10/18/2014   Procedure: LOOP RECORDER IMPLANT;  Surgeon: Deboraha Sprang, MD;  Location: [facility] CATH LAB;  Service: Cardiovascular;  Laterality: N/A;  . TEE WITHOUT CARDIOVERSION N/A 10/18/2014   Procedure: TRANSESOPHAGEAL ECHOCARDIOGRAM (TEE);  Surgeon: Sanda Klein, MD;  Location: [facility] ENDOSCOPY;  Service: Cardiovascular;  Laterality: N/A;  loop recorder to follow  Social History   Tobacco Use  . Smoking status: Never Smoker  . Smokeless tobacco: Former Neurosurgeon    Types: Chew  .  Tobacco comment: quit around 2012  Substance Use Topics  . Alcohol use: Yes    Alcohol/week: 1.0 standard drinks    Types: 1 Cans of beer per week    Comment: Occasional   . Drug use: No     Medication list has been reviewed and updated.  Current Meds  Medication Sig  . atorvastatin (LIPITOR) 80 MG tablet TAKE 1 TABLET DAILY  . clopidogrel (PLAVIX) 75 MG tablet Take 1 tablet (75 mg total) by mouth daily.  . clotrimazole-betamethasone (LOTRISONE) cream Apply 1 application topically 2 (two) times daily.  . empagliflozin (JARDIANCE) 10 MG TABS tablet Take 10 mg by mouth daily. Dr Gershon Crane  . glimepiride (AMARYL) 1 MG tablet Take 1 mg by mouth daily.  . Insulin Glargine-Lixisenatide 100-33 UNT-MCG/ML SOPN Inject 15 Units into the skin. Dr Gershon Crane  . metFORMIN (GLUCOPHAGE) 1000 MG tablet Take 1,000 mg by mouth 2 (two) times daily with a meal.    PHQ 2/9 Scores 12/24/2019 07/06/2019 11/02/2017 12/11/2015  PHQ - 2 Score 0 0 0 0  PHQ- 9 Score 0 0 0 -    BP Readings from Last 3 Encounters:  12/24/19 120/76  07/06/19 116/64  01/09/19 128/74    Physical Exam Vitals and nursing note reviewed.  HENT:     Head: Normocephalic.     Right Ear: External ear normal.     Left Ear: External ear normal.     Nose: Nose normal.  Eyes:     General: No scleral icterus.       Right eye: No discharge.        Left eye: No discharge.     Conjunctiva/sclera: Conjunctivae normal.     Pupils: Pupils are equal, round, and reactive to light.  Neck:     Thyroid: No thyromegaly.     Vascular: No JVD.     Trachea: No tracheal deviation.  Cardiovascular:     Rate and Rhythm: Normal rate and regular rhythm.     Heart sounds: Normal heart sounds. No murmur. No friction rub. No gallop.   Pulmonary:     Effort: No respiratory distress.     Breath sounds: Normal breath sounds. No wheezing or rales.  Abdominal:     General: Bowel sounds are normal.     Palpations: Abdomen is soft. There is no mass.      Tenderness: There is no abdominal tenderness. There is no guarding or rebound.  Musculoskeletal:        General: No tenderness. Normal range of motion.     Cervical back: Normal range of motion and neck supple.  Lymphadenopathy:     Cervical: No cervical adenopathy.  Skin:    General: Skin is warm.     Findings: No rash.  Neurological:     Mental Status: He is alert and oriented to person, place, and time.     Cranial Nerves: No cranial nerve deficit.     Deep Tendon Reflexes: Reflexes are normal and symmetric.     Wt Readings from Last 3 Encounters:  12/24/19 162 lb (73.5 kg)  07/06/19 153 lb (69.4 kg)  01/09/19 166 lb (75.3 kg)    BP 120/76   Pulse 80   Ht 5\' 10"  (1.778 m)   Wt 162 lb (73.5 kg)   BMI 23.24 kg/m   Assessment and  Plan:  1. Cerebral vascular disease Chronic.  Controlled.  Stable.  Continue Plavix 75 mg once a day daily and atorvastatin 80 mg once a day. - clopidogrel (PLAVIX) 75 MG tablet; Take 1 tablet (75 mg total) by mouth daily.  Dispense: 90 tablet; Refill: 1 - atorvastatin (LIPITOR) 80 MG tablet; TAKE 1 TABLET DAILY  Dispense: 90 tablet; Refill: 1  2. Dyslipidemia, goal LDL below 70 Chronic.  Controlled.  Stable.  Continue atorvastatin 80 mg once a day.  Reviewed previous lipid panel. - atorvastatin (LIPITOR) 80 MG tablet; TAKE 1 TABLET DAILY  Dispense: 90 tablet; Refill: 1  3. Taking multiple medications for chronic disease Patient has medications that have side effects including hepatic concerns for the statin which we will check with BMP and platelet concerns for the Plavix and we are checking a CBC. - Basic metabolic panel - CBC  4. Influenza vaccine needed Discussed and administered - Flu Vaccine QUAD 6+ mos PF IM (Fluarix Quad PF)

## 2019-12-25 LAB — BASIC METABOLIC PANEL
BUN/Creatinine Ratio: 20 (ref 10–24)
BUN: 16 mg/dL (ref 8–27)
CO2: 22 mmol/L (ref 20–29)
Calcium: 9.7 mg/dL (ref 8.6–10.2)
Chloride: 102 mmol/L (ref 96–106)
Creatinine, Ser: 0.82 mg/dL (ref 0.76–1.27)
GFR calc Af Amer: 109 mL/min/{1.73_m2} (ref >59)
GFR calc non Af Amer: 94 mL/min/{1.73_m2} (ref >59)
Glucose: 84 mg/dL (ref 65–99)
Potassium: 4.2 mmol/L (ref 3.5–5.2)
Sodium: 138 mmol/L (ref 134–144)

## 2019-12-25 LAB — CBC
Hematocrit: 45 % (ref 37.5–51.0)
Hemoglobin: 16 g/dL (ref 13.0–17.7)
MCH: 32.5 pg (ref 26.6–33.0)
MCHC: 35.6 g/dL (ref 31.5–35.7)
MCV: 91 fL (ref 79–97)
Platelets: 249 10*3/uL (ref 150–450)
RBC: 4.93 x10E6/uL (ref 4.14–5.80)
RDW: 12.7 % (ref 11.6–15.4)
WBC: 6.9 10*3/uL (ref 3.4–10.8)

## 2020-03-03 DIAGNOSIS — E1169 Type 2 diabetes mellitus with other specified complication: Secondary | ICD-10-CM | POA: Diagnosis not present

## 2020-03-03 DIAGNOSIS — E785 Hyperlipidemia, unspecified: Secondary | ICD-10-CM | POA: Diagnosis not present

## 2020-03-03 DIAGNOSIS — Z794 Long term (current) use of insulin: Secondary | ICD-10-CM | POA: Diagnosis not present

## 2020-03-03 DIAGNOSIS — E1165 Type 2 diabetes mellitus with hyperglycemia: Secondary | ICD-10-CM | POA: Diagnosis not present

## 2020-04-19 ENCOUNTER — Other Ambulatory Visit: Payer: Self-pay | Admitting: Family Medicine

## 2020-04-19 DIAGNOSIS — K130 Diseases of lips: Secondary | ICD-10-CM

## 2020-04-19 DIAGNOSIS — I679 Cerebrovascular disease, unspecified: Secondary | ICD-10-CM

## 2020-04-19 NOTE — Telephone Encounter (Signed)
Requested medication (s) are due for refill today: yes  Requested medication (s) are on the active medication list: yes  Last refill:  11/27/19  Future visit scheduled: no  Notes to clinic:  not assigned to protocol    Requested Prescriptions  Pending Prescriptions Disp Refills   clotrimazole-betamethasone (LOTRISONE) cream [Pharmacy Med Name: CLOTRIMAZOLE-BETAMETHASONE CRM] 30 g 0    Sig: APPLY TO AFFECTED AREA TWICE A DAY      Off-Protocol Failed - 04/19/2020  2:57 PM      Failed - Medication not assigned to a protocol, review manually.      Passed - Valid encounter within last 12 months    Recent Outpatient Visits           3 months ago Dyslipidemia, goal LDL below 70   [facility] Duanne Limerick, MD   9 months ago Cerebral vascular disease   [facility] Duanne Limerick, MD   1 year ago Cerebral vascular disease   [facility] Duanne Limerick, MD   2 years ago Cerebral vascular disease   [facility] Duanne Limerick, MD   2 years ago Rhomboid muscle strain, initial encounter   [facility] Duanne Limerick, MD

## 2020-04-19 NOTE — Telephone Encounter (Signed)
Requested Prescriptions  Pending Prescriptions Disp Refills  . clopidogrel (PLAVIX) 75 MG tablet [Pharmacy Med Name: CLOPIDOGREL 75 MG TABLET] 90 tablet 0    Sig: TAKE 1 TABLET BY MOUTH EVERY DAY     Hematology: Antiplatelets - clopidogrel Failed - 04/19/2020  9:22 AM      Failed - Evaluate AST, ALT within 2 months of therapy initiation.      Failed - ALT in normal range and within 360 days    ALT  Date Value Ref Range Status  10/15/2014 21 0 - 53 U/L Final   SGPT (ALT)  Date Value Ref Range Status  10/15/2014 34 U/L Final    Comment:    14-63 NOTE: New Reference Range 06/11/14          Failed - AST in normal range and within 360 days    AST  Date Value Ref Range Status  10/15/2014 21 0 - 37 U/L Final   SGOT(AST)  Date Value Ref Range Status  10/15/2014 27 15 - 37 Unit/L Final         Passed - HCT in normal range and within 180 days    Hematocrit  Date Value Ref Range Status  12/24/2019 45.0 37.5 - 51.0 % Final         Passed - HGB in normal range and within 180 days    Hemoglobin  Date Value Ref Range Status  12/24/2019 16.0 13.0 - 17.7 g/dL Final         Passed - PLT in normal range and within 180 days    Platelets  Date Value Ref Range Status  12/24/2019 249 150 - 450 x10E3/uL Final         Passed - Valid encounter within last 6 months    Recent Outpatient Visits          3 months ago Dyslipidemia, goal LDL below 70   [facility] Duanne Limerick, MD   9 months ago Cerebral vascular disease   [facility] Duanne Limerick, MD   1 year ago Cerebral vascular disease   [facility] Duanne Limerick, MD   2 years ago Cerebral vascular disease   [facility] Duanne Limerick, MD   2 years ago Rhomboid muscle strain, initial encounter   [facility] Duanne Limerick, MD

## 2020-04-25 ENCOUNTER — Other Ambulatory Visit: Payer: Self-pay

## 2020-04-25 ENCOUNTER — Telehealth: Payer: Self-pay | Admitting: Family Medicine

## 2020-04-25 DIAGNOSIS — Z1212 Encounter for screening for malignant neoplasm of rectum: Secondary | ICD-10-CM

## 2020-04-25 NOTE — Telephone Encounter (Signed)
Copied from CRM [id number]. Topic: General - Other >> Apr 25, 2020 11:40 AM Elliot Gault wrote: Spouse states she called in last week requesting preventive colonoscopy orders, spouse would like a follow up call today regarding where orders will be placed, spouse states patient is overdue.

## 2020-04-25 NOTE — Telephone Encounter (Signed)
Called and spoke to spouse Nicki Guadalajara) told her that we didn't have a referral placed asked if it was ok for a RF be placed with Dr. Smith Mince. Pt agreed that it was ok. Will place referral.   KP

## 2020-05-01 ENCOUNTER — Telehealth (INDEPENDENT_AMBULATORY_CARE_PROVIDER_SITE_OTHER): Payer: Self-pay | Admitting: Gastroenterology

## 2020-05-01 ENCOUNTER — Other Ambulatory Visit: Payer: Self-pay

## 2020-05-01 DIAGNOSIS — J329 Chronic sinusitis, unspecified: Secondary | ICD-10-CM | POA: Insufficient documentation

## 2020-05-01 DIAGNOSIS — Z1211 Encounter for screening for malignant neoplasm of colon: Secondary | ICD-10-CM

## 2020-05-01 MED ORDER — NA SULFATE-K SULFATE-MG SULF 17.5-3.13-1.6 GM/177ML PO SOLN: 1.0000 | Freq: Once | ORAL | 0 refills | Status: AC

## 2020-05-01 NOTE — Progress Notes (Signed)
Gastroenterology Pre-Procedure Review  Request Date: Friday 05/16/20 Requesting Physician: Dr. Vicente Males  PATIENT REVIEW QUESTIONS: The patient responded to the following health history questions as indicated:    1. Are you having any GI issues? no 2. Do you have a personal history of Polyps? no 3. Do you have a family history of Colon Cancer or Polyps?no 4. Diabetes Mellitus? yes (type 2) 5. Joint replacements in the past 12 months?no 6. Major health problems in the past 3 months?no 7. Any artificial heart valves, MVP, or defibrillator?no    MEDICATIONS & ALLERGIES:    Patient reports the following regarding taking any anticoagulation/antiplatelet therapy:   Plavix, Coumadin, Eliquis, Xarelto, Lovenox, Pradaxa, Brilinta, or Effient? yes (Plavix blood thinner sent to Dr. Ronnald Ramp office) Aspirin? no  Patient confirms/reports the following medications:  Current Outpatient Medications  Medication Sig Dispense Refill   atorvastatin (LIPITOR) 80 MG tablet TAKE 1 TABLET DAILY 90 tablet 3   clopidogrel (PLAVIX) 75 MG tablet TAKE 1 TABLET BY MOUTH EVERY DAY 90 tablet 0   Continuous Blood Gluc Sensor (FREESTYLE LIBRE 2 SENSOR) MISC Use 1 kit every 14 (fourteen) days for glucose monitoring     Continuous Blood Gluc Sensor (FREESTYLE LIBRE 2 SENSOR) MISC      empagliflozin (JARDIANCE) 10 MG TABS tablet Take 10 mg by mouth daily. Dr Honor Junes     Insulin Glargine-Lixisenatide 100-33 UNT-MCG/ML SOPN Inject 15 Units into the skin. Dr Honor Junes     Insulin Pen Needle (B-D ULTRAFINE III SHORT PEN) 31G X 8 MM MISC USE ONCE DAILY     metFORMIN (GLUCOPHAGE) 1000 MG tablet Take 1,000 mg by mouth 2 (two) times daily with a meal.     B-D ULTRAFINE III SHORT PEN 31G X 8 MM MISC Inject 1 Syringe into the skin daily.     clotrimazole-betamethasone (LOTRISONE) cream APPLY TO AFFECTED AREA TWICE A DAY (Patient not taking: Reported on 05/01/2020) 30 g 0   glimepiride (AMARYL) 1 MG tablet Take 4 mg by mouth  daily.      No current facility-administered medications for this visit.    Patient confirms/reports the following allergies:  Allergies  Allergen Reactions   Penicillins     unknown    No orders of the defined types were placed in this encounter.   AUTHORIZATION INFORMATION Primary Insurance: 1D#: Group #:  Secondary Insurance: 1D#: Group #:  SCHEDULE INFORMATION: Date: Friday 05/16/20 Time: Location:[facility]

## 2020-05-02 ENCOUNTER — Telehealth: Payer: Self-pay

## 2020-05-02 NOTE — Telephone Encounter (Signed)
Patients wife has been advised to have her husband stop Plavix 5 days prior to Colonoscopy procedure per blood thinner advice received by Dr. Yetta Barre.  Thanks,  [place], [place]

## 2020-05-13 ENCOUNTER — Other Ambulatory Visit: Payer: Self-pay | Admitting: Family Medicine

## 2020-05-13 DIAGNOSIS — K130 Diseases of lips: Secondary | ICD-10-CM

## 2020-05-13 NOTE — Telephone Encounter (Signed)
Requested medication (s) are due for refill today:yes  Requested medication (s) are on the active medication list: yes  Last refill: 04/19/20  Future visit scheduled: No  Notes to clinic:  off protocol  last OV 12/24/19    Requested Prescriptions  Pending Prescriptions Disp Refills   clotrimazole-betamethasone (LOTRISONE) cream [Pharmacy Med Name: CLOTRIMAZOLE-BETAMETHASONE CRM] 30 g 0    Sig: APPLY TO AFFECTED AREA TWICE A DAY      Off-Protocol Failed - 05/13/2020  9:47 AM      Failed - Medication not assigned to a protocol, review manually.      Passed - Valid encounter within last 12 months    Recent Outpatient Visits           4 months ago Dyslipidemia, goal LDL below 70   [facility] Duanne Limerick, MD   10 months ago Cerebral vascular disease   [facility] Duanne Limerick, MD   1 year ago Cerebral vascular disease   [facility] Duanne Limerick, MD   2 years ago Cerebral vascular disease   [facility] Duanne Limerick, MD   2 years ago Rhomboid muscle strain, initial encounter   [facility] Duanne Limerick, MD

## 2020-05-14 ENCOUNTER — Other Ambulatory Visit: Payer: Self-pay

## 2020-05-14 ENCOUNTER — Other Ambulatory Visit
Admission: RE | Admit: 2020-05-14 | Discharge: 2020-05-14 | Disposition: A | Discharge status: Home / Self Care | Payer: BC Managed Care – PPO | Source: Ambulatory Visit | Attending: Gastroenterology | Admitting: Gastroenterology

## 2020-05-14 DIAGNOSIS — Z20822 Contact with and (suspected) exposure to covid-19: Secondary | ICD-10-CM | POA: Diagnosis not present

## 2020-05-14 DIAGNOSIS — Z01812 Encounter for preprocedural laboratory examination: Secondary | ICD-10-CM | POA: Insufficient documentation

## 2020-05-14 LAB — SARS CORONAVIRUS 2 (TAT 6-24 HRS): SARS Coronavirus 2: NEGATIVE

## 2020-05-15 ENCOUNTER — Encounter: Payer: Self-pay | Admitting: Gastroenterology

## 2020-05-16 ENCOUNTER — Encounter: Payer: Self-pay | Admitting: Gastroenterology

## 2020-05-16 ENCOUNTER — Ambulatory Visit: Payer: BC Managed Care – PPO | Admitting: Anesthesiology

## 2020-05-16 ENCOUNTER — Encounter
Admission: RE | Disposition: A | Discharge status: Home / Self Care | Payer: Self-pay | Source: Home / Self Care | Attending: Gastroenterology

## 2020-05-16 ENCOUNTER — Ambulatory Visit
Admission: RE | Admit: 2020-05-16 | Discharge: 2020-05-16 | Disposition: A | Discharge status: Home / Self Care | Payer: BC Managed Care – PPO | Attending: Gastroenterology | Admitting: Gastroenterology

## 2020-05-16 DIAGNOSIS — E785 Hyperlipidemia, unspecified: Secondary | ICD-10-CM | POA: Diagnosis not present

## 2020-05-16 DIAGNOSIS — Z1211 Encounter for screening for malignant neoplasm of colon: Secondary | ICD-10-CM | POA: Diagnosis not present

## 2020-05-16 DIAGNOSIS — Z87891 Personal history of nicotine dependence: Secondary | ICD-10-CM | POA: Diagnosis not present

## 2020-05-16 DIAGNOSIS — Z79899 Other long term (current) drug therapy: Secondary | ICD-10-CM | POA: Insufficient documentation

## 2020-05-16 DIAGNOSIS — E119 Type 2 diabetes mellitus without complications: Secondary | ICD-10-CM | POA: Diagnosis not present

## 2020-05-16 DIAGNOSIS — Z794 Long term (current) use of insulin: Secondary | ICD-10-CM | POA: Insufficient documentation

## 2020-05-16 DIAGNOSIS — Z8249 Family history of ischemic heart disease and other diseases of the circulatory system: Secondary | ICD-10-CM | POA: Diagnosis not present

## 2020-05-16 DIAGNOSIS — Z8673 Personal history of transient ischemic attack (TIA), and cerebral infarction without residual deficits: Secondary | ICD-10-CM | POA: Insufficient documentation

## 2020-05-16 HISTORY — PX: COLONOSCOPY WITH PROPOFOL: SHX5780

## 2020-05-16 SURGERY — COLONOSCOPY WITH PROPOFOL
Anesthesia: General

## 2020-05-16 MED ORDER — SODIUM CHLORIDE 0.9 % IV SOLN
INTRAVENOUS | Status: DC
  Administered 2020-05-16: 20 mL/h via INTRAVENOUS

## 2020-05-16 MED ORDER — PROPOFOL 10 MG/ML IV BOLUS
INTRAVENOUS | Status: DC | PRN
  Administered 2020-05-16: 150 ug/kg/min via INTRAVENOUS

## 2020-05-16 MED ORDER — PROPOFOL 10 MG/ML IV BOLUS
INTRAVENOUS | Status: AC
  Filled 2020-05-16: qty 20

## 2020-05-16 MED ORDER — EPHEDRINE SULFATE 50 MG/ML IJ SOLN
INTRAMUSCULAR | Status: DC | PRN
  Administered 2020-05-16: 10 mg via INTRAVENOUS

## 2020-05-16 MED ORDER — LIDOCAINE HCL (CARDIAC) PF 100 MG/5ML IV SOSY
PREFILLED_SYRINGE | INTRAVENOUS | Status: DC | PRN
  Administered 2020-05-16: 50 mg via INTRAVENOUS

## 2020-05-16 NOTE — H&P (Signed)
Jonathon Bellows, MD [address] [place], [place], [place], [address] [address], [place], [place], [place], [address] Phone: [phone]  Fax: [phone]  Primary Care Physician:  Juline Patch, MD   Pre-Procedure History & Physical: HPI:  Maxwell Clark is a 64 y.o. male is here for an colonoscopy.   Past Medical History:  Diagnosis Date  . Diabetes mellitus without complication ([place])   . Hyperlipidemia   . Stroke [facility]) 10/15/2014    Past Surgical History:  Procedure Laterality Date  . LOOP RECORDER IMPLANT N/A 10/18/2014   Procedure: LOOP RECORDER IMPLANT;  Surgeon: Deboraha Sprang, MD;  Location: [facility] CATH LAB;  Service: Cardiovascular;  Laterality: N/A;  . TEE WITHOUT CARDIOVERSION N/A 10/18/2014   Procedure: TRANSESOPHAGEAL ECHOCARDIOGRAM (TEE);  Surgeon: Sanda Klein, MD;  Location: [facility] ENDOSCOPY;  Service: Cardiovascular;  Laterality: N/A;  loop recorder to follow     Prior to Admission medications   Medication Sig Start Date End Date Taking? Authorizing Provider  atorvastatin (LIPITOR) 80 MG tablet TAKE 1 TABLET DAILY 12/25/19  Yes Juline Patch, MD  B-D ULTRAFINE III SHORT PEN 31G X 8 MM MISC Inject 1 Syringe into the skin daily. 03/20/20  Yes [provider]  clopidogrel (PLAVIX) 75 MG tablet TAKE 1 TABLET BY MOUTH EVERY DAY 04/19/20  Yes Juline Patch, MD  clotrimazole-betamethasone (LOTRISONE) cream APPLY TO AFFECTED AREA TWICE A DAY 05/13/20  Yes Juline Patch, MD  Continuous Blood Gluc Sensor (FREESTYLE LIBRE 2 SENSOR) MISC Use 1 kit every 14 (fourteen) days for glucose monitoring 03/21/20  Yes [provider]  Continuous Blood Gluc Sensor (FREESTYLE LIBRE 2 SENSOR) MISC  04/19/20  Yes [provider]  empagliflozin (JARDIANCE) 10 MG TABS tablet Take 10 mg by mouth daily. Dr Honor Junes 12/28/16  Yes [provider]  glimepiride (AMARYL) 2 MG tablet Take 2 mg by mouth daily. 02/06/20  Yes [provider]  Insulin  Glargine-Lixisenatide 100-33 UNT-MCG/ML SOPN Inject 15 Units into the skin. Dr Honor Junes 08/18/16  Yes [provider]  Insulin Pen Needle (B-D ULTRAFINE III SHORT PEN) 31G X 8 MM MISC USE ONCE DAILY 03/20/20  Yes [provider]  metFORMIN (GLUCOPHAGE) 1000 MG tablet Take 1,000 mg by mouth 2 (two) times daily with a meal.   Yes [provider]    Allergies as of 05/01/2020 - Review Complete 05/01/2020  Allergen Reaction Noted  . Penicillins  10/15/2014    Family History  Problem Relation Age of Onset  . Atrial fibrillation Mother   . Hypertension Mother   . Lung cancer Father   . Kidney cancer Father     Social History   Socioeconomic History  . Marital status: Married    Spouse name: Larena Glassman  . Number of children: 2  . Years of education: HS  . Highest education level: Not on file  Occupational History    Employer: OTHER    Comment: Self-employed landscaper  Tobacco Use  . Smoking status: Never Smoker  . Smokeless tobacco: Former Systems developer    Types: Chew  . Tobacco comment: quit around 2012  Substance and Sexual Activity  . Alcohol use: Yes    Alcohol/week: 1.0 standard drink    Types: 1 Cans of beer per week    Comment: Occasional   . Drug use: No  . Sexual activity: Yes  Other Topics Concern  . Not on file  Social History Narrative   Patient lives at  home with spouse.   Caffeine Use: 10 cups daily   Social Determinants of Health   Financial Resource Strain:   . Difficulty of Paying Living Expenses:   Food Insecurity:   . Worried About Charity fundraiser in the Last Year:   . Arboriculturist in the Last Year:   Transportation Needs:   . Film/video editor (Medical):   Marland Kitchen Lack of Transportation (Non-Medical):   Physical Activity:   . Days of Exercise per Week:   . Minutes of Exercise per Session:   Stress:   . Feeling of Stress :   Social Connections:   . Frequency of Communication with Friends and Family:   . Frequency of  Social Gatherings with Friends and Family:   . Attends Religious Services:   . Active Member of Clubs or Organizations:   . Attends Archivist Meetings:   Marland Kitchen Marital Status:   Intimate Partner Violence:   . Fear of Current or Ex-Partner:   . Emotionally Abused:   Marland Kitchen Physically Abused:   . Sexually Abused:     Review of Systems: See HPI, otherwise negative ROS  Physical Exam: BP 122/79   Pulse (!) 58   Temp (!) 97.3 F (36.3 C) (Temporal)   Resp 20   Ht '5\' 10"'  (1.778 m)   Wt 68 kg   SpO2 100%   BMI 21.52 kg/m  General:   Alert,  pleasant and cooperative in NAD Head:  Normocephalic and atraumatic. Neck:  Supple; no masses or thyromegaly. Lungs:  Clear throughout to auscultation, normal respiratory effort.    Heart:  +S1, +S2, Regular rate and rhythm, No edema. Abdomen:  Soft, nontender and nondistended. Normal bowel sounds, without guarding, and without rebound.   Neurologic:  Alert and  oriented x4;  grossly normal neurologically.  Impression/Plan: Maxwell Clark is here for an colonoscopy to be performed for Screening colonoscopy average risk  . He has not stopped his plavix and is aware that if there are large polyps then it would not be taken back and he would have to return for a repeat procedure off Plavix.   Risks, benefits, limitations, and alternatives regarding  colonoscopy have been reviewed with the patient.  Questions have been answered.  All parties agreeable.   Jonathon Bellows, MD  05/16/2020, 11:26 AM

## 2020-05-16 NOTE — Transfer of Care (Signed)
Immediate Anesthesia Transfer of Care Note  Patient: Maxwell Clark  Procedure(s) Performed: COLONOSCOPY WITH PROPOFOL (N/A )  Patient Location: PACU  Anesthesia Type:General  Level of Consciousness: drowsy  Airway & Oxygen Therapy: Patient Spontanous Breathing  Post-op Assessment: Report given to RN and Post -op Vital signs reviewed and stable  Post vital signs: stable  Last Vitals:  Vitals Value Taken Time  BP    Temp    Pulse 56 05/16/20 1156  Resp 14 05/16/20 1156  SpO2 99 % 05/16/20 1156  Vitals shown include unvalidated device data.  Last Pain:  Vitals:   05/16/20 1027  TempSrc: Temporal  PainSc: 0-No pain         Complications: No complications documented.

## 2020-05-16 NOTE — Op Note (Signed)
[facility] Gastroenterology Patient Name: Maxwell Clark Procedure Date: 05/16/2020 11:28 AM MRN: [id number] Account #: [id number] Date of Birth: 06-20-1956 Admit Type: Outpatient Age: 64 Room: [facility] ENDO ROOM 2 Gender: Male Note Status: Finalized Procedure:             Colonoscopy Indications:           Screening for colorectal malignant neoplasm Providers:             Wyline Mood MD, MD Referring MD:          Duanne Limerick, MD (Referring MD) Medicines:             Propofol per Anesthesia, Monitored Anesthesia Care Complications:         No immediate complications. Procedure:             Pre-Anesthesia Assessment:                        - Prior to the procedure, a History and Physical was                         performed, and patient medications, allergies and                         sensitivities were reviewed. The patient's tolerance                         of previous anesthesia was reviewed.                        - The risks and benefits of the procedure and the                         sedation options and risks were discussed with the                         patient. All questions were answered and informed                         consent was obtained.                        - ASA Grade Assessment: II - A patient with mild                         systemic disease.                        After obtaining informed consent, the colonoscope was                         passed under direct vision. Throughout the procedure,                         the patient's blood pressure, pulse, and oxygen                         saturations were monitored continuously. The                         Colonoscope was introduced  through the anus and                         advanced to the the cecum, identified by the                         appendiceal orifice. The colonoscopy was performed                         with ease. The patient tolerated the procedure well.                          The quality of the bowel preparation was excellent. Findings:      The entire examined colon appeared normal on direct and retroflexion       views. Impression:            - The entire examined colon is normal on direct and                         retroflexion views.                        - No specimens collected. Recommendation:        - Discharge patient to home (with escort).                        - Resume previous diet.                        - Continue present medications.                        - Repeat colonoscopy in 10 years for screening                         purposes. Procedure Code(s):     --- Professional ---                        [phone], Colonoscopy, flexible; diagnostic, including                         collection of specimen(s) by brushing or washing, when                         performed (separate procedure) Diagnosis Code(s):     --- Professional ---                        Z12.11, Encounter for screening for malignant neoplasm                         of colon CPT copyright 2019 American [employer] Association. All rights reserved. The codes documented in this report are preliminary and upon coder review may  be revised to meet current compliance requirements. Jonathon Bellows, MD Jonathon Bellows MD, MD 05/16/2020 11:52:50 AM This report has been signed electronically. Number of Addenda: 0 Note Initiated On: 05/16/2020 11:28 AM Scope Withdrawal Time: 0 hours 10 minutes 40 seconds  Total Procedure Duration: 0 hours 15 minutes 20 seconds  Estimated Blood Loss:  Estimated blood loss: none.  [facility]

## 2020-05-16 NOTE — Anesthesia Preprocedure Evaluation (Signed)
Anesthesia Evaluation  Patient identified by MRN, date of birth, ID band Patient awake    Reviewed: Allergy & Precautions, H&P , NPO status , Patient's Chart, lab work & pertinent test results, reviewed documented beta blocker date and time   History of Anesthesia Complications Negative for: history of anesthetic complications  Airway Mallampati: I  TM Distance: >3 FB Neck ROM: full    Dental  (+) Caps, Dental Advidsory Given, Missing   Pulmonary neg pulmonary ROS,    Pulmonary exam normal breath sounds clear to auscultation       Cardiovascular Exercise Tolerance: Good negative cardio ROS Normal cardiovascular exam Rhythm:regular Rate:Normal     Neuro/Psych neg Seizures CVA negative psych ROS   GI/Hepatic negative GI ROS, Neg liver ROS,   Endo/Other  diabetes  Renal/GU negative Renal ROS  negative genitourinary   Musculoskeletal   Abdominal   Peds  Hematology negative hematology ROS (+)   Anesthesia Other Findings Past Medical History: No date: Diabetes mellitus without complication (HCC) No date: Hyperlipidemia 10/15/2014: Stroke (HCC)   Reproductive/Obstetrics negative OB ROS                             Anesthesia Physical Anesthesia Plan  ASA: II  Anesthesia Plan: General   Post-op Pain Management:    Induction: Intravenous  PONV Risk Score and Plan: Propofol infusion and TIVA  Airway Management Planned: Natural Airway and Nasal Cannula  Additional Equipment:   Intra-op Plan:   Post-operative Plan:   Informed Consent: I have reviewed the patients History and Physical, chart, labs and discussed the procedure including the risks, benefits and alternatives for the proposed anesthesia with the patient or authorized representative who has indicated his/her understanding and acceptance.     Dental Advisory Given  Plan Discussed with: Anesthesiologist, CRNA and  Surgeon  Anesthesia Plan Comments:         Anesthesia Quick Evaluation

## 2020-05-19 ENCOUNTER — Encounter: Payer: Self-pay | Admitting: Gastroenterology

## 2020-05-19 NOTE — Anesthesia Postprocedure Evaluation (Signed)
Anesthesia Post Note  Patient: Maxwell Clark  Procedure(s) Performed: COLONOSCOPY WITH PROPOFOL (N/A )  Patient location during evaluation: Endoscopy Anesthesia Type: General Level of consciousness: awake and alert Pain management: pain level controlled Vital Signs Assessment: post-procedure vital signs reviewed and stable Respiratory status: spontaneous breathing, nonlabored ventilation, respiratory function stable and patient connected to nasal cannula oxygen Cardiovascular status: blood pressure returned to baseline and stable Postop Assessment: no apparent nausea or vomiting Anesthetic complications: no   No complications documented.   Last Vitals:  Vitals:   05/16/20 1216 05/16/20 1226  BP: 120/71 121/71  Pulse:    Resp:    Temp:    SpO2:      Last Pain:  Vitals:   05/17/20 1221  TempSrc:   PainSc: 0-No pain                 Lenard Simmer

## 2020-06-06 DIAGNOSIS — E1169 Type 2 diabetes mellitus with other specified complication: Secondary | ICD-10-CM | POA: Diagnosis not present

## 2020-06-06 DIAGNOSIS — Z794 Long term (current) use of insulin: Secondary | ICD-10-CM | POA: Diagnosis not present

## 2020-06-06 DIAGNOSIS — E785 Hyperlipidemia, unspecified: Secondary | ICD-10-CM | POA: Diagnosis not present

## 2020-06-06 DIAGNOSIS — E1165 Type 2 diabetes mellitus with hyperglycemia: Secondary | ICD-10-CM | POA: Diagnosis not present

## 2020-06-06 LAB — HEMOGLOBIN A1C: Hemoglobin A1C: 7.5

## 2020-06-21 ENCOUNTER — Other Ambulatory Visit: Payer: Self-pay | Admitting: Family Medicine

## 2020-06-21 DIAGNOSIS — E785 Hyperlipidemia, unspecified: Secondary | ICD-10-CM

## 2020-06-21 DIAGNOSIS — I679 Cerebrovascular disease, unspecified: Secondary | ICD-10-CM

## 2020-06-21 NOTE — Telephone Encounter (Signed)
Requested  medications are  due for refill today yes  Requested medications are on the active medication list yes  Last refill 5/2  Last visit 12/23/2018   Notes to clinic Failed protocol due to labs dated 09/2018

## 2020-09-18 ENCOUNTER — Other Ambulatory Visit: Payer: Self-pay | Admitting: Family Medicine

## 2020-09-18 DIAGNOSIS — I679 Cerebrovascular disease, unspecified: Secondary | ICD-10-CM

## 2020-09-18 DIAGNOSIS — E785 Hyperlipidemia, unspecified: Secondary | ICD-10-CM

## 2020-09-18 NOTE — Telephone Encounter (Signed)
Requested medications are due for refill today yes  Requested medications are on the active medication list yes  Last refill 8/3  Last visit Feb 2021  Notes to clinic Failed protocol of lipid panel within 360 days. Last lipid level was 2019.

## 2020-09-24 ENCOUNTER — Ambulatory Visit: Payer: BC Managed Care – PPO | Admitting: Family Medicine

## 2020-09-25 ENCOUNTER — Telehealth: Payer: Self-pay

## 2020-09-25 ENCOUNTER — Ambulatory Visit: Payer: BC Managed Care – PPO | Admitting: Family Medicine

## 2020-09-25 ENCOUNTER — Encounter: Payer: Self-pay | Admitting: Family Medicine

## 2020-09-25 ENCOUNTER — Other Ambulatory Visit: Payer: Self-pay

## 2020-09-25 VITALS — BP 110/60 | HR 64 | Ht 70.0 in | Wt 166.0 lb

## 2020-09-25 DIAGNOSIS — E785 Hyperlipidemia, unspecified: Secondary | ICD-10-CM

## 2020-09-25 DIAGNOSIS — I679 Cerebrovascular disease, unspecified: Secondary | ICD-10-CM

## 2020-09-25 DIAGNOSIS — Z23 Encounter for immunization: Secondary | ICD-10-CM

## 2020-09-25 MED ORDER — ATORVASTATIN CALCIUM 80 MG PO TABS: 80.0000 mg | ORAL_TABLET | Freq: Every day | ORAL | 1 refills | Status: DC

## 2020-09-25 MED ORDER — CLOPIDOGREL BISULFATE 75 MG PO TABS: 75.0000 mg | ORAL_TABLET | Freq: Every day | ORAL | 1 refills | Status: DC

## 2020-09-25 NOTE — Telephone Encounter (Signed)
I Put in the chart- thanks

## 2020-09-25 NOTE — Telephone Encounter (Unsigned)
Copied from CRM [id number]. Topic: General - Other >> Sep 25, 2020 10:05 AM Elliot Gault wrote: Patient was advised to call Delice Bison and inform when his 1st dose of Pfizer was given on 02/15/2020 and 2nd dose of Pfizer was given on  03/07/2020

## 2020-09-25 NOTE — Progress Notes (Signed)
Date:  09/25/2020   Name:  Maxwell Clark   DOB:  1955/12/05   MRN:  [id number]   Chief Complaint: Hyperlipidemia, cerebral vasc disease (takes plavix for this), and Flu Vaccine  Hyperlipidemia This is a chronic problem. The current episode started more than 1 year ago. The problem is controlled. Recent lipid tests were reviewed and are normal. He has no history of chronic renal disease, diabetes, hypothyroidism, liver disease, obesity or nephrotic syndrome. There are no known factors aggravating his hyperlipidemia. Pertinent negatives include no chest pain, focal sensory loss, focal weakness, leg pain, myalgias or shortness of breath. Current antihyperlipidemic treatment includes statins. The current treatment provides moderate improvement of lipids. There are no compliance problems.  Risk factors for coronary artery disease include hypertension and diabetes mellitus.  Neurologic Problem The patient's pertinent negatives include no altered mental status, clumsiness, focal sensory loss, focal weakness, loss of balance, memory loss, near-syncope, slurred speech, syncope, visual change or weakness. Primary symptoms comment: cerebrovascular disease. This is a chronic problem. The current episode started more than 1 year ago. The problem has been gradually improving since onset. There was no focality noted. Pertinent negatives include no abdominal pain, auditory change, back pain, chest pain, confusion, dizziness, fever, headaches, light-headedness, nausea, neck pain, palpitations, shortness of breath or vertigo. The treatment provided moderate relief. There is no history of liver disease.    Lab Results  Component Value Date   CREATININE 0.82 12/24/2019   BUN 16 12/24/2019   NA 138 12/24/2019   K 4.2 12/24/2019   CL 102 12/24/2019   CO2 22 12/24/2019   Lab Results  Component Value Date   CHOL 132 10/13/2018   HDL 80 10/13/2018   LDLCALC 37 10/13/2018   TRIG 77 10/13/2018   CHOLHDL 1.7  10/13/2018   Lab Results  Component Value Date   TSH 6.210 (H) 10/15/2014   Lab Results  Component Value Date   HGBA1C 7.5 06/06/2020   Lab Results  Component Value Date   WBC 6.9 12/24/2019   HGB 16.0 12/24/2019   HCT 45.0 12/24/2019   MCV 91 12/24/2019   PLT 249 12/24/2019   Lab Results  Component Value Date   ALT 21 10/15/2014   AST 21 10/15/2014   ALKPHOS 71 10/15/2014   BILITOT 0.5 10/15/2014     Review of Systems  Constitutional: Negative for chills and fever.  HENT: Negative for drooling, ear discharge, ear pain and sore throat.   Respiratory: Negative for cough, shortness of breath and wheezing.   Cardiovascular: Negative for chest pain, palpitations, leg swelling and near-syncope.  Gastrointestinal: Negative for abdominal pain, blood in stool, constipation, diarrhea and nausea.  Endocrine: Negative for polydipsia.  Genitourinary: Negative for dysuria, frequency, hematuria and urgency.  Musculoskeletal: Negative for back pain, myalgias and neck pain.  Skin: Negative for rash.  Allergic/Immunologic: Negative for environmental allergies.  Neurological: Negative for dizziness, vertigo, focal weakness, syncope, weakness, light-headedness, headaches and loss of balance.  Hematological: Does not bruise/bleed easily.  Psychiatric/Behavioral: Negative for confusion, memory loss and suicidal ideas. The patient is not nervous/anxious.     Patient Active Problem List   Diagnosis Date Noted  . Sinus infection 05/01/2020  . Corneal scar, right eye 09/29/2018  . Routine general medical examination at a health care facility 03/18/2015  . Type II diabetes mellitus with neurological manifestations ([place]) 03/18/2015  . Ischemic stroke ([place]) 03/18/2015  . Combined fat and carbohydrate induced hyperlipemia 03/18/2015  . Cerebral infarction  due to embolism of left middle cerebral artery ([place]) 02/26/2015  . DM (diabetes mellitus) ([place]) 02/26/2015  . HLD (hyperlipidemia)  02/26/2015  . Dyslipidemia, goal LDL below 70 10/18/2014  . Acute ischemic stroke ([place]) 10/15/2014  . Aphasia due to stroke 10/15/2014  . Cerebral vascular disease 10/15/2014    Allergies  Allergen Reactions  . Penicillins     unknown    Past Surgical History:  Procedure Laterality Date  . COLONOSCOPY WITH PROPOFOL N/A 05/16/2020   Procedure: COLONOSCOPY WITH PROPOFOL;  Surgeon: Jonathon Bellows, MD;  Location: [facility] ENDOSCOPY;  Service: Gastroenterology;  Laterality: N/A;  . LOOP RECORDER IMPLANT N/A 10/18/2014   Procedure: LOOP RECORDER IMPLANT;  Surgeon: Deboraha Sprang, MD;  Location: [facility] CATH LAB;  Service: Cardiovascular;  Laterality: N/A;  . TEE WITHOUT CARDIOVERSION N/A 10/18/2014   Procedure: TRANSESOPHAGEAL ECHOCARDIOGRAM (TEE);  Surgeon: Sanda Klein, MD;  Location: [facility] ENDOSCOPY;  Service: Cardiovascular;  Laterality: N/A;  loop recorder to follow     Social History   Tobacco Use  . Smoking status: Never Smoker  . Smokeless tobacco: Former Systems developer    Types: Chew  . Tobacco comment: quit around 2012  Substance Use Topics  . Alcohol use: Yes    Alcohol/week: 1.0 standard drink    Types: 1 Cans of beer per week    Comment: Occasional   . Drug use: No     Medication list has been reviewed and updated.  Current Meds  Medication Sig  . atorvastatin (LIPITOR) 80 MG tablet TAKE 1 TABLET DAILY  . B-D ULTRAFINE III SHORT PEN 31G X 8 MM MISC Inject 1 Syringe into the skin daily.  . clopidogrel (PLAVIX) 75 MG tablet TAKE 1 TABLET BY MOUTH EVERY DAY  . Continuous Blood Gluc Sensor (FREESTYLE LIBRE 2 SENSOR) MISC Use 1 kit every 14 (fourteen) days for glucose monitoring  . Empagliflozin-metFORMIN HCl ER 12.03-999 MG TB24 Take 2 tablets by mouth daily. o'connell  . glimepiride (AMARYL) 2 MG tablet Take 2 mg by mouth daily. O'Connell  . Insulin Glargine-Lixisenatide 100-33 UNT-MCG/ML SOPN Inject 17 Units into the skin. Dr Honor Junes  . Insulin Pen Needle (B-D ULTRAFINE III SHORT  PEN) 31G X 8 MM MISC USE ONCE DAILY  . pioglitazone (ACTOS) 15 MG tablet Take 15 mg by mouth daily. O'Connell    [facility] 2/9 Scores 09/25/2020 12/24/2019 07/06/2019 11/02/2017  PHQ - 2 Score 0 0 0 0  PHQ- 9 Score 0 0 0 0    GAD 7 : Generalized Anxiety Score 09/25/2020 12/24/2019  Nervous, Anxious, on Edge 0 0  Control/stop worrying 0 0  Worry too much - different things 0 0  Trouble relaxing 0 0  Restless 0 0  Easily annoyed or irritable 0 0  Afraid - awful might happen 0 0  Total GAD 7 Score 0 0    BP Readings from Last 3 Encounters:  09/25/20 110/60  05/16/20 121/71  12/24/19 120/76    Physical Exam  Wt Readings from Last 3 Encounters:  09/25/20 166 lb (75.3 kg)  05/16/20 150 lb (68 kg)  12/24/19 162 lb (73.5 kg)    BP 110/60   Pulse 64   Ht 5' 10" (1.778 m)   Wt 166 lb (75.3 kg)   BMI 23.82 kg/m   Assessment and Plan: 1. Cerebral vascular disease Chronic.  Stable.  Controlled.  There has been no episodes suggesting any cerebrovascular concerns over the past year.  Patient will continue to reduce reduce risk with  continuance of Plavix 75 mg daily and atorvastatin 80 mg once a day. - clopidogrel (PLAVIX) 75 MG tablet; Take 1 tablet (75 mg total) by mouth daily.  Dispense: 90 tablet; Refill: 1 - atorvastatin (LIPITOR) 80 MG tablet; Take 1 tablet (80 mg total) by mouth daily.  Dispense: 90 tablet; Refill: 1  2. Dyslipidemia, goal LDL below 70 Chronic.  Controlled.  Stable.  Will continue atorvastatin 80 mg once a day and check lipid panel to see if we are below 70 on LDL goal. - atorvastatin (LIPITOR) 80 MG tablet; Take 1 tablet (80 mg total) by mouth daily.  Dispense: 90 tablet; Refill: 1 - Lipid Panel With LDL/HDL Ratio  3. Need for immunization against influenza Discussed and administered. - Flu Vaccine QUAD 36+ mos IM

## 2020-10-09 DIAGNOSIS — E785 Hyperlipidemia, unspecified: Secondary | ICD-10-CM | POA: Diagnosis not present

## 2020-10-10 DIAGNOSIS — E785 Hyperlipidemia, unspecified: Secondary | ICD-10-CM | POA: Diagnosis not present

## 2020-10-10 DIAGNOSIS — E1165 Type 2 diabetes mellitus with hyperglycemia: Secondary | ICD-10-CM | POA: Diagnosis not present

## 2020-10-10 DIAGNOSIS — E1169 Type 2 diabetes mellitus with other specified complication: Secondary | ICD-10-CM | POA: Diagnosis not present

## 2020-10-10 DIAGNOSIS — Z794 Long term (current) use of insulin: Secondary | ICD-10-CM | POA: Diagnosis not present

## 2020-10-10 LAB — LIPID PANEL WITH LDL/HDL RATIO
Cholesterol, Total: 144 mg/dL (ref 100–199)
HDL: 78 mg/dL (ref >39)
LDL Chol Calc (NIH): 49 mg/dL (ref 0–99)
LDL/HDL Ratio: 0.6 ratio (ref 0.0–3.6)
Triglycerides: 90 mg/dL (ref 0–149)
VLDL Cholesterol Cal: 17 mg/dL (ref 5–40)

## 2020-12-01 DIAGNOSIS — E119 Type 2 diabetes mellitus without complications: Secondary | ICD-10-CM | POA: Diagnosis not present

## 2020-12-03 LAB — HM DIABETES EYE EXAM

## 2020-12-03 NOTE — Progress Notes (Unsigned)
Entered eye exam 

## 2020-12-17 ENCOUNTER — Other Ambulatory Visit: Payer: Self-pay | Admitting: Family Medicine

## 2020-12-17 DIAGNOSIS — I679 Cerebrovascular disease, unspecified: Secondary | ICD-10-CM

## 2020-12-17 DIAGNOSIS — E785 Hyperlipidemia, unspecified: Secondary | ICD-10-CM

## 2021-02-09 DIAGNOSIS — Z794 Long term (current) use of insulin: Secondary | ICD-10-CM | POA: Diagnosis not present

## 2021-02-09 DIAGNOSIS — E1169 Type 2 diabetes mellitus with other specified complication: Secondary | ICD-10-CM | POA: Diagnosis not present

## 2021-02-09 DIAGNOSIS — E1165 Type 2 diabetes mellitus with hyperglycemia: Secondary | ICD-10-CM | POA: Diagnosis not present

## 2021-02-09 DIAGNOSIS — E785 Hyperlipidemia, unspecified: Secondary | ICD-10-CM | POA: Diagnosis not present

## 2021-02-09 LAB — HEMOGLOBIN A1C: Hemoglobin A1C: 7.4

## 2021-02-09 LAB — MICROALBUMIN, URINE: Microalb, Ur: 32.5

## 2021-03-02 ENCOUNTER — Other Ambulatory Visit: Payer: Self-pay | Admitting: Family Medicine

## 2021-03-02 DIAGNOSIS — I679 Cerebrovascular disease, unspecified: Secondary | ICD-10-CM

## 2021-03-02 NOTE — Telephone Encounter (Signed)
   Notes to clinic:  Patient has upcoming appt on 4//26/2022 Review for supply until that time    Requested Prescriptions  Pending Prescriptions Disp Refills   clopidogrel (PLAVIX) 75 MG tablet [Pharmacy Med Name: CLOPIDOGREL BISULFATE TABS 75MG ] 90 tablet 3    Sig: TAKE 1 TABLET DAILY. NEED APPOINTMENT FOR FURTHER REFILLS      Hematology: Antiplatelets - clopidogrel Failed - 03/02/2021  1:49 AM      Failed - Evaluate AST, ALT within 2 months of therapy initiation.      Failed - ALT in normal range and within 360 days    ALT  Date Value Ref Range Status  10/15/2014 21 0 - 53 U/L Final   SGPT (ALT)  Date Value Ref Range Status  10/15/2014 34 U/L Final    Comment:    14-63 NOTE: New Reference Range 06/11/14           Failed - AST in normal range and within 360 days    AST  Date Value Ref Range Status  10/15/2014 21 0 - 37 U/L Final   SGOT(AST)  Date Value Ref Range Status  10/15/2014 27 15 - 37 Unit/L Final          Failed - HCT in normal range and within 180 days    Hematocrit  Date Value Ref Range Status  12/24/2019 45.0 37.5 - 51.0 % Final          Failed - HGB in normal range and within 180 days    Hemoglobin  Date Value Ref Range Status  12/24/2019 16.0 13.0 - 17.7 g/dL Final          Failed - PLT in normal range and within 180 days    Platelets  Date Value Ref Range Status  12/24/2019 249 150 - 450 x10E3/uL Final          Passed - Valid encounter within last 6 months    Recent Outpatient Visits           5 months ago Cerebral vascular disease   [facility] 02/21/2020, MD   1 year ago Dyslipidemia, goal LDL below 70   [facility] Duanne Limerick, MD   1 year ago Cerebral vascular disease   [facility] Duanne Limerick, MD   2 years ago Cerebral vascular disease   [facility] Duanne Limerick, MD   2 years ago Cerebral vascular disease   [facility] Duanne Limerick, MD       Future  Appointments             In 2 weeks Duanne Limerick, MD [facility], [facility]

## 2021-03-02 NOTE — Telephone Encounter (Signed)
Patient has appointment 04/26 for med refills.

## 2021-03-17 ENCOUNTER — Ambulatory Visit: Payer: BC Managed Care – PPO | Admitting: Family Medicine

## 2021-03-19 ENCOUNTER — Other Ambulatory Visit: Payer: Self-pay

## 2021-03-19 ENCOUNTER — Ambulatory Visit: Payer: BC Managed Care – PPO | Admitting: Family Medicine

## 2021-03-19 ENCOUNTER — Encounter: Payer: Self-pay | Admitting: Family Medicine

## 2021-03-19 DIAGNOSIS — E785 Hyperlipidemia, unspecified: Secondary | ICD-10-CM | POA: Diagnosis not present

## 2021-03-19 DIAGNOSIS — I679 Cerebrovascular disease, unspecified: Secondary | ICD-10-CM | POA: Diagnosis not present

## 2021-03-19 MED ORDER — ATORVASTATIN CALCIUM 80 MG PO TABS: 80.0000 mg | ORAL_TABLET | Freq: Every day | ORAL | 1 refills | Status: DC

## 2021-03-19 MED ORDER — CLOPIDOGREL BISULFATE 75 MG PO TABS
ORAL_TABLET | ORAL | 1 refills | Status: DC
Start: 2021-03-19 — End: 2021-09-02

## 2021-03-19 NOTE — Progress Notes (Signed)
Date:  03/19/2021   Name:  Maxwell Clark   DOB:  10/21/1956   MRN:  [id number]   Chief Complaint: Hyperlipidemia and Cerebrovascular Accident  Hyperlipidemia This is a chronic problem. The current episode started more than 1 year ago. The problem is controlled. Recent lipid tests were reviewed and are normal. He has no history of chronic renal disease, diabetes, hypothyroidism, liver disease, obesity or nephrotic syndrome. Factors aggravating his hyperlipidemia include thiazides. Pertinent negatives include no chest pain, focal sensory loss, focal weakness, leg pain, myalgias or shortness of breath. Current antihyperlipidemic treatment includes statins. The current treatment provides mild improvement of lipids. There are no compliance problems.  Risk factors for coronary artery disease include dyslipidemia and hypertension.  Cerebrovascular Accident This is a chronic problem. The current episode started more than 1 year ago. The problem has been gradually improving. Pertinent negatives include no abdominal pain, chest pain, chills, coughing, fever, headaches, myalgias, nausea, neck pain, numbness, rash, sore throat, visual change or weakness.    Lab Results  Component Value Date   CREATININE 0.82 12/24/2019   BUN 16 12/24/2019   NA 138 12/24/2019   K 4.2 12/24/2019   CL 102 12/24/2019   CO2 22 12/24/2019   Lab Results  Component Value Date   CHOL 144 10/09/2020   HDL 78 10/09/2020   LDLCALC 49 10/09/2020   TRIG 90 10/09/2020   CHOLHDL 1.7 10/13/2018   Lab Results  Component Value Date   TSH 6.210 (H) 10/15/2014   Lab Results  Component Value Date   HGBA1C 7.4 02/09/2021   Lab Results  Component Value Date   WBC 6.9 12/24/2019   HGB 16.0 12/24/2019   HCT 45.0 12/24/2019   MCV 91 12/24/2019   PLT 249 12/24/2019   Lab Results  Component Value Date   ALT 21 10/15/2014   AST 21 10/15/2014   ALKPHOS 71 10/15/2014   BILITOT 0.5 10/15/2014     Review of Systems   Constitutional: Negative for chills and fever.  HENT: Negative for drooling, ear discharge, ear pain and sore throat.   Respiratory: Negative for cough, shortness of breath and wheezing.   Cardiovascular: Negative for chest pain, palpitations and leg swelling.  Gastrointestinal: Negative for abdominal pain, blood in stool, constipation, diarrhea and nausea.  Endocrine: Negative for polydipsia.  Genitourinary: Negative for dysuria, frequency, hematuria and urgency.  Musculoskeletal: Negative for back pain, myalgias and neck pain.  Skin: Negative for rash.  Allergic/Immunologic: Negative for environmental allergies.  Neurological: Negative for dizziness, focal weakness, weakness, numbness and headaches.  Hematological: Does not bruise/bleed easily.  Psychiatric/Behavioral: Negative for suicidal ideas. The patient is not nervous/anxious.     Patient Active Problem List   Diagnosis Date Noted  . Sinus infection 05/01/2020  . Corneal scar, right eye 09/29/2018  . Routine general medical examination at a health care facility 03/18/2015  . Type II diabetes mellitus with neurological manifestations ([place]) 03/18/2015  . Ischemic stroke ([place]) 03/18/2015  . Combined fat and carbohydrate induced hyperlipemia 03/18/2015  . Cerebral infarction due to embolism of left middle cerebral artery ([place]) 02/26/2015  . DM (diabetes mellitus) ([place]) 02/26/2015  . HLD (hyperlipidemia) 02/26/2015  . Dyslipidemia, goal LDL below 70 10/18/2014  . Acute ischemic stroke ([place]) 10/15/2014  . Aphasia due to stroke 10/15/2014  . Cerebral vascular disease 10/15/2014    Allergies  Allergen Reactions  . Penicillins     unknown    Past Surgical History:  Procedure Laterality  Date  . COLONOSCOPY WITH PROPOFOL N/A 05/16/2020   Procedure: COLONOSCOPY WITH PROPOFOL;  Surgeon: Jonathon Bellows, MD;  Location[facility] ENDOSCOPY;  Service: Gastroenterology;  Laterality: N/A;  . LOOP RECORDER IMPLANT N/A 10/18/2014   Procedure:  LOOP RECORDER IMPLANT;  Surgeon: Deboraha Sprang, MD;  Location: [facility] CATH LAB;  Service: Cardiovascular;  Laterality: N/A;  . TEE WITHOUT CARDIOVERSION N/A 10/18/2014   Procedure: TRANSESOPHAGEAL ECHOCARDIOGRAM (TEE);  Surgeon: Sanda Klein, MD;  Location: [facility] ENDOSCOPY;  Service: Cardiovascular;  Laterality: N/A;  loop recorder to follow     Social History   Tobacco Use  . Smoking status: Never Smoker  . Smokeless tobacco: Former Systems developer    Types: Chew  . Tobacco comment: quit around 2012  Substance Use Topics  . Alcohol use: Yes    Alcohol/week: 1.0 standard drink    Types: 1 Cans of beer per week    Comment: Occasional   . Drug use: No     Medication list has been reviewed and updated.  Current Meds  Medication Sig  . atorvastatin (LIPITOR) 80 MG tablet Take 1 tablet (80 mg total) by mouth daily.  . B-D ULTRAFINE III SHORT PEN 31G X 8 MM MISC Inject 1 Syringe into the skin daily.  . clopidogrel (PLAVIX) 75 MG tablet TAKE 1 TABLET DAILY. NEED APPOINTMENT FOR FURTHER REFILLS  . Continuous Blood Gluc Sensor (FREESTYLE LIBRE 2 SENSOR) MISC Use 1 kit every 14 (fourteen) days for glucose monitoring  . Empagliflozin-metFORMIN HCl ER 12.03-999 MG TB24 Take 2 tablets by mouth daily. o'connell  . glimepiride (AMARYL) 4 MG tablet Take 1 mg by mouth daily. O'Connell  . Insulin Glargine-Lixisenatide 100-33 UNT-MCG/ML SOPN Inject 18 Units into the skin. Dr Honor Junes  . Insulin Pen Needle (B-D ULTRAFINE III SHORT PEN) 31G X 8 MM MISC USE ONCE DAILY  . pioglitazone (ACTOS) 15 MG tablet Take 15 mg by mouth daily. O'Connell    [facility] 2/9 Scores 03/19/2021 09/25/2020 12/24/2019 07/06/2019  PHQ - 2 Score 0 0 0 0  PHQ- 9 Score 0 0 0 0    GAD 7 : Generalized Anxiety Score 03/19/2021 09/25/2020 12/24/2019  Nervous, Anxious, on Edge 0 0 0  Control/stop worrying 0 0 0  Worry too much - different things 0 0 0  Trouble relaxing 0 0 0  Restless 0 0 0  Easily annoyed or irritable 0 0 0  Afraid - awful might  happen 0 0 0  Total GAD 7 Score 0 0 0    BP Readings from Last 3 Encounters:  03/19/21 130/76  09/25/20 110/60  05/16/20 121/71    Physical Exam Vitals and nursing note reviewed.  HENT:     Head: Normocephalic.     Right Ear: External ear normal.     Left Ear: External ear normal.     Nose: Nose normal.  Eyes:     General: No scleral icterus.       Right eye: No discharge.        Left eye: No discharge.     Conjunctiva/sclera: Conjunctivae normal.     Pupils: Pupils are equal, round, and reactive to light.  Neck:     Thyroid: No thyromegaly.     Vascular: No JVD.     Trachea: No tracheal deviation.  Cardiovascular:     Rate and Rhythm: Normal rate and regular rhythm.     Heart sounds: Normal heart sounds. No murmur heard. No friction rub. No gallop.   Pulmonary:  Effort: No respiratory distress.     Breath sounds: Normal breath sounds. No wheezing, rhonchi or rales.  Abdominal:     General: Bowel sounds are normal.     Palpations: Abdomen is soft. There is no mass.     Tenderness: There is no abdominal tenderness. There is no guarding or rebound.  Musculoskeletal:        General: No tenderness. Normal range of motion.     Cervical back: Normal range of motion and neck supple.  Lymphadenopathy:     Cervical: No cervical adenopathy.  Skin:    General: Skin is warm.     Findings: No rash.  Neurological:     Mental Status: He is alert and oriented to person, place, and time.     Cranial Nerves: Cranial nerves are intact. No cranial nerve deficit.     Sensory: Sensation is intact.     Motor: Motor function is intact.     Deep Tendon Reflexes: Reflexes are normal and symmetric.     Wt Readings from Last 3 Encounters:  03/19/21 166 lb (75.3 kg)  09/25/20 166 lb (75.3 kg)  05/16/20 150 lb (68 kg)    BP 130/76   Pulse 80   Ht _0  (1.778 m)   Wt 166 lb (75.3 kg)   BMI 23.82 kg/m   Assessment and Plan: 1. Cerebral vascular disease Chronic.  Controlled.   Stable.  Continue Plavix 75 mg once a day. - atorvastatin (LIPITOR) 80 MG tablet; Take 1 tablet (80 mg total) by mouth daily.  Dispense: 90 tablet; Refill: 1 - clopidogrel (PLAVIX) 75 MG tablet; TAKE 1 TABLET DAILY.  Dispense: 90 tablet; Refill: 1  2. Dyslipidemia, goal LDL below 70 Chronic.  Controlled.  Stable.  Review of lipid is noted that patient has an HDL in the 70s and LDL in the 50s patient will continue Lipitor 80 mg once a day.  We will recheck in 6 months and likely do lipid panel at that time. - atorvastatin (LIPITOR) 80 MG tablet; Take 1 tablet (80 mg total) by mouth daily.  Dispense: 90 tablet; Refill: 1

## 2021-04-27 ENCOUNTER — Encounter: Payer: Self-pay | Admitting: Family Medicine

## 2021-04-27 ENCOUNTER — Ambulatory Visit: Payer: BC Managed Care – PPO | Admitting: Family Medicine

## 2021-04-27 ENCOUNTER — Other Ambulatory Visit: Payer: Self-pay

## 2021-04-27 VITALS — BP 130/70 | HR 80 | Ht 70.0 in | Wt 164.0 lb

## 2021-04-27 DIAGNOSIS — L03313 Cellulitis of chest wall: Secondary | ICD-10-CM | POA: Diagnosis not present

## 2021-04-27 MED ORDER — SULFAMETHOXAZOLE-TRIMETHOPRIM 800-160 MG PO TABS: 1.0000 | ORAL_TABLET | Freq: Two times a day (BID) | ORAL | 0 refills | Status: DC

## 2021-04-27 NOTE — Progress Notes (Signed)
Date:  04/27/2021   Name:  Maxwell Clark   DOB:  Dec 29, 1955   MRN:  [id number]   Chief Complaint: Skin Discoloration (Noticed about 1 week ago- redness around L) breast. The nipple feels hard and the redness has spread up to chest and across, but not involving the R) breast )  Rash This is a new problem. The current episode started 1 to 4 weeks ago (10 days). The problem is unchanged. The affected locations include the chest. The rash is characterized by redness and swelling (nipple swelling). Pertinent negatives include no cough, diarrhea, fatigue, fever, shortness of breath or sore throat. Past treatments include nothing.    Lab Results  Component Value Date   CREATININE 0.82 12/24/2019   BUN 16 12/24/2019   NA 138 12/24/2019   K 4.2 12/24/2019   CL 102 12/24/2019   CO2 22 12/24/2019   Lab Results  Component Value Date   CHOL 144 10/09/2020   HDL 78 10/09/2020   LDLCALC 49 10/09/2020   TRIG 90 10/09/2020   CHOLHDL 1.7 10/13/2018   Lab Results  Component Value Date   TSH 6.210 (H) 10/15/2014   Lab Results  Component Value Date   HGBA1C 7.4 02/09/2021   Lab Results  Component Value Date   WBC 6.9 12/24/2019   HGB 16.0 12/24/2019   HCT 45.0 12/24/2019   MCV 91 12/24/2019   PLT 249 12/24/2019   Lab Results  Component Value Date   ALT 21 10/15/2014   AST 21 10/15/2014   ALKPHOS 71 10/15/2014   BILITOT 0.5 10/15/2014     Review of Systems  Constitutional: Negative for chills, fatigue and fever.  HENT: Negative for drooling, ear discharge, ear pain and sore throat.   Respiratory: Negative for cough, shortness of breath and wheezing.   Cardiovascular: Negative for chest pain, palpitations and leg swelling.  Gastrointestinal: Negative for abdominal pain, blood in stool, constipation, diarrhea and nausea.  Endocrine: Negative for polydipsia.  Genitourinary: Negative for dysuria, frequency, hematuria and urgency.  Musculoskeletal: Negative for back pain,  myalgias and neck pain.  Skin: Positive for rash.  Allergic/Immunologic: Negative for environmental allergies.  Neurological: Negative for dizziness and headaches.  Hematological: Does not bruise/bleed easily.  Psychiatric/Behavioral: Negative for suicidal ideas. The patient is not nervous/anxious.     Patient Active Problem List   Diagnosis Date Noted  . Sinus infection 05/01/2020  . Corneal scar, right eye 09/29/2018  . Routine general medical examination at a health care facility 03/18/2015  . Type II diabetes mellitus with neurological manifestations ([place]) 03/18/2015  . Ischemic stroke ([place]) 03/18/2015  . Combined fat and carbohydrate induced hyperlipemia 03/18/2015  . Cerebral infarction due to embolism of left middle cerebral artery ([place]) 02/26/2015  . DM (diabetes mellitus) ([place]) 02/26/2015  . HLD (hyperlipidemia) 02/26/2015  . Dyslipidemia, goal LDL below 70 10/18/2014  . Acute ischemic stroke ([place]) 10/15/2014  . Aphasia due to stroke 10/15/2014  . Cerebral vascular disease 10/15/2014    Allergies  Allergen Reactions  . Penicillins     unknown    Past Surgical History:  Procedure Laterality Date  . COLONOSCOPY WITH PROPOFOL N/A 05/16/2020   Procedure: COLONOSCOPY WITH PROPOFOL;  Surgeon: Jonathon Bellows, MD;  Location: [facility] ENDOSCOPY;  Service: Gastroenterology;  Laterality: N/A;  . LOOP RECORDER IMPLANT N/A 10/18/2014   Procedure: LOOP RECORDER IMPLANT;  Surgeon: Deboraha Sprang, MD;  Location: [facility] CATH LAB;  Service: Cardiovascular;  Laterality: N/A;  . TEE WITHOUT  CARDIOVERSION N/A 10/18/2014   Procedure: TRANSESOPHAGEAL ECHOCARDIOGRAM (TEE);  Surgeon: Sanda Klein, MD;  Location: [facility] ENDOSCOPY;  Service: Cardiovascular;  Laterality: N/A;  loop recorder to follow     Social History   Tobacco Use  . Smoking status: Never Smoker  . Smokeless tobacco: Former Systems developer    Types: Chew  . Tobacco comment: quit around 2012  Substance Use Topics  . Alcohol use: Yes     Alcohol/week: 1.0 standard drink    Types: 1 Cans of beer per week    Comment: Occasional   . Drug use: No     Medication list has been reviewed and updated.  Current Meds  Medication Sig  . atorvastatin (LIPITOR) 80 MG tablet Take 1 tablet (80 mg total) by mouth daily.  . B-D ULTRAFINE III SHORT PEN 31G X 8 MM MISC Inject 1 Syringe into the skin daily.  . clopidogrel (PLAVIX) 75 MG tablet TAKE 1 TABLET DAILY.  Marland Kitchen Continuous Blood Gluc Sensor (FREESTYLE LIBRE 2 SENSOR) MISC Use 1 kit every 14 (fourteen) days for glucose monitoring  . Empagliflozin-metFORMIN HCl ER 12.03-999 MG TB24 Take 2 tablets by mouth daily. o'connell  . glimepiride (AMARYL) 4 MG tablet Take 1 mg by mouth daily. O'Connell  . Insulin Glargine-Lixisenatide 100-33 UNT-MCG/ML SOPN Inject 18 Units into the skin. Dr Honor Junes  . Insulin Pen Needle (B-D ULTRAFINE III SHORT PEN) 31G X 8 MM MISC USE ONCE DAILY  . pioglitazone (ACTOS) 15 MG tablet Take 15 mg by mouth daily. O'Connell    [facility] 2/9 Scores 04/27/2021 03/19/2021 09/25/2020 12/24/2019  PHQ - 2 Score 0 0 0 0  PHQ- 9 Score 0 0 0 0    GAD 7 : Generalized Anxiety Score 04/27/2021 03/19/2021 09/25/2020 12/24/2019  Nervous, Anxious, on Edge 0 0 0 0  Control/stop worrying 0 0 0 0  Worry too much - different things 0 0 0 0  Trouble relaxing 0 0 0 0  Restless 0 0 0 0  Easily annoyed or irritable 0 0 0 0  Afraid - awful might happen 0 0 0 0  Total GAD 7 Score 0 0 0 0    BP Readings from Last 3 Encounters:  04/27/21 130/70  03/19/21 130/76  09/25/20 110/60    Physical Exam Vitals and nursing note reviewed.  HENT:     Head: Normocephalic.     Right Ear: External ear normal.     Left Ear: External ear normal.     Nose: Nose normal.  Eyes:     General: No scleral icterus.       Right eye: No discharge.        Left eye: No discharge.     Conjunctiva/sclera: Conjunctivae normal.     Pupils: Pupils are equal, round, and reactive to light.  Neck:     Thyroid: No  thyromegaly.     Vascular: No JVD.     Trachea: No tracheal deviation.  Cardiovascular:     Rate and Rhythm: Normal rate and regular rhythm.     Heart sounds: Normal heart sounds. No murmur heard. No friction rub. No gallop.   Pulmonary:     Effort: No respiratory distress.     Breath sounds: Normal breath sounds. No wheezing, rhonchi or rales.  Abdominal:     General: Bowel sounds are normal.     Palpations: Abdomen is soft. There is no mass.     Tenderness: There is no abdominal tenderness. There is no guarding or  rebound.  Musculoskeletal:        General: No tenderness. Normal range of motion.     Cervical back: Normal range of motion and neck supple.  Lymphadenopathy:     Cervical: No cervical adenopathy.  Skin:    General: Skin is warm.     Findings: Erythema present. No rash.  Neurological:     Mental Status: He is alert and oriented to person, place, and time.     Cranial Nerves: No cranial nerve deficit.     Deep Tendon Reflexes: Reflexes are normal and symmetric.     Wt Readings from Last 3 Encounters:  04/27/21 164 lb (74.4 kg)  03/19/21 166 lb (75.3 kg)  09/25/20 166 lb (75.3 kg)    BP 130/70   Pulse 80   Ht '5\' 10"'  (1.778 m)   Wt 164 lb (74.4 kg)   BMI 23.53 kg/m   Assessment and Plan:  1. Cellulitis of chest wall New onset.  Persistent.  Stable.  Approximately a week ago still developed firm and hardness of the left nipple which involved redness over a significant degree of his left pectoral area this is consistent with a cellulitis and we will treat with Bactrim DS 1 twice a day for 10 days patient is to call if there is no significant improvement in the next 48 to 72 hours. - sulfamethoxazole-trimethoprim (BACTRIM DS) 800-160 MG tablet; Take 1 tablet by mouth 2 (two) times daily.  Dispense: 20 tablet; Refill: 0

## 2021-05-04 ENCOUNTER — Telehealth: Payer: Self-pay | Admitting: Family Medicine

## 2021-05-04 NOTE — Telephone Encounter (Signed)
Pt wife Raffaele Derise is caling to update Dr. Yetta Barre regarding rash that is on the pt chest around the nipple. Pt found a tick the day after the ov with Dr. Yetta Barre. The rash is now worse.  Does the pt need to come back in for an ov? Pt is available after 2 pm.  Please advise CB- [phone] Preferred Pharmacy [facility] [place], [place].

## 2021-05-04 NOTE — Telephone Encounter (Signed)
Please call pt to schedule an appt. If pt does not want to wait or nothing available for tomorrow pt can go to [facility].  KP

## 2021-05-04 NOTE — Telephone Encounter (Signed)
Patient scheduled.

## 2021-05-07 ENCOUNTER — Other Ambulatory Visit: Payer: Self-pay

## 2021-05-07 ENCOUNTER — Encounter: Payer: Self-pay | Admitting: Family Medicine

## 2021-05-07 ENCOUNTER — Ambulatory Visit: Payer: BC Managed Care – PPO | Admitting: Family Medicine

## 2021-05-07 VITALS — BP 120/80 | HR 64 | Ht 70.0 in | Wt 161.0 lb

## 2021-05-07 DIAGNOSIS — L03313 Cellulitis of chest wall: Secondary | ICD-10-CM

## 2021-05-07 MED ORDER — DOXYCYCLINE HYCLATE 100 MG PO TABS: 100.0000 mg | ORAL_TABLET | Freq: Two times a day (BID) | ORAL | 0 refills | Status: DC

## 2021-05-07 NOTE — Progress Notes (Signed)
Date:  05/07/2021   Name:  Maxwell Clark   DOB:  [birth year]   MRN:  [id number]   Chief Complaint: Follow-up (Rash- found tick on L) breast a couple of days after visit)  Rash This is a new problem. The current episode started 1 to 4 weeks ago. The problem has been gradually worsening since onset. The affected locations include the chest. The rash is characterized by redness. He was exposed to nothing. Pertinent negatives include no cough, diarrhea, fever, shortness of breath or sore throat. The treatment provided mild relief.   Lab Results  Component Value Date   CREATININE 0.82 12/24/2019   BUN 16 12/24/2019   NA 138 12/24/2019   K 4.2 12/24/2019   CL 102 12/24/2019   CO2 22 12/24/2019   Lab Results  Component Value Date   CHOL 144 10/09/2020   HDL 78 10/09/2020   LDLCALC 49 10/09/2020   TRIG 90 10/09/2020   CHOLHDL 1.7 10/13/2018   Lab Results  Component Value Date   TSH 6.210 (H) 10/15/2014   Lab Results  Component Value Date   HGBA1C 7.4 02/09/2021   Lab Results  Component Value Date   WBC 6.9 12/24/2019   HGB 16.0 12/24/2019   HCT 45.0 12/24/2019   MCV 91 12/24/2019   PLT 249 12/24/2019   Lab Results  Component Value Date   ALT 21 10/15/2014   AST 21 10/15/2014   ALKPHOS 71 10/15/2014   BILITOT 0.5 10/15/2014     Review of Systems  Constitutional:  Negative for chills and fever.  HENT:  Negative for drooling, ear discharge, ear pain and sore throat.   Respiratory:  Negative for cough, shortness of breath and wheezing.   Cardiovascular:  Negative for chest pain, palpitations and leg swelling.  Gastrointestinal:  Negative for abdominal pain, blood in stool, constipation, diarrhea and nausea.  Endocrine: Negative for polydipsia.  Genitourinary:  Negative for dysuria, frequency, hematuria and urgency.  Musculoskeletal:  Negative for back pain, myalgias and neck pain.  Skin:  Positive for rash.  Allergic/Immunologic: Negative for environmental  allergies.  Neurological:  Negative for dizziness and headaches.  Hematological:  Does not bruise/bleed easily.  Psychiatric/Behavioral:  Negative for suicidal ideas. The patient is not nervous/anxious.    Patient Active Problem List   Diagnosis Date Noted   Sinus infection 05/01/2020   Corneal scar, right eye 09/29/2018   Routine general medical examination at a health care facility 03/18/2015   Type II diabetes mellitus with neurological manifestations ([place]) 03/18/2015   Ischemic stroke ([place]) 03/18/2015   Combined fat and carbohydrate induced hyperlipemia 03/18/2015   Cerebral infarction due to embolism of left middle cerebral artery ([place]) 02/26/2015   DM (diabetes mellitus) ([place]) 02/26/2015   HLD (hyperlipidemia) 02/26/2015   Dyslipidemia, goal LDL below 70 10/18/2014   Acute ischemic stroke ([place]) 10/15/2014   Aphasia due to stroke 10/15/2014   Cerebral vascular disease 10/15/2014    Allergies  Allergen Reactions   Penicillins     unknown    Past Surgical History:  Procedure Laterality Date   COLONOSCOPY WITH PROPOFOL N/A 05/16/2020   Procedure: COLONOSCOPY WITH PROPOFOL;  Surgeon: Jonathon Bellows, MD;  Location[facility] END[facility]COPY;  Service: Gastroenterology;  Laterality: N/A;   LOOP RECORDER IMPLANT N/A 10/18/2014   Procedure: LOOP RECORDER IMPLANT;  Surgeon: Deboraha Sprang, MD;  Location: [facility] CATH LAB;  Service: Cardiovascular;  Laterality: N/A;   TEE WITHOUT CARDIOVERSION N/A 10/18/2014   Procedure: TRANSESOPHAGEAL ECHOCARDIOGRAM (  TEE);  Surgeon: Sanda Klein, MD;  Location: [facility] ENDOSCOPY;  Service: Cardiovascular;  Laterality: N/A;  loop recorder to follow     Social History   Tobacco Use   Smoking status: Never   Smokeless tobacco: Former    Types: Chew   Tobacco comments:    quit around 2012  Substance Use Topics   Alcohol use: Yes    Alcohol/week: 1.0 standard drink    Types: 1 Cans of beer per week    Comment: Occasional    Drug use: No     Medication list  has been reviewed and updated.  Current Meds  Medication Sig   atorvastatin (LIPITOR) 80 MG tablet Take 1 tablet (80 mg total) by mouth daily.   B-D ULTRAFINE III SHORT PEN 31G X 8 MM MISC Inject 1 Syringe into the skin daily.   clopidogrel (PLAVIX) 75 MG tablet TAKE 1 TABLET DAILY.   Continuous Blood Gluc Sensor (FREESTYLE LIBRE 2 SENSOR) MISC Use 1 kit every 14 (fourteen) days for glucose monitoring   Empagliflozin-metFORMIN HCl ER 12.03-999 MG TB24 Take 2 tablets by mouth daily. o'connell   glimepiride (AMARYL) 4 MG tablet Take 1 mg by mouth daily. O'Connell   Insulin Glargine-Lixisenatide 100-33 UNT-MCG/ML SOPN Inject 18 Units into the skin. Dr Honor Junes   Insulin Pen Needle (B-D ULTRAFINE III SHORT PEN) 31G X 8 MM MISC USE ONCE DAILY   pioglitazone (ACTOS) 15 MG tablet Take 15 mg by mouth daily. O'Connell    [facility] 2/9 Scores 04/27/2021 03/19/2021 09/25/2020 12/24/2019  PHQ - 2 Score 0 0 0 0  PHQ- 9 Score 0 0 0 0    GAD 7 : Generalized Anxiety Score 04/27/2021 03/19/2021 09/25/2020 12/24/2019  Nervous, Anxious, on Edge 0 0 0 0  Control/stop worrying 0 0 0 0  Worry too much - different things 0 0 0 0  Trouble relaxing 0 0 0 0  Restless 0 0 0 0  Easily annoyed or irritable 0 0 0 0  Afraid - awful might happen 0 0 0 0  Total GAD 7 Score 0 0 0 0    BP Readings from Last 3 Encounters:  05/07/21 120/80  04/27/21 130/70  03/19/21 130/76    Physical Exam Vitals and nursing note reviewed.  HENT:     Head: Normocephalic.     Right Ear: External ear normal.     Left Ear: External ear normal.     Nose: Nose normal.  Eyes:     General: No scleral icterus.       Right eye: No discharge.        Left eye: No discharge.     Conjunctiva/sclera: Conjunctivae normal.     Pupils: Pupils are equal, round, and reactive to light.  Neck:     Thyroid: No thyromegaly.     Vascular: No JVD.     Trachea: No tracheal deviation.  Cardiovascular:     Rate and Rhythm: Normal rate and regular rhythm.      Heart sounds: Normal heart sounds. No murmur heard.   No friction rub. No gallop.  Pulmonary:     Effort: No respiratory distress.     Breath sounds: Normal breath sounds. No wheezing or rales.  Abdominal:     General: Bowel sounds are normal.     Palpations: Abdomen is soft. There is no mass.     Tenderness: There is no abdominal tenderness. There is no guarding or rebound.  Musculoskeletal:  General: No tenderness. Normal range of motion.     Cervical back: Normal range of motion and neck supple.  Lymphadenopathy:     Cervical: No cervical adenopathy.  Skin:    General: Skin is warm.     Findings: Erythema present. No rash.  Neurological:     Mental Status: He is alert and oriented to person, place, and time.     Cranial Nerves: No cranial nerve deficit.     Deep Tendon Reflexes: Reflexes are normal and symmetric.    Wt Readings from Last 3 Encounters:  05/07/21 161 lb (73 kg)  04/27/21 164 lb (74.4 kg)  03/19/21 166 lb (75.3 kg)    BP 120/80   Pulse 64   Ht '5\' 10"'  (1.778 m)   Wt 161 lb (73 kg)   BMI 23.10 kg/m   Assessment and Plan:  1. Cellulitis of chest wall New onset.  Persistent.  Stable.  Patient found a deer tick in the vicinity of the rash more recently and I do not know if the rash was associated with the tick that that he removed from his left chest is associated with this.  In any event the tick had been removed and there has been improvement of his erythema and swelling however not total resolution.  We will commence doxycycline 100 mg twice a day for 10 days and recheck if necessary.

## 2021-06-12 DIAGNOSIS — E785 Hyperlipidemia, unspecified: Secondary | ICD-10-CM | POA: Diagnosis not present

## 2021-06-12 DIAGNOSIS — E1169 Type 2 diabetes mellitus with other specified complication: Secondary | ICD-10-CM | POA: Diagnosis not present

## 2021-06-12 DIAGNOSIS — Z794 Long term (current) use of insulin: Secondary | ICD-10-CM | POA: Diagnosis not present

## 2021-06-12 DIAGNOSIS — E1165 Type 2 diabetes mellitus with hyperglycemia: Secondary | ICD-10-CM | POA: Diagnosis not present

## 2021-09-02 ENCOUNTER — Other Ambulatory Visit: Payer: Self-pay

## 2021-09-02 DIAGNOSIS — I679 Cerebrovascular disease, unspecified: Secondary | ICD-10-CM

## 2021-09-02 DIAGNOSIS — E785 Hyperlipidemia, unspecified: Secondary | ICD-10-CM

## 2021-09-02 MED ORDER — CLOPIDOGREL BISULFATE 75 MG PO TABS: ORAL_TABLET | ORAL | 0 refills | Status: DC

## 2021-09-02 MED ORDER — ATORVASTATIN CALCIUM 80 MG PO TABS: 80.0000 mg | ORAL_TABLET | Freq: Every day | ORAL | 0 refills | Status: DC

## 2021-09-15 ENCOUNTER — Other Ambulatory Visit: Payer: Self-pay | Admitting: Family Medicine

## 2021-09-15 DIAGNOSIS — I679 Cerebrovascular disease, unspecified: Secondary | ICD-10-CM

## 2021-09-15 DIAGNOSIS — E785 Hyperlipidemia, unspecified: Secondary | ICD-10-CM

## 2021-09-15 NOTE — Telephone Encounter (Signed)
Refill request from [facility]. Med was refilled 09/02/21 #90. [facility] does not have med on file. [facility] and me is at this [facility] but has not been picked up. Pharmacist stated pt does not have insurance. Called pt and LM on VM to call [facility] and discuss with pharmacist.  Refusing this request from [facility].   Requested Prescriptions  Pending Prescriptions Disp Refills   atorvastatin (LIPITOR) 80 MG tablet [Pharmacy Med Name: ATORVASTATIN TABS 80MG ] 90 tablet 3    Sig: TAKE 1 TABLET DAILY     Cardiovascular:  Antilipid - Statins Passed - 09/15/2021  1:01 AM      Passed - Total Cholesterol in normal range and within 360 days    Cholesterol, Total  Date Value Ref Range Status  10/09/2020 144 100 - 199 mg/dL Final          Passed - LDL in normal range and within 360 days    LDL Chol Calc (NIH)  Date Value Ref Range Status  10/09/2020 49 0 - 99 mg/dL Final          Passed - HDL in normal range and within 360 days    HDL  Date Value Ref Range Status  10/09/2020 78 >39 mg/dL Final          Passed - Triglycerides in normal range and within 360 days    Triglycerides  Date Value Ref Range Status  10/09/2020 90 0 - 149 mg/dL Final          Passed - Patient is not pregnant      Passed - Valid encounter within last 12 months    Recent Outpatient Visits           4 months ago Cellulitis of chest wall   [facility] 10/11/2020, MD   4 months ago Cellulitis of chest wall   [facility], MD   6 months ago Cerebral vascular disease   [facility] Duanne Limerick, MD   11 months ago Cerebral vascular disease   [facility] Duanne Limerick, MD   1 year ago Dyslipidemia, goal LDL below 70   [facility] Duanne Limerick, MD       Future Appointments             In 3 weeks Duanne Limerick, MD [facility], [facility]

## 2021-10-08 ENCOUNTER — Other Ambulatory Visit: Payer: Self-pay

## 2021-10-08 ENCOUNTER — Encounter: Payer: Self-pay | Admitting: Family Medicine

## 2021-10-08 ENCOUNTER — Telehealth: Payer: Self-pay | Admitting: Family Medicine

## 2021-10-08 ENCOUNTER — Ambulatory Visit: Payer: Self-pay | Admitting: Family Medicine

## 2021-10-08 VITALS — BP 138/72 | HR 63 | Ht 70.0 in | Wt 165.0 lb

## 2021-10-08 DIAGNOSIS — E785 Hyperlipidemia, unspecified: Secondary | ICD-10-CM

## 2021-10-08 MED ORDER — ATORVASTATIN CALCIUM 80 MG PO TABS: 80.0000 mg | ORAL_TABLET | Freq: Every day | ORAL | 1 refills | Status: DC

## 2021-10-08 NOTE — Telephone Encounter (Signed)
Wife calling to advise that when pt turned 46, he no longer uses [facility].  Pt saw Dr Yetta Barre today and his Rx atorvastatin (LIPITOR) 80 MG tablet [facility]  Can you res[facility] to  [id number] [place], [place] - [address]   ([facility] has been removed from profile)

## 2021-10-08 NOTE — Telephone Encounter (Signed)
[facility]

## 2021-10-08 NOTE — Progress Notes (Signed)
Date:  10/08/2021   Name:  Maxwell Clark   DOB:  1956/10/23   MRN:  [id number]   Chief Complaint: No chief complaint on file.  Hyperlipidemia This is a chronic problem. The current episode started more than 1 year ago. The problem is controlled. Recent lipid tests were reviewed and are normal. He has no history of chronic renal disease, diabetes, hypothyroidism, liver disease, obesity or nephrotic syndrome. There are no known factors aggravating his hyperlipidemia. Pertinent negatives include no chest pain, focal sensory loss, focal weakness, leg pain, myalgias or shortness of breath. Current antihyperlipidemic treatment includes statins. The current treatment provides moderate improvement of lipids. There are no compliance problems.  Risk factors for coronary artery disease include dyslipidemia.   Lab Results  Component Value Date   CREATININE 0.82 12/24/2019   BUN 16 12/24/2019   NA 138 12/24/2019   K 4.2 12/24/2019   CL 102 12/24/2019   CO2 22 12/24/2019   Lab Results  Component Value Date   CHOL 144 10/09/2020   HDL 78 10/09/2020   LDLCALC 49 10/09/2020   TRIG 90 10/09/2020   CHOLHDL 1.7 10/13/2018   Lab Results  Component Value Date   TSH 6.210 (H) 10/15/2014   Lab Results  Component Value Date   HGBA1C 7.4 02/09/2021   Lab Results  Component Value Date   WBC 6.9 12/24/2019   HGB 16.0 12/24/2019   HCT 45.0 12/24/2019   MCV 91 12/24/2019   PLT 249 12/24/2019   Lab Results  Component Value Date   ALT 21 10/15/2014   AST 21 10/15/2014   ALKPHOS 71 10/15/2014   BILITOT 0.5 10/15/2014   No components found for: VITD  Review of Systems  Constitutional:  Negative for chills and fever.  HENT:  Negative for drooling, ear discharge, ear pain and sore throat.   Respiratory:  Negative for cough, shortness of breath and wheezing.   Cardiovascular:  Negative for chest pain, palpitations and leg swelling.  Gastrointestinal:  Negative for abdominal pain, blood in  stool, constipation, diarrhea and nausea.  Endocrine: Negative for polydipsia.  Genitourinary:  Negative for dysuria, frequency, hematuria and urgency.  Musculoskeletal:  Negative for back pain, myalgias and neck pain.  Skin:  Negative for rash.  Allergic/Immunologic: Negative for environmental allergies.  Neurological:  Negative for dizziness, focal weakness and headaches.  Hematological:  Does not bruise/bleed easily.  Psychiatric/Behavioral:  Negative for suicidal ideas. The patient is not nervous/anxious.    Patient Active Problem List   Diagnosis Date Noted   Sinus infection 05/01/2020   Corneal scar, right eye 09/29/2018   Routine general medical examination at a health care facility 03/18/2015   Type II diabetes mellitus with neurological manifestations ([facility]) 03/18/2015   Ischemic stroke ([facility]) 03/18/2015   Combined fat and carbohydrate induced hyperlipemia 03/18/2015   Cerebral infarction due to embolism of left middle cerebral artery ([facility]) 02/26/2015   DM (diabetes mellitus) (H[facility]) 02/26/2015   HLD (hyperlipidemia) 02/26/2015   Dyslipidemia, goal LDL below 70 10/18/2014   Acute ischemic stroke (H[facility]) 10/15/2014   Aphasia due to stroke 10/15/2014   Cerebral vascular disease 10/15/2014    Allergies  Allergen Reactions   Penicillins     unknown    Past Surgical History:  Procedure Laterality Date   COLONOSCOPY WITH PROPOFOL N/A 05/16/2020   Procedure: COLONOSCOPY WITH PROPOFOL;  Surgeon: Wyline MoodAnna, Kiran, MD;  [facility];  Service: Gastroenterology;  Laterality: N/A;   LOOP RECORDER IMPLANT N/A  10/18/2014   Procedure: LOOP RECORDER IMPLANT;  Surgeon: Deboraha Sprang, MD;  Location: [facility] CATH LAB;  Service: Cardiovascular;  Laterality: N/A;   TEE WITHOUT CARDIOVERSION N/A 10/18/2014   Procedure: TRANSESOPHAGEAL ECHOCARDIOGRAM (TEE);  Surgeon: Sanda Klein, MD;  Location: [facility] ENDOSCOPY;  Service: Cardiovascular;  Laterality: N/A;  loop recorder to follow     Social  History   Tobacco Use   Smoking status: Never   Smokeless tobacco: Former    Types: Chew   Tobacco comments:    quit around 2012  Substance Use Topics   Alcohol use: Yes    Alcohol/week: 1.0 standard drink    Types: 1 Cans of beer per week    Comment: Occasional    Drug use: No     Medication list has been reviewed and updated.  No outpatient medications have been marked as taking for the 10/08/21 encounter (Office Visit) with Juline Patch, MD.    [facility] 2/9 Scores 04/27/2021 03/19/2021 09/25/2020 12/24/2019  PHQ - 2 Score 0 0 0 0  PHQ- 9 Score 0 0 0 0    GAD 7 : Generalized Anxiety Score 04/27/2021 03/19/2021 09/25/2020 12/24/2019  Nervous, Anxious, on Edge 0 0 0 0  Control/stop worrying 0 0 0 0  Worry too much - different things 0 0 0 0  Trouble relaxing 0 0 0 0  Restless 0 0 0 0  Easily annoyed or irritable 0 0 0 0  Afraid - awful might happen 0 0 0 0  Total GAD 7 Score 0 0 0 0    BP Readings from Last 3 Encounters:  05/07/21 120/80  04/27/21 130/70  03/19/21 130/76    Physical Exam Vitals and nursing note reviewed.  HENT:     Head: Normocephalic.     Right Ear: External ear normal.     Left Ear: External ear normal.     Nose: Nose normal.  Eyes:     General: No scleral icterus.       Right eye: No discharge.        Left eye: No discharge.     Conjunctiva/sclera: Conjunctivae normal.     Pupils: Pupils are equal, round, and reactive to light.  Neck:     Thyroid: No thyromegaly.     Vascular: No JVD.     Trachea: No tracheal deviation.  Cardiovascular:     Rate and Rhythm: Normal rate and regular rhythm.     Heart sounds: Normal heart sounds, S1 normal and S2 normal. No murmur heard. No systolic murmur is present.  No diastolic murmur is present.    No friction rub. No gallop. No S3 or S4 sounds.  Pulmonary:     Effort: No respiratory distress.     Breath sounds: Normal breath sounds. No wheezing, rhonchi or rales.  Abdominal:     General: Bowel sounds are  normal.     Palpations: Abdomen is soft. There is no hepatomegaly, splenomegaly or mass.     Tenderness: There is no abdominal tenderness. There is no guarding or rebound.  Musculoskeletal:        General: No tenderness. Normal range of motion.     Cervical back: Normal range of motion and neck supple.  Lymphadenopathy:     Cervical: No cervical adenopathy.  Skin:    General: Skin is warm.     Findings: No rash.  Neurological:     Mental Status: He is alert and oriented to person, place, and time.  Cranial Nerves: No cranial nerve deficit.     Deep Tendon Reflexes: Reflexes are normal and symmetric.    Wt Readings from Last 3 Encounters:  05/07/21 161 lb (73 kg)  04/27/21 164 lb (74.4 kg)  03/19/21 166 lb (75.3 kg)    There were no vitals taken for this visit.  Assessment and Plan:  1. Dyslipidemia, goal LDL below 70 Chronic.  Controlled.  Stable.  Hyperlipidemia is been controlled with atorvastatin 80 mg once a day.  We will recheck an LDL with lipid panel and adjust accordingly with a goal of 70 which is going to be difficult.  Patient was not fasting did have peanut butter crackers for lunch. - atorvastatin (LIPITOR) 80 MG tablet; Take 1 tablet (80 mg total) by mouth daily.  Dispense: 90 tablet; Refill: 1 - Lipid Panel With LDL/HDL Ratio

## 2021-10-09 LAB — LIPID PANEL WITH LDL/HDL RATIO
Cholesterol, Total: 171 mg/dL (ref 100–199)
HDL: 89 mg/dL (ref >39)
LDL Chol Calc (NIH): 72 mg/dL (ref 0–99)
LDL/HDL Ratio: 0.8 ratio (ref 0.0–3.6)
Triglycerides: 45 mg/dL (ref 0–149)
VLDL Cholesterol Cal: 10 mg/dL (ref 5–40)

## 2021-10-23 DIAGNOSIS — Z794 Long term (current) use of insulin: Secondary | ICD-10-CM | POA: Diagnosis not present

## 2021-10-23 DIAGNOSIS — E1165 Type 2 diabetes mellitus with hyperglycemia: Secondary | ICD-10-CM | POA: Diagnosis not present

## 2021-10-23 DIAGNOSIS — E785 Hyperlipidemia, unspecified: Secondary | ICD-10-CM | POA: Diagnosis not present

## 2021-10-23 DIAGNOSIS — E1169 Type 2 diabetes mellitus with other specified complication: Secondary | ICD-10-CM | POA: Diagnosis not present

## 2021-11-17 DIAGNOSIS — Z23 Encounter for immunization: Secondary | ICD-10-CM | POA: Diagnosis not present

## 2021-12-19 ENCOUNTER — Other Ambulatory Visit: Payer: Self-pay | Admitting: Family Medicine

## 2021-12-19 DIAGNOSIS — I679 Cerebrovascular disease, unspecified: Secondary | ICD-10-CM

## 2021-12-19 NOTE — Telephone Encounter (Signed)
Requested medication (s) are due for refill today: yes  Requested medication (s) are on the active medication list: 09/02/21 #90  Last refill:  09/02/21  Future visit scheduled: no  Notes to clinic:  overdue lab work   Requested Prescriptions  Pending Prescriptions Disp Refills   clopidogrel (PLAVIX) 75 MG tablet [Pharmacy Med Name: CLOPIDOGREL 75 MG TABLET] 90 tablet 0    Sig: TAKE 1 TABLET BY [place]     Hematology: Antiplatelets - clopidogrel Failed - 12/19/2021 11:45 AM      Failed - Evaluate AST, ALT within 2 months of therapy initiation.      Failed - ALT in normal range and within 360 days    ALT  Date Value Ref Range Status  10/15/2014 21 0 - 53 U/L Final   SGPT (ALT)  Date Value Ref Range Status  10/15/2014 34 U/L Final    Comment:    14-63 NOTE: New Reference Range 06/11/14           Failed - AST in normal range and within 360 days    AST  Date Value Ref Range Status  10/15/2014 21 0 - 37 U/L Final   SGOT(AST)  Date Value Ref Range Status  10/15/2014 27 15 - 37 Unit/L Final          Failed - HCT in normal range and within 180 days    Hematocrit  Date Value Ref Range Status  12/24/2019 45.0 37.5 - 51.0 % Final          Failed - HGB in normal range and within 180 days    Hemoglobin  Date Value Ref Range Status  12/24/2019 16.0 13.0 - 17.7 g/dL Final          Failed - PLT in normal range and within 180 days    Platelets  Date Value Ref Range Status  12/24/2019 249 150 - 450 x10E3/uL Final          Passed - Valid encounter within last 6 months    Recent Outpatient Visits           2 months ago Dyslipidemia, goal LDL below [facility] Juline Patch, MD   7 months ago Cellulitis of chest wall   [facility] Juline Patch, MD   7 months ago Cellulitis of chest wall   [facility] Juline Patch, MD   9 months ago Cerebral vascular disease   [facility] Juline Patch, MD   1 year  ago Cerebral vascular disease   [facility] Juline Patch, MD

## 2022-01-29 ENCOUNTER — Other Ambulatory Visit: Payer: Self-pay | Admitting: Family Medicine

## 2022-01-29 DIAGNOSIS — I679 Cerebrovascular disease, unspecified: Secondary | ICD-10-CM

## 2022-02-01 DIAGNOSIS — E119 Type 2 diabetes mellitus without complications: Secondary | ICD-10-CM | POA: Diagnosis not present

## 2022-02-01 NOTE — Telephone Encounter (Signed)
Requested medication (s) are due for refill today:   No ? ?Requested medication (s) are on the active medication list:   Yes ? ?Future visit scheduled:   No ? ? ?Last ordered: 12/21/2021 #90, 0 refills ? ?Returned because protocol criteria not met.   Labs due  ? ?Requested Prescriptions  ?Pending Prescriptions Disp Refills  ? clopidogrel (PLAVIX) 75 MG tablet [Pharmacy Med Name: CLOPIDOGREL 75 MG TABLET] 90 tablet 0  ?  Sig: TAKE 1 TABLET BY MOUTH EVERY DAY  ?  ? Hematology: Antiplatelets - clopidogrel Failed - 01/29/2022  6:36 PM  ?  ?  Failed - HCT in normal range and within 180 days  ?  Hematocrit  ?Date Value Ref Range Status  ?12/24/2019 45.0 37.5 - 51.0 % Final  ?  ?  ?  ?  Failed - HGB in normal range and within 180 days  ?  Hemoglobin  ?Date Value Ref Range Status  ?12/24/2019 16.0 13.0 - 17.7 g/dL Final  ?  ?  ?  ?  Failed - PLT in normal range and within 180 days  ?  Platelets  ?Date Value Ref Range Status  ?12/24/2019 249 150 - 450 x10E3/uL Final  ?  ?  ?  ?  Failed - Cr in normal range and within 360 days  ?  Creatinine  ?Date Value Ref Range Status  ?10/15/2014 1.12 0.60 - 1.30 mg/dL Final  ? ?Creatinine, Ser  ?Date Value Ref Range Status  ?12/24/2019 0.82 0.76 - 1.27 mg/dL Final  ?  ?  ?  ?  Passed - Valid encounter within last 6 months  ?  Recent Outpatient Visits   ? ?      ? 3 months ago Dyslipidemia, goal LDL below 70  ? [facility] Juline Patch, MD  ? 9 months ago Cellulitis of chest wall  ? [facility] Juline Patch, MD  ? 9 months ago Cellulitis of chest wall  ? [facility] Juline Patch, MD  ? 10 months ago Cerebral vascular disease  ? [facility] Juline Patch, MD  ? 1 year ago Cerebral vascular disease  ? [facility] Juline Patch, MD  ? ?  ?  ? ?  ?  ?  ? ?

## 2022-03-25 DIAGNOSIS — E1165 Type 2 diabetes mellitus with hyperglycemia: Secondary | ICD-10-CM | POA: Diagnosis not present

## 2022-03-25 DIAGNOSIS — Z794 Long term (current) use of insulin: Secondary | ICD-10-CM | POA: Diagnosis not present

## 2022-03-25 LAB — HEMOGLOBIN A1C: Hemoglobin A1C: 9.2

## 2022-06-08 ENCOUNTER — Other Ambulatory Visit: Payer: Self-pay | Admitting: Family Medicine

## 2022-06-08 DIAGNOSIS — I679 Cerebrovascular disease, unspecified: Secondary | ICD-10-CM

## 2022-06-09 ENCOUNTER — Telehealth: Payer: Self-pay | Admitting: Family Medicine

## 2022-06-09 NOTE — Telephone Encounter (Signed)
-----   Message from Everitt Amber sent at 06/09/2022  9:13 AM EDT ----- Please call Tricia/ pt's wife and schedule appt for med refill this week or next

## 2022-06-09 NOTE — Telephone Encounter (Signed)
Requested medication (s) are due for refill today: yes  Requested medication (s) are on the active medication list: yes  Last refill:  02/01/22 #90 with 0 RF  Future visit scheduled: no, seen 10/08/2021  Notes to clinic:  Failed protocol of labs within 12 months, (12/2019) no upcoming appt, please assess.       Requested Prescriptions  Pending Prescriptions Disp Refills   clopidogrel (PLAVIX) 75 MG tablet [Pharmacy Med Name: CLOPIDOGREL 75 MG TABLET] 90 tablet 0    Sig: TAKE 1 TABLET BY MOUTH EVERY DAY     Hematology: Antiplatelets - clopidogrel Failed - 06/08/2022  2:01 AM      Failed - HCT in normal range and within 180 days    Hematocrit  Date Value Ref Range Status  12/24/2019 45.0 37.5 - 51.0 % Final         Failed - HGB in normal range and within 180 days    Hemoglobin  Date Value Ref Range Status  12/24/2019 16.0 13.0 - 17.7 g/dL Final         Failed - PLT in normal range and within 180 days    Platelets  Date Value Ref Range Status  12/24/2019 249 150 - 450 x10E3/uL Final         Failed - Cr in normal range and within 360 days    Creatinine  Date Value Ref Range Status  10/15/2014 1.12 0.60 - 1.30 mg/dL Final   Creatinine, Ser  Date Value Ref Range Status  12/24/2019 0.82 0.76 - 1.27 mg/dL Final         Failed - Valid encounter within last 6 months    Recent Outpatient Visits           8 months ago Dyslipidemia, goal LDL below 70   [facility] Duanne Limerick, MD   1 year ago Cellulitis of chest wall   [facility] Duanne Limerick, MD   1 year ago Cellulitis of chest wall   [facility] Duanne Limerick, MD   1 year ago Cerebral vascular disease   [facility] Duanne Limerick, MD   1 year ago Cerebral vascular disease   [facility] Duanne Limerick, MD

## 2022-06-09 NOTE — Telephone Encounter (Signed)
Left voice mail to set up medication refill appointment sometime this week or next week.

## 2022-07-01 ENCOUNTER — Telehealth: Payer: Self-pay

## 2022-07-01 ENCOUNTER — Telehealth: Payer: Self-pay | Admitting: Family Medicine

## 2022-07-01 ENCOUNTER — Ambulatory Visit (INDEPENDENT_AMBULATORY_CARE_PROVIDER_SITE_OTHER): Payer: Medicare Other | Admitting: Family Medicine

## 2022-07-01 ENCOUNTER — Encounter: Payer: Self-pay | Admitting: Family Medicine

## 2022-07-01 VITALS — BP 110/60 | HR 64 | Ht 70.0 in | Wt 165.0 lb

## 2022-07-01 DIAGNOSIS — N486 Induration penis plastica: Secondary | ICD-10-CM

## 2022-07-01 DIAGNOSIS — E785 Hyperlipidemia, unspecified: Secondary | ICD-10-CM

## 2022-07-01 DIAGNOSIS — I679 Cerebrovascular disease, unspecified: Secondary | ICD-10-CM | POA: Diagnosis not present

## 2022-07-01 LAB — MICROALBUMIN, URINE: Microalb, Ur: 3.2

## 2022-07-01 MED ORDER — CLOPIDOGREL BISULFATE 75 MG PO TABS: 75.0000 mg | ORAL_TABLET | Freq: Every day | ORAL | 1 refills | Status: DC

## 2022-07-01 MED ORDER — ATORVASTATIN CALCIUM 80 MG PO TABS: 80.0000 mg | ORAL_TABLET | Freq: Every day | ORAL | 1 refills | Status: DC

## 2022-07-01 NOTE — Telephone Encounter (Signed)
I called pt's wife back, she did not answer. I left a message for her to return the call, unsure of what she needs to discuss

## 2022-07-01 NOTE — Telephone Encounter (Signed)
Copied from CRM [id number]. Topic: General - Other >> Jul 01, 2022  1:07 PM Everette C wrote: Reason for CRM: The patient's wife would like to speak with Delice Bison when possible to discuss the patient's recent visit to the office   Please contact further when possible

## 2022-07-01 NOTE — Progress Notes (Signed)
Date:  07/01/2022   Name:  Maxwell Clark   DOB:  24-Jun-1956   MRN:  [id number]   Chief Complaint: cerebral vascular disease, Hyperlipidemia, and Diabetes (Needs micro and foot exam- sees Honor Junes in Sept. For A1C)  Hyperlipidemia This is a chronic problem. The current episode started more than 1 year ago. The problem is controlled. Recent lipid tests were reviewed and are normal. He has no history of chronic renal disease, diabetes, hypothyroidism, liver disease, obesity or nephrotic syndrome. There are no known factors aggravating his hyperlipidemia. Pertinent negatives include no chest pain, focal sensory loss, focal weakness, leg pain, myalgias or shortness of breath. Current antihyperlipidemic treatment includes statins. The current treatment provides moderate improvement of lipids. There are no compliance problems.  Risk factors for coronary artery disease include dyslipidemia and hypertension.  Diabetes He presents for his follow-up diabetic visit. He has type 2 diabetes mellitus. His disease course has been stable. There are no hypoglycemic associated symptoms. Pertinent negatives for hypoglycemia include no confusion. Pertinent negatives for diabetes include no blurred vision, no chest pain, no fatigue, no polydipsia and no polyuria. There are no hypoglycemic complications. Symptoms are stable. There are no diabetic complications. Current diabetic treatment includes oral agent (triple therapy) and insulin injections. He is following a generally healthy diet. He participates in exercise daily. His breakfast blood glucose is taken between 8-9 am. His breakfast blood glucose range is generally 110-130 mg/dl. An ACE inhibitor/angiotensin II receptor blocker is not being taken.  Neurologic Problem The patient's pertinent negatives include no focal sensory loss or focal weakness. Primary symptoms comment: cerebravascular disease. The current episode started more than 1 year ago. The problem has  been gradually improving since onset. There was no focality noted. Pertinent negatives include no abdominal pain, chest pain, confusion, fatigue or shortness of breath. The treatment provided mild relief. There is no history of liver disease.    Lab Results  Component Value Date   NA 138 12/24/2019   K 4.2 12/24/2019   CO2 22 12/24/2019   GLUCOSE 84 12/24/2019   BUN 16 12/24/2019   CREATININE 0.82 12/24/2019   CALCIUM 9.7 12/24/2019   GFRNONAA 94 12/24/2019   Lab Results  Component Value Date   CHOL 171 10/08/2021   HDL 89 10/08/2021   LDLCALC 72 10/08/2021   TRIG 45 10/08/2021   CHOLHDL 1.7 10/13/2018   Lab Results  Component Value Date   TSH 6.210 (H) 10/15/2014   Lab Results  Component Value Date   HGBA1C 9.2 03/25/2022   Lab Results  Component Value Date   WBC 6.9 12/24/2019   HGB 16.0 12/24/2019   HCT 45.0 12/24/2019   MCV 91 12/24/2019   PLT 249 12/24/2019   Lab Results  Component Value Date   ALT 21 10/15/2014   AST 21 10/15/2014   ALKPHOS 71 10/15/2014   BILITOT 0.5 10/15/2014   No results found for: "25OHVITD2", "25OHVITD3", "VD25OH"   Review of Systems  Constitutional:  Negative for fatigue.  Eyes:  Negative for blurred vision.  Respiratory:  Negative for shortness of breath.   Cardiovascular:  Negative for chest pain.  Gastrointestinal:  Negative for abdominal pain.  Endocrine: Negative for polydipsia and polyuria.  Musculoskeletal:  Negative for myalgias.  Neurological:  Negative for focal weakness.  Psychiatric/Behavioral:  Negative for confusion.     Patient Active Problem List   Diagnosis Date Noted   Sinus infection 05/01/2020   Corneal scar, right eye 09/29/2018  Routine general medical examination at a health care facility 03/18/2015   Type II diabetes mellitus with neurological manifestations ([place]) 03/18/2015   Ischemic stroke ([place]) 03/18/2015   Combined fat and carbohydrate induced hyperlipemia 03/18/2015   Cerebral infarction  due to embolism of left middle cerebral artery ([place]) 02/26/2015   DM (diabetes mellitus) ([place]) 02/26/2015   HLD (hyperlipidemia) 02/26/2015   Dyslipidemia, goal LDL below 70 10/18/2014   Acute ischemic stroke ([place]) 10/15/2014   Aphasia due to stroke 10/15/2014   Cerebral vascular disease 10/15/2014    Allergies  Allergen Reactions   Penicillins     unknown    Past Surgical History:  Procedure Laterality Date   COLONOSCOPY WITH PROPOFOL N/A 05/16/2020   Procedure: COLONOSCOPY WITH PROPOFOL;  Surgeon: Jonathon Bellows, MD;  Location: [facility] ENDOSCOPY;  Service: Gastroenterology;  Laterality: N/A;   LOOP RECORDER IMPLANT N/A 10/18/2014   Procedure: LOOP RECORDER IMPLANT;  Surgeon: Deboraha Sprang, MD;  Location: [facility] CATH LAB;  Service: Cardiovascular;  Laterality: N/A;   TEE WITHOUT CARDIOVERSION N/A 10/18/2014   Procedure: TRANSESOPHAGEAL ECHOCARDIOGRAM (TEE);  Surgeon: Sanda Klein, MD;  Location: [facility] ENDOSCOPY;  Service: Cardiovascular;  Laterality: N/A;  loop recorder to follow     Social History   Tobacco Use   Smoking status: Never   Smokeless tobacco: Former    Types: Chew   Tobacco comments:    quit around 2012  Substance Use Topics   Alcohol use: Yes    Alcohol/week: 1.0 standard drink of alcohol    Types: 1 Cans of beer per week    Comment: Occasional    Drug use: No     Medication list has been reviewed and updated.  Current Meds  Medication Sig   atorvastatin (LIPITOR) 80 MG tablet Take 1 tablet (80 mg total) by mouth daily.   B-D ULTRAFINE III SHORT PEN 31G X 8 MM MISC Inject 1 Syringe into the skin daily.   clopidogrel (PLAVIX) 75 MG tablet TAKE 1 TABLET BY MOUTH EVERY DAY   Continuous Blood Gluc Sensor (FREESTYLE LIBRE 2 SENSOR) MISC Use 1 kit every 14 (fourteen) days for glucose monitoring   Continuous Blood Gluc Sensor (FREESTYLE LIBRE 2 SENSOR) MISC    glimepiride (AMARYL) 4 MG tablet Take 1 mg by mouth daily. O'Connell   Insulin Pen Needle (B-D ULTRAFINE III  SHORT PEN) 31G X 8 MM MISC USE ONCE DAILY   pioglitazone (ACTOS) 15 MG tablet Take 15 mg by mouth daily. O'Connell   SYNJARDY XR 12.03-999 MG TB24 Take 2 tablets by mouth every morning. O'Connell       07/01/2022   11:07 AM 10/08/2021    2:24 PM 10/08/2021    2:17 PM 04/27/2021    3:37 PM  GAD 7 : Generalized Anxiety Score  Nervous, Anxious, on Edge 0 0 0 0  Control/stop worrying 0 0 0 0  Worry too much - different things 0 0 0 0  Trouble relaxing 0 0 0 0  Restless 0 0 0 0  Easily annoyed or irritable 0 0 0 0  Afraid - awful might happen 0 0 0 0  Total GAD 7 Score 0 0 0 0  Anxiety Difficulty Not difficult at all Not difficult at all Not difficult at all        07/01/2022   11:06 AM 10/08/2021    2:23 PM 10/08/2021    2:17 PM  Depression screen PHQ 2/9  Decreased Interest 0 0 0  Down, Depressed, Hopeless  0 0 0  PHQ - 2 Score 0 0 0  Altered sleeping 0 0 0  Tired, decreased energy 0 0 0  Change in appetite 0 0 0  Feeling bad or failure about yourself  0 0 0  Trouble concentrating 0 0 0  Moving slowly or fidgety/restless 0 0 0  Suicidal thoughts 0 0 0  PHQ-9 Score 0 0 0  Difficult doing work/chores Not difficult at all Not difficult at all Not difficult at all    BP Readings from Last 3 Encounters:  07/01/22 110/60  10/08/21 138/72  05/07/21 120/80    Physical Exam Vitals and nursing note reviewed.  HENT:     Head: Normocephalic.     Right Ear: Tympanic membrane and external ear normal.     Left Ear: Tympanic membrane and external ear normal.     Nose: Nose normal. No congestion or rhinorrhea.     Mouth/Throat:     Mouth: Mucous membranes are moist.     Pharynx: No oropharyngeal exudate or posterior oropharyngeal erythema.  Eyes:     General: No scleral icterus.       Right eye: No discharge.        Left eye: No discharge.     Conjunctiva/sclera: Conjunctivae normal.     Pupils: Pupils are equal, round, and reactive to light.  Neck:     Thyroid: No  thyromegaly.     Vascular: No JVD.     Trachea: No tracheal deviation.  Cardiovascular:     Rate and Rhythm: Normal rate and regular rhythm.     Heart sounds: Normal heart sounds. No murmur heard.    No friction rub. No gallop.  Pulmonary:     Effort: No respiratory distress.     Breath sounds: Normal breath sounds. No wheezing, rhonchi or rales.  Abdominal:     General: Bowel sounds are normal.     Palpations: Abdomen is soft. There is no mass.     Tenderness: There is no abdominal tenderness. There is no guarding or rebound.  Musculoskeletal:        General: No tenderness. Normal range of motion.     Cervical back: Normal range of motion and neck supple.  Lymphadenopathy:     Cervical: No cervical adenopathy.  Skin:    General: Skin is warm.     Findings: No rash.  Neurological:     Mental Status: He is alert and oriented to person, place, and time.     Cranial Nerves: No cranial nerve deficit.     Deep Tendon Reflexes: Reflexes are normal and symmetric.     Wt Readings from Last 3 Encounters:  07/01/22 165 lb (74.8 kg)  10/08/21 165 lb (74.8 kg)  05/07/21 161 lb (73 kg)    BP 110/60   Pulse 64   Ht _0  (1.778 m)   Wt 165 lb (74.8 kg)   BMI 23.68 kg/m   Assessment and Plan:  1. Dyslipidemia, goal LDL below 70 Chronic.  Controlled.  Stable.  Currently on atorvastatin 80 mg once a day.  Will check lipid panel for current level of control as well as microalbuminuria. - atorvastatin (LIPITOR) 80 MG tablet; Take 1 tablet (80 mg total) by mouth daily.  Dispense: 90 tablet; Refill: 1 - Lipid Panel With LDL/HDL Ratio - Microalbumin, urine  2. Cerebral vascular disease Chronic.  Controlled.  Stable.  Needs asymptomatic no further neurologic involvement.  Will continue Plavix 75 mg once a  day.  Will check CMP for electrolytes and GFR. - clopidogrel (PLAVIX) 75 MG tablet; Take 1 tablet (75 mg total) by mouth daily.  Dispense: 90 tablet; Refill: 1 - Comprehensive  Metabolic Panel (CMET)    Otilio Miu, MD

## 2022-07-01 NOTE — Addendum Note (Signed)
Addended by: Everitt Amber on: 07/01/2022 03:58 PM   Modules accepted: Orders

## 2022-07-02 LAB — COMPREHENSIVE METABOLIC PANEL
ALT: 27 IU/L (ref 0–44)
AST: 26 IU/L (ref 0–40)
Albumin/Globulin Ratio: 1.6 (ref 1.2–2.2)
Albumin: 4.5 g/dL (ref 3.9–4.9)
Alkaline Phosphatase: 74 IU/L (ref 44–121)
BUN/Creatinine Ratio: 15 (ref 10–24)
BUN: 16 mg/dL (ref 8–27)
Bilirubin Total: 0.3 mg/dL (ref 0.0–1.2)
CO2: 21 mmol/L (ref 20–29)
Calcium: 9.1 mg/dL (ref 8.6–10.2)
Chloride: 101 mmol/L (ref 96–106)
Creatinine, Ser: 1.1 mg/dL (ref 0.76–1.27)
Globulin, Total: 2.8 g/dL (ref 1.5–4.5)
Glucose: 254 mg/dL — ABNORMAL HIGH (ref 70–99)
Potassium: 4.3 mmol/L (ref 3.5–5.2)
Sodium: 138 mmol/L (ref 134–144)
Total Protein: 7.3 g/dL (ref 6.0–8.5)
eGFR: 74 mL/min/{1.73_m2} (ref >59)

## 2022-07-02 LAB — LIPID PANEL WITH LDL/HDL RATIO
Cholesterol, Total: 155 mg/dL (ref 100–199)
HDL: 91 mg/dL (ref >39)
LDL Chol Calc (NIH): 51 mg/dL (ref 0–99)
LDL/HDL Ratio: 0.6 ratio (ref 0.0–3.6)
Triglycerides: 64 mg/dL (ref 0–149)
VLDL Cholesterol Cal: 13 mg/dL (ref 5–40)

## 2022-07-02 LAB — MICROALBUMIN, URINE: Microalbumin, Urine: 3.2 ug/mL

## 2022-07-16 ENCOUNTER — Encounter: Payer: Self-pay | Admitting: Urology

## 2022-07-16 ENCOUNTER — Ambulatory Visit (INDEPENDENT_AMBULATORY_CARE_PROVIDER_SITE_OTHER): Payer: Medicare Other | Admitting: Urology

## 2022-07-16 VITALS — BP 132/71 | HR 61 | Ht 70.0 in | Wt 160.0 lb

## 2022-07-16 DIAGNOSIS — N486 Induration penis plastica: Secondary | ICD-10-CM

## 2022-07-16 NOTE — Progress Notes (Signed)
07/16/2022 2:09 PM   Maxwell Clark [birth year] [id number]  Referring provider: Juline Patch, MD [address] [place],  [place] [address]  Chief Complaint  Patient presents with   Abnormal Penile Curvature    HPI: Maxwell Clark is a 66 y.o. male referred for evaluation of Peyronie's disease.  1 year history of penile curvature with erection Denies pain Describes curvature as dorsally and midshaft.  Estimates curvature at 30 degrees No hourglass deformity Denies erectile dysfunction He and his wife are not sexually active though he does not fear the curvature would interfere with successful intercourse No bothersome LUTS   PMH: Past Medical History:  Diagnosis Date   Diabetes mellitus without complication ([place])    Hyperlipidemia    Stroke ([place]) 10/15/2014    Surgical History: Past Surgical History:  Procedure Laterality Date   COLONOSCOPY WITH PROPOFOL N/A 05/16/2020   Procedure: COLONOSCOPY WITH PROPOFOL;  Surgeon: Jonathon Bellows, MD;  Location[facility] END[facility]COPY;  Service: Gastroenterology;  Laterality: N/A;   LOOP RECORDER IMPLANT N/A 10/18/2014   Procedure: LOOP RECORDER IMPLANT;  Surgeon: Deboraha Sprang, MD;  Location: [facility] CATH LAB;  Service: Cardiovascular;  Laterality: N/A;   TEE WITHOUT CARDIOVERSION N/A 10/18/2014   Procedure: TRANSESOPHAGEAL ECHOCARDIOGRAM (TEE);  Surgeon: Sanda Klein, MD;  Location: [facility] ENDOSCOPY;  Service: Cardiovascular;  Laterality: N/A;  loop recorder to follow     Home Medications:  Allergies as of 07/16/2022       Reactions   Penicillins    unknown        Medication List        Accurate as of July 16, 2022  2:09 PM. If you have any questions, ask your nurse or doctor.          atorvastatin 80 MG tablet Commonly known as: LIPITOR Take 1 tablet (80 mg total) by mouth daily.   B-D ULTRAFINE III SHORT PEN 31G X 8 MM Misc Generic drug: Insulin Pen Needle USE ONCE DAILY   B-D ULTRAFINE III SHORT PEN 31G  X 8 MM Misc Generic drug: Insulin Pen Needle Inject 1 Syringe into the skin daily.   clopidogrel 75 MG tablet Commonly known as: PLAVIX Take 1 tablet (75 mg total) by mouth daily.   FreeStyle Libre 2 Sensor Misc Use 1 kit every 14 (fourteen) days for glucose monitoring   FreeStyle Libre 2 Sensor Misc   glimepiride 4 MG tablet Commonly known as: AMARYL Take 1 mg by mouth daily. O'Connell   Insulin Glargine-Lixisenatide 100-33 UNT-MCG/ML Sopn Inject 18 Units into the skin. Dr Honor Junes   pioglitazone 15 MG tablet Commonly known as: ACTOS Take 15 mg by mouth daily. O'Connell   Synjardy XR 12.03-999 MG Tb24 Generic drug: Empagliflozin-metFORMIN HCl ER Take 2 tablets by mouth every morning. O'Connell        Allergies:  Allergies  Allergen Reactions   Penicillins     unknown    Family History: Family History  Problem Relation Age of Onset   Atrial fibrillation Mother    Hypertension Mother    Lung cancer Father    Kidney cancer Father     Social History:  reports that he has never smoked. He has quit using smokeless tobacco.  His smokeless tobacco use included chew. He reports current alcohol use of about 1.0 standard drink of alcohol per week. He reports that he does not use drugs.   Physical Exam: BP 132/71   Pulse 61   Ht $R'5\' 10"'hr$  (  1.778 m)   Wt 160 lb (72.6 kg)   BMI 22.96 kg/m   Constitutional:  Alert, No acute distress. HEENT: [place] AT Respiratory: Normal respiratory effort, no increased work of breathing. GU: Stretched penile length normal.  Elongated dorsal plaque extending from base to distal shaft.  Testes descended bilaterally without masses or tenderness Skin: No rashes, bruises or suspicious lesions. Psychiatric: Normal mood and affect.   Assessment & Plan:    1.  Peyronie's disease Discussed the pathophysiology of Peyronie's disease though the cause is unknown He and his wife are not sexually active and we discussed indications for treatment  would be inability to have successful intercourse secondary to curvature Xiaflex was discussed briefly and he was given literature   Abbie Sons, MD  [place] [address], [place], [place] [address] [phone]

## 2022-07-29 DIAGNOSIS — E785 Hyperlipidemia, unspecified: Secondary | ICD-10-CM | POA: Diagnosis not present

## 2022-07-29 DIAGNOSIS — E1165 Type 2 diabetes mellitus with hyperglycemia: Secondary | ICD-10-CM | POA: Diagnosis not present

## 2022-07-29 DIAGNOSIS — E1169 Type 2 diabetes mellitus with other specified complication: Secondary | ICD-10-CM | POA: Diagnosis not present

## 2022-07-29 DIAGNOSIS — Z794 Long term (current) use of insulin: Secondary | ICD-10-CM | POA: Diagnosis not present

## 2022-07-29 LAB — HEMOGLOBIN A1C: Hemoglobin A1C: 8.4

## 2022-09-24 DIAGNOSIS — L570 Actinic keratosis: Secondary | ICD-10-CM | POA: Diagnosis not present

## 2022-09-24 DIAGNOSIS — D2261 Melanocytic nevi of right upper limb, including shoulder: Secondary | ICD-10-CM | POA: Diagnosis not present

## 2022-09-24 DIAGNOSIS — D225 Melanocytic nevi of trunk: Secondary | ICD-10-CM | POA: Diagnosis not present

## 2022-09-24 DIAGNOSIS — D2262 Melanocytic nevi of left upper limb, including shoulder: Secondary | ICD-10-CM | POA: Diagnosis not present

## 2022-09-24 DIAGNOSIS — X32XXXA Exposure to sunlight, initial encounter: Secondary | ICD-10-CM | POA: Diagnosis not present

## 2022-09-24 DIAGNOSIS — L821 Other seborrheic keratosis: Secondary | ICD-10-CM | POA: Diagnosis not present

## 2022-10-29 ENCOUNTER — Ambulatory Visit (INDEPENDENT_AMBULATORY_CARE_PROVIDER_SITE_OTHER): Payer: Medicare Other

## 2022-10-29 DIAGNOSIS — Z0000 Encounter for general adult medical examination without abnormal findings: Secondary | ICD-10-CM | POA: Diagnosis not present

## 2022-10-29 NOTE — Patient Instructions (Signed)
Health Maintenance, Male Adopting a healthy lifestyle and getting preventive care are important in promoting health and wellness. Ask your health care provider about: The right schedule for you to have regular tests and exams. Things you can do on your own to prevent diseases and keep yourself healthy. What should I know about diet, weight, and exercise? Eat a healthy diet  Eat a diet that includes plenty of vegetables, fruits, low-fat dairy products, and lean protein. Do not eat a lot of foods that are high in solid fats, added sugars, or sodium. Maintain a healthy weight Body mass index (BMI) is a measurement that can be used to identify possible weight problems. It estimates body fat based on height and weight. Your health care provider can help determine your BMI and help you achieve or maintain a healthy weight. Get regular exercise Get regular exercise. This is one of the most important things you can do for your health. Most adults should: Exercise for at least 150 minutes each week. The exercise should increase your heart rate and make you sweat (moderate-intensity exercise). Do strengthening exercises at least twice a week. This is in addition to the moderate-intensity exercise. Spend less time sitting. Even light physical activity can be beneficial. Watch cholesterol and blood lipids Have your blood tested for lipids and cholesterol at 66 years of age, then have this test every 5 years. You may need to have your cholesterol levels checked more often if: Your lipid or cholesterol levels are high. You are older than 66 years of age. You are at high risk for heart disease. What should I know about cancer screening? Many types of cancers can be detected early and may often be prevented. Depending on your health history and family history, you may need to have cancer screening at various ages. This may include screening for: Colorectal cancer. Prostate cancer. Skin cancer. Lung  cancer. What should I know about heart disease, diabetes, and high blood pressure? Blood pressure and heart disease High blood pressure causes heart disease and increases the risk of stroke. This is more likely to develop in people who have high blood pressure readings or are overweight. Talk with your health care provider about your target blood pressure readings. Have your blood pressure checked: Every 3-5 years if you are 18-39 years of age. Every year if you are 40 years old or older. If you are between the ages of 65 and 75 and are a current or former smoker, ask your health care provider if you should have a one-time screening for abdominal aortic aneurysm (AAA). Diabetes Have regular diabetes screenings. This checks your fasting blood sugar level. Have the screening done: Once every three years after age 45 if you are at a normal weight and have a low risk for diabetes. More often and at a younger age if you are overweight or have a high risk for diabetes. What should I know about preventing infection? Hepatitis B If you have a higher risk for hepatitis B, you should be screened for this virus. Talk with your health care provider to find out if you are at risk for hepatitis B infection. Hepatitis C Blood testing is recommended for: Everyone born from 1945 through 1965. Anyone with known risk factors for hepatitis C. Sexually transmitted infections (STIs) You should be screened each year for STIs, including gonorrhea and chlamydia, if: You are sexually active and are younger than 66 years of age. You are older than 66 years of age and your   health care provider tells you that you are at risk for this type of infection. Your sexual activity has changed since you were last screened, and you are at increased risk for chlamydia or gonorrhea. Ask your health care provider if you are at risk. Ask your health care provider about whether you are at high risk for HIV. Your health care provider  may recommend a prescription medicine to help prevent HIV infection. If you choose to take medicine to prevent HIV, you should first get tested for HIV. You should then be tested every 3 months for as long as you are taking the medicine. Follow these instructions at home: Alcohol use Do not drink alcohol if your health care provider tells you not to drink. If you drink alcohol: Limit how much you have to 0-2 drinks a day. Know how much alcohol is in your drink. In the [place] one drink equals one 12 oz bottle of beer (355 mL), one 5 oz glass of wine (148 mL), or one 1 oz glass of hard liquor (44 mL). Lifestyle Do not use any products that contain nicotine or tobacco. These products include cigarettes, chewing tobacco, and vaping devices, such as e-cigarettes. If you need help quitting, ask your health care provider. Do not use street drugs. Do not share needles. Ask your health care provider for help if you need support or information about quitting drugs. General instructions Schedule regular health, dental, and eye exams. Stay current with your vaccines. Tell your health care provider if: You often feel depressed. You have ever been abused or do not feel safe at home. Summary Adopting a healthy lifestyle and getting preventive care are important in promoting health and wellness. Follow your health care provider's instructions about healthy diet, exercising, and getting tested or screened for diseases. Follow your health care provider's instructions on monitoring your cholesterol and blood pressure. This information is not intended to replace advice given to you by your health care provider. Make sure you discuss any questions you have with your health care provider. Document Revised: 03/30/2021 Document Reviewed: 03/30/2021 Elsevier Patient Education  2023 [employer]  

## 2022-10-29 NOTE — Progress Notes (Signed)
I connected with  Maxwell Clark on 10/29/22 by a audio enabled telemedicine application and verified that I am speaking with the correct person using two identifiers.  Patient Location: Home  Provider Location: Office/Clinic  I discussed the limitations of evaluation and management by telemedicine. The patient expressed understanding and agreed to proceed.   Subjective:   Maxwell Clark is a 66 y.o. male who presents for Medicare Annual/Subsequent preventive examination.  Review of Systems    Per HPI unless specifically indicated below.  Cardiac Risk Factors include: advanced age (>33mn, >>14women);male gender, history of stroke, hyperlipidemia, and Type 2 Diabetes mellitus.        Objective:       07/16/2022    1:33 PM 07/01/2022   11:00 AM 10/08/2021    2:21 PM  Vitals with BMI  Height 5' 10" 5' 10" 5' 10"  Weight 160 lbs 165 lbs 165 lbs  BMI 22.96 210.93223.55 Systolic [id number] Diastolic 71 60 72  Pulse 61 64 63    There were no vitals filed for this visit. There is no height or weight on file to calculate BMI.     10/29/2022    8:24 AM 05/16/2020   10:24 AM 10/23/2014    9:48 AM 10/22/2014    9:39 AM 10/16/2014   10:03 AM  Advanced Directives  Does Patient Have a Medical Advance Directive? _0   Would patient like information on creating a medical advance directive? No - Patient declined  No - patient declined information Yes - Educational materials given No - patient declined information    Current Medications (verified) Outpatient Encounter Medications as of 10/29/2022  Medication Sig   atorvastatin (LIPITOR) 80 MG tablet Take 1 tablet (80 mg total) by mouth daily.   B-D ULTRAFINE III SHORT PEN 31G X 8 MM MISC Inject 1 Syringe into the skin daily.   clopidogrel (PLAVIX) 75 MG tablet Take 1 tablet (75 mg total) by mouth daily.   Continuous Blood Gluc Sensor (FREESTYLE LIBRE 2 SENSOR) MISC Use 1 kit every 14 (fourteen) days for glucose  monitoring   Continuous Blood Gluc Sensor (FREESTYLE LIBRE 2 SENSOR) MISC    glimepiride (AMARYL) 4 MG tablet Take 1 mg by mouth daily. O'Connell   Insulin Glargine-Lixisenatide 100-33 UNT-MCG/ML SOPN Inject 22 Units into the skin. Dr OHonor Junes  Insulin Pen Needle (B-D ULTRAFINE III SHORT PEN) 31G X 8 MM MISC USE ONCE DAILY   pioglitazone (ACTOS) 15 MG tablet Take 15 mg by mouth daily. O'Connell   SYNJARDY XR 12.03-999 MG TB24 Take 2 tablets by mouth every morning. O'Connell   No facility-administered encounter medications on file as of 10/29/2022.    Allergies (verified) Penicillins   History: Past Medical History:  Diagnosis Date   [place] mellitus without complication [place]    Hyperlipidemia    [place] ([place] 10/[place]/2014   Past Surgical History:  Procedure Laterality Date   COLONOSCOPY WITH PROPOFOL N/A 05/16/2020   Procedure: COLONOSCOPY WITH PROPOFOL;  Surgeon: AJonathon Bellows MD;  [facility];  Service: Gastroenterology;  Laterality: N/A;   LOOP RECORDER IMPLANT N/A 10/18/2014   Procedure: LOOP RECORDER IMPLANT;  Surgeon: SDeboraha Sprang MD;  [facility];  Service: Cardiovascular;  Laterality: N/A;   TEE WITHOUT CARDIOVERSION N/A 10/18/2014   Procedure: TRANSESOPHAGEAL ECHOCARDIOGRAM (TEE);  Surgeon: MSanda Klein MD;  [facility];  Service: Cardiovascular;  Laterality: N/A;  loop recorder to follow  Family History  Problem Relation Age of Onset   Atrial fibrillation Mother    Hypertension Mother    Lung cancer Father    Kidney cancer Father    Social History   Socioeconomic History   Marital status: Married    Spouse name: Larena Glassman   Number of children: 2   Years of education: HS   Highest education level: Not on file  Occupational History    Employer: OTHER    Comment: Self-employed landscaper  Tobacco Use   Smoking status: Never   Smokeless tobacco: Former    Types: Chew   Tobacco comments:    quit around 2012  Substance and  Sexual Activity   Alcohol use: Yes    Alcohol/week: 1.0 standard drink of alcohol    Types: 1 Cans of beer per week    Comment: Occasional    Drug use: No   Sexual activity: Yes  Other Topics Concern   Not on file  Social History Narrative   Patient lives at home with spouse.   Caffeine Use: 10 cups daily   Social Determinants of Health   Financial Resource Strain: Low Risk  (10/29/2022)   Overall Financial Resource Strain (CARDIA)    Difficulty of Paying Living Expenses: Not hard at all  Food Insecurity: No Food Insecurity (10/29/2022)   Hunger Vital Sign    Worried About Running Out of Food in the Last Year: Never true    Ran Out of Food in the Last Year: Never true  Transportation Needs: No Transportation Needs (10/29/2022)   PRAPARE - Hydrologist (Medical): No    Lack of Transportation (Non-Medical): No  Physical Activity: Inactive (10/29/2022)   Exercise Vital Sign    Days of Exercise per Week: 0 days    Minutes of Exercise per Session: 0 min  Stress: No Stress Concern Present (10/29/2022)   [place]    Feeling of Stress : Not at all  Social Connections: [place] (10/29/2022)   Social Connection and Isolation Panel [NHANES]    Frequency of Communication with Friends and Family: More than three times a week    Frequency of Social Gatherings with Friends and Family: Once a week    Attends Religious Services: More than 4 times per year    Active Member of [employer] or Organizations: Yes    Attends Music therapist: More than 4 times per year    Marital Status: Married    Tobacco Counseling Counseling given: Not Answered Tobacco comments: quit around 2012   Clinical Intake:  Pre-visit preparation completed: No  Pain : No/denies pain     Nutritional Status: BMI of 19-24  Normal Nutritional Risks: None Diabetes: Yes CBG done?: Yes CBG resulted in  Enter/ Edit results?: No Did pt. bring in CBG monitor from home?: Yes Glucose Meter Downloaded?: Yes  How often do you need to have someone help you when you read instructions, pamphlets, or other written materials from your doctor or pharmacy?: 1 - Never  Diabetic?Nutrition Risk Assessment:  Has the patient had any N/V/D within the last 2 months?  No  Does the patient have any non-healing wounds?  No  Has the patient had any unintentional weight loss or weight gain?  No   Diabetes:  Is the patient diabetic?  Yes  If diabetic, was a CBG obtained today?  Yes  Did the patient bring in their glucometer from home?  No  How often do you monitor your CBG's? Continuously .   Financial Strains and Diabetes Management:  Are you having any financial strains with the device, your supplies or your medication? No .  Does the patient want to be seen by Chronic Care Management for management of their diabetes?  No  Would the patient like to be referred to a Nutritionist or for Diabetic Management?  No   Diabetic Exams:  Diabetic Eye Exam: Completed 12/03/2020 Diabetic Foot Exam: Completed 07/01/2022    Interpreter Needed?: No  Information entered by :: Donnie Mesa, [place]   Activities of Daily Living    10/29/2022    8:14 AM  In your present state of health, do you have any difficulty performing the following activities:  Hearing? 1  Vision? 0  Difficulty concentrating or making decisions? 1  Walking or climbing stairs? 0  Dressing or bathing? 0  Doing errands, shopping? 0    Patient Care Team: Juline Patch, MD as PCP - General (Family Medicine)  Indicate any recent Medical Services you may have received from other than [facility] providers in the past year (date may be approximate).    No hospitalization in the past 12 months. Assessment:   This is a routine wellness examination for [place].  Hearing/Vision screen Admits to some mild hearing loss that he associates with  age. Denies any changes in his vision. Annual Eye Exam, [facility]  Dietary issues and exercise activities discussed: Current Exercise Habits: The patient has a physically strenuous job, but has no regular exercise apart from work., Exercise limited by: None identified   Goals Addressed             This Visit's Progress    Stay Active and Independent       Why is this important?   Regular activity or exercise is important to managing back pain.  Activity helps to keep your muscles strong.  You will sleep better and feel more relaxed.  You will have more energy and feel less stressed.  If you are not active now, start slowly. Little changes make a big difference.  Rest, but not too much.  Stay as active as you can and listen to your body's signals.            Depression Screen    10/29/2022    8:13 AM 07/01/2022   11:06 AM 10/08/2021    2:23 PM 10/08/2021    2:17 PM 04/27/2021    3:37 PM 03/19/2021    4:22 PM 09/25/2020    8:50 AM  PHQ 2/9 Scores  PHQ - 2 Score 0 0 0 0 0 0 0  PHQ- 9 Score  0 0 0 0 0 0    Fall Risk    10/29/2022    8:13 AM 07/01/2022   11:06 AM 10/08/2021    2:24 PM 10/08/2021    2:17 PM 09/25/2020    8:50 AM  Fall Risk   Falls in the past year? 0 0 0 0 0  Number falls in past yr: 0 0 0 0   Injury with Fall? 0 0 0 0   Risk for fall due to : No Fall Risks No Fall Risks No Fall Risks No Fall Risks   Follow up _0     FALL RISK PREVENTION PERTAINING TO THE HOME:  Any stairs in or around the home? Yes  If so, are there any without handrails? No  Home free of loose throw rugs in walkways, pet beds, electrical cords, etc? Yes  Adequate lighting in your home to reduce risk of falls? Yes   ASSISTIVE DEVICES UTILIZED TO PREVENT FALLS:  Life alert? No  Use of a cane, walker or w/c? No  Grab bars in the bathroom? No   Shower chair or bench in shower? No  Elevated toilet seat or a handicapped toilet? Yes   TIMED UP AND GO:  Was the test performed?  unable to perform, virtual telephone visit .    Cognitive Function:        10/29/2022    8:14 AM  6CIT Screen  What Year? 0 points  What month? 0 points  What time? 0 points  Count back from 20 0 points  Months in reverse 0 points  Repeat phrase 0 points  Total Score 0 points    Immunizations Immunization History  Administered Date(s) Administered   Influenza,inj,Quad PF,6+ Mos 11/02/2017, 12/24/2019, 09/25/2020   Influenza-Unspecified 09/29/2018   PFIZER(Purple Top)SARS-COV-2 Vaccination 02/15/2020, 03/07/2020, 11/25/2020   Pneumococcal Polysaccharide-23 10/18/2014    TDAP status: Due, Education has been provided regarding the importance of this vaccine. Advised may receive this vaccine at local pharmacy or Health Dept. Aware to provide a copy of the vaccination record if obtained from local pharmacy or Health Dept. Verbalized acceptance and understanding.  Flu Vaccine status: Due, Education has been provided regarding the importance of this vaccine. Advised may receive this vaccine at local pharmacy or Health Dept. Aware to provide a copy of the vaccination record if obtained from local pharmacy or Health Dept. Verbalized acceptance and understanding.  Pneumococcal vaccine status: Due, Education has been provided regarding the importance of this vaccine. Advised may receive this vaccine at local pharmacy or Health Dept. Aware to provide a copy of the vaccination record if obtained from local pharmacy or Health Dept. Verbalized acceptance and understanding.  Covid-19 vaccine status: Information provided on how to obtain vaccines.   Qualifies for Shingles Vaccine? Yes   Zostavax completed No   Shingrix Completed?: No.    Education has been provided regarding the importance of this vaccine. Patient has been advised to call insurance company to  determine out of pocket expense if they have not yet received this vaccine. Advised may also receive vaccine at local pharmacy or Health Dept. Verbalized acceptance and understanding.  Screening Tests Health Maintenance  Topic Date Due   DTaP/Tdap/Td (1 - Tdap) Never done   Zoster Vaccines- Shingrix (1 of 2) Never done   OPHTHALMOLOGY EXAM  12/03/2021   Diabetic kidney evaluation - Urine ACR  02/09/2022   INFLUENZA VACCINE  06/22/2022   COVID-19 Vaccine (4 - 2023-24 season) 07/23/2022   HEMOGLOBIN A1C  09/25/2022   Pneumonia Vaccine 67+ Years old (2 - PCV) 07/02/2023 (Originally 09/06/2021)   Hepatitis C Screening  07/02/2023 (Originally 09/06/1974)   Diabetic kidney evaluation - GFR measurement  07/02/2023   FOOT EXAM  07/02/2023   Medicare Annual Wellness (AWV)  10/30/2023   COLONOSCOPY (Pts 45-67yr Insurance coverage will need to be confirmed)  05/16/2030   HPV VACCINES  Aged Out    Health Maintenance  Health Maintenance Due  Topic Date Due   DTaP/Tdap/Td (1 - Tdap) Never done   Zoster Vaccines- Shingrix (1 of 2) Never done   OPHTHALMOLOGY EXAM  12/03/2021   Diabetic kidney evaluation - Urine ACR  02/09/2022   INFLUENZA VACCINE  06/22/2022  COVID-19 Vaccine (4 - 2023-24 season) 07/23/2022   HEMOGLOBIN A1C  09/25/2022    Colorectal cancer screening: Type of screening: Colonoscopy. Completed 05/16/2020. Repeat every 10 years  Lung Cancer Screening: (Low Dose CT Chest recommended if Age 46-80 years, 30 pack-year currently smoking OR have quit w/in 15years.) does not qualify.   Lung Cancer Screening Referral: not applicable   Additional Screening:  Hepatitis C Screening: does not qualify  Vision Screening: Recommended annual ophthalmology exams for early detection of glaucoma and other disorders of the eye. Is the patient up to date with their annual eye exam?  Yes Who is the provider or what is the name of the office in which the patient attends annual eye exams?  [facility]  If pt is not established with a provider, would they like to be referred to a provider to establish care? No .   Dental Screening: Recommended annual dental exams for proper oral hygiene  Community Resource Referral / Chronic Care Management: CRR required this visit?  No  CCM required this visit?  No      Plan:     I have personally reviewed and noted the following in the patient's chart:   Medical and social history Use of alcohol, tobacco or illicit drugs  Current medications and supplements including opioid prescriptions. Patient is not currently taking opioid prescriptions. Functional ability and status Nutritional status Physical activity Advanced directives List of other physicians Hospitalizations, surgeries, and ER visits in previous 12 months Vitals Screenings to include cognitive, depression, and falls Referrals and appointments  In addition, I have reviewed and discussed with patient certain preventive protocols, quality metrics, and best practice recommendations. A written personalized care plan for preventive services as well as general preventive health recommendations were provided to patient.    Mr. Okuda , Thank you for taking time to come for your Medicare Wellness Visit. I appreciate your ongoing commitment to your health goals. Please review the following plan we discussed and let me know if I can assist you in the future.   These are the goals we discussed:  Goals      Stay Active and Independent     Why is this important?   Regular activity or exercise is important to managing back pain.  Activity helps to keep your muscles strong.  You will sleep better and feel more relaxed.  You will have more energy and feel less stressed.  If you are not active now, start slowly. Little changes make a big difference.  Rest, but not too much.  Stay as active as you can and listen to your body's signals.             This is a list  of the screening recommended for you and due dates:  Health Maintenance  Topic Date Due   DTaP/Tdap/Td vaccine (1 - Tdap) Never done   Zoster (Shingles) Vaccine (1 of 2) Never done   Eye exam for diabetics  12/03/2021   Yearly kidney health urinalysis for diabetes  02/09/2022   Flu Shot  06/22/2022   COVID-19 Vaccine (4 - 2023-24 season) 07/23/2022   Hemoglobin A1C  09/25/2022   Pneumonia Vaccine (2 - PCV) 07/02/2023*   Hepatitis C Screening: USPSTF Recommendation to screen - Ages 18-79 yo.  07/02/2023*   Yearly kidney function blood test for diabetes  07/02/2023   Complete foot exam   07/02/2023   Medicare Annual Wellness Visit  10/30/2023   Colon Cancer Screening  05/16/2030  HPV Vaccine  Aged Out  *Topic was postponed. The date shown is not the original due date.     Wilson Singer, [place]   10/29/2022   Nurse Notes: Approximately 30 minute Non-Face -To-Face Medicare Wellness Visit

## 2022-12-02 DIAGNOSIS — Z794 Long term (current) use of insulin: Secondary | ICD-10-CM | POA: Diagnosis not present

## 2022-12-02 DIAGNOSIS — E785 Hyperlipidemia, unspecified: Secondary | ICD-10-CM | POA: Diagnosis not present

## 2022-12-02 DIAGNOSIS — E1169 Type 2 diabetes mellitus with other specified complication: Secondary | ICD-10-CM | POA: Diagnosis not present

## 2022-12-02 DIAGNOSIS — E1165 Type 2 diabetes mellitus with hyperglycemia: Secondary | ICD-10-CM | POA: Diagnosis not present

## 2023-01-03 ENCOUNTER — Encounter: Payer: Self-pay | Admitting: Family Medicine

## 2023-01-03 ENCOUNTER — Ambulatory Visit (INDEPENDENT_AMBULATORY_CARE_PROVIDER_SITE_OTHER): Payer: Medicare Other | Admitting: Family Medicine

## 2023-01-03 VITALS — BP 120/70 | HR 60 | Ht 70.0 in | Wt 168.0 lb

## 2023-01-03 DIAGNOSIS — E785 Hyperlipidemia, unspecified: Secondary | ICD-10-CM

## 2023-01-03 DIAGNOSIS — E1149 Type 2 diabetes mellitus with other diabetic neurological complication: Secondary | ICD-10-CM

## 2023-01-03 MED ORDER — ATORVASTATIN CALCIUM 80 MG PO TABS: 80.0000 mg | ORAL_TABLET | Freq: Every day | ORAL | 1 refills | Status: DC

## 2023-01-03 NOTE — Progress Notes (Signed)
Date:  01/03/2023   Name:  Maxwell Clark   DOB:  1956/08/08   MRN:  [id number]   Chief Complaint: Hyperlipidemia  Hyperlipidemia This is a chronic problem. The current episode started more than 1 year ago. The problem is controlled. Recent lipid tests were reviewed and are normal. He has no history of chronic renal disease, diabetes, hypothyroidism, liver disease, obesity or nephrotic syndrome. There are no known factors aggravating his hyperlipidemia. Pertinent negatives include no chest pain, focal sensory loss, focal weakness, leg pain, myalgias or shortness of breath. Current antihyperlipidemic treatment includes statins. The current treatment provides moderate improvement of lipids. There are no compliance problems.  Risk factors for coronary artery disease include dyslipidemia and hypertension.    Lab Results  Component Value Date   NA 138 07/01/2022   K 4.3 07/01/2022   CO2 21 07/01/2022   GLUCOSE 254 (H) 07/01/2022   BUN 16 07/01/2022   CREATININE 1.10 07/01/2022   CALCIUM 9.1 07/01/2022   EGFR 74 07/01/2022   GFRNONAA 94 12/24/2019   Lab Results  Component Value Date   CHOL 155 07/01/2022   HDL 91 07/01/2022   LDLCALC 51 07/01/2022   TRIG 64 07/01/2022   CHOLHDL 1.7 10/13/2018   Lab Results  Component Value Date   TSH 6.210 (H) 10/15/2014   Lab Results  Component Value Date   HGBA1C 8.4 07/29/2022   Lab Results  Component Value Date   WBC 6.9 12/24/2019   HGB 16.0 12/24/2019   HCT 45.0 12/24/2019   MCV 91 12/24/2019   PLT 249 12/24/2019   Lab Results  Component Value Date   ALT 27 07/01/2022   AST 26 07/01/2022   ALKPHOS 74 07/01/2022   BILITOT 0.3 07/01/2022   No results found for: "25OHVITD2", "25OHVITD3", "VD25OH"   Review of Systems  Constitutional:  Negative for chills and fever.  HENT:  Negative for drooling, ear discharge, ear pain and sore throat.   Respiratory:  Negative for cough, shortness of breath and wheezing.   Cardiovascular:   Negative for chest pain, palpitations and leg swelling.  Gastrointestinal:  Negative for abdominal pain, blood in stool, constipation, diarrhea and nausea.  Endocrine: Negative for polydipsia and polyuria.  Genitourinary:  Negative for dysuria, frequency, hematuria and urgency.  Musculoskeletal:  Negative for back pain, myalgias and neck pain.  Skin:  Negative for rash.  Allergic/Immunologic: Negative for environmental allergies.  Neurological:  Negative for dizziness, focal weakness and headaches.  Hematological:  Does not bruise/bleed easily.  Psychiatric/Behavioral:  Negative for suicidal ideas. The patient is not nervous/anxious.     Patient Active Problem List   Diagnosis Date Noted   Sinus infection 05/01/2020   Corneal scar, right eye 09/29/2018   Routine general medical examination at a health care facility 03/18/2015   Type II diabetes mellitus with neurological manifestations ([place]) 03/18/2015   Ischemic stroke ([place]) 03/18/2015   Combined fat and carbohydrate induced hyperlipemia 03/18/2015   Cerebral infarction due to embolism of left middle cerebral artery ([place]) 02/26/2015   DM (diabetes mellitus) ([place]) 02/26/2015   HLD (hyperlipidemia) 02/26/2015   Dyslipidemia, goal LDL below 70 10/18/2014   Acute ischemic stroke ([place]) 10/15/2014   Aphasia due to stroke 10/15/2014   Cerebral vascular disease 10/15/2014    Allergies  Allergen Reactions   Penicillins     unknown    Past Surgical History:  Procedure Laterality Date   COLONOSCOPY WITH PROPOFOL N/A 05/16/2020   Procedure: COLONOSCOPY WITH PROPOFOL;  Surgeon: Jonathon Bellows, MD;  Location: [facility] ENDOSCOPY;  Service: Gastroenterology;  Laterality: N/A;   LOOP RECORDER IMPLANT N/A 10/18/2014   Procedure: LOOP RECORDER IMPLANT;  Surgeon: Deboraha Sprang, MD;  Location: [facility] C[facility] LAB;  Service: Cardiovascular;  Laterality: N/A;   TEE WITHOUT CARDIOVERSION N/A 10/18/2014   Procedure: TRANSESOPHAGEAL ECHOCARDIOGRAM (TEE);   Surgeon: Sanda Klein, MD;  Location: [facility] ENDOSCOPY;  Service: Cardiovascular;  Laterality: N/A;  loop recorder to follow     Social History   Tobacco Use   Smoking status: Never   Smokeless tobacco: Former    Types: Chew   Tobacco comments:    quit around 2012  Substance Use Topics   Alcohol use: Yes    Alcohol/week: 1.0 standard drink of alcohol    Types: 1 Cans of beer per week    Comment: Occasional    Drug use: No     Medication list has been reviewed and updated.  Current Meds  Medication Sig   atorvastatin (LIPITOR) 80 MG tablet Take 1 tablet (80 mg total) by mouth daily.   B-D ULTRAFINE III SHORT PEN 31G X 8 MM MISC Inject 1 Syringe into the skin daily.   clopidogrel (PLAVIX) 75 MG tablet Take 1 tablet (75 mg total) by mouth daily.   Continuous Blood Gluc Sensor (FREESTYLE LIBRE 2 SENSOR) MISC Use 1 kit every 14 (fourteen) days for glucose monitoring   Continuous Blood Gluc Sensor (FREESTYLE LIBRE 2 SENSOR) MISC    glimepiride (AMARYL) 4 MG tablet Take 1 mg by mouth daily. O'Connell   Insulin Glargine-Lixisenatide 100-33 UNT-MCG/ML SOPN Inject 22 Units into the skin. Dr Honor Junes   Insulin Pen Needle (B-D ULTRAFINE III SHORT PEN) 31G X 8 MM MISC USE ONCE DAILY   pioglitazone (ACTOS) 15 MG tablet Take 15 mg by mouth daily. O'Connell   SYNJARDY XR 12.03-999 MG TB24 Take 2 tablets by mouth every morning. O'Connell       01/03/2023   10:55 AM 07/01/2022   11:07 AM 10/08/2021    2:24 PM 10/08/2021    2:17 PM  GAD 7 : Generalized Anxiety Score  Nervous, Anxious, on Edge 0 0 0 0  Control/stop worrying 0 0 0 0  Worry too much - different things 0 0 0 0  Trouble relaxing 0 0 0 0  Restless 0 0 0 0  Easily annoyed or irritable 0 0 0 0  Afraid - awful might happen 0 0 0 0  Total GAD 7 Score 0 0 0 0  Anxiety Difficulty Not difficult at all Not difficult at all Not difficult at all Not difficult at all       01/03/2023   10:55 AM 10/29/2022    8:13 AM 07/01/2022    11:06 AM  Depression screen PHQ 2/9  Decreased Interest 0 0 0  Down, Depressed, Hopeless 0 0 0  PHQ - 2 Score 0 0 0  Altered sleeping 0  0  Tired, decreased energy 0  0  Change in appetite 0  0  Feeling bad or failure about yourself  0  0  Trouble concentrating 0  0  Moving slowly or fidgety/restless 0  0  Suicidal thoughts 0  0  PHQ-9 Score 0  0  Difficult doing work/chores Not difficult at all  Not difficult at all    BP Readings from Last 3 Encounters:  01/03/23 120/70  07/16/22 132/71  07/01/22 110/60    Physical Exam Vitals and nursing note reviewed.  HENT:  Head: Normocephalic.     Right Ear: External ear normal.     Left Ear: External ear normal.     Nose: Nose normal.     Mouth/Throat:     Mouth: Mucous membranes are moist.  Eyes:     General: No scleral icterus.       Right eye: No discharge.        Left eye: No discharge.     Conjunctiva/sclera: Conjunctivae normal.     Pupils: Pupils are equal, round, and reactive to light.  Neck:     Thyroid: No thyromegaly.     Vascular: No JVD.     Trachea: No tracheal deviation.  Cardiovascular:     Rate and Rhythm: Normal rate and regular rhythm.     Heart sounds: Normal heart sounds. No murmur heard.    No friction rub. No gallop.  Pulmonary:     Effort: No respiratory distress.     Breath sounds: Normal breath sounds. No wheezing, rhonchi or rales.  Abdominal:     General: Bowel sounds are normal.     Palpations: Abdomen is soft. There is no mass.     Tenderness: There is no abdominal tenderness. There is no guarding or rebound.  Musculoskeletal:        General: No tenderness. Normal range of motion.     Cervical back: Normal range of motion and neck supple.  Lymphadenopathy:     Cervical: No cervical adenopathy.  Skin:    General: Skin is warm.     Findings: No lesion or rash.  Neurological:     Mental Status: He is alert.     Wt Readings from Last 3 Encounters:  01/03/23 168 lb (76.2 kg)   07/16/22 160 lb (72.6 kg)  07/01/22 165 lb (74.8 kg)    BP 120/70   Pulse 60   Ht 5' 10"$  (1.778 m)   Wt 168 lb (76.2 kg)   SpO2 96%   BMI 24.11 kg/m   Assessment and Plan: 1. Dyslipidemia, goal LDL below 70 Chronic.  Controlled.  Stable.  Current control with dietary discretion and atorvastatin 80 mg once a day.  Continue atorvastatin 80 mg once a day will recheck in 6 months. - atorvastatin (LIPITOR) 80 MG tablet; Take 1 tablet (80 mg total) by mouth daily.  Dispense: 90 tablet; Refill: 1  2. Type II diabetes mellitus with neurological manifestations (HCC) Chronic.  Controlled.  Stable.  Followed by endocrinology.  Will check microalbuminuria/creatinine urine ratio. - Microalbumin / creatinine urine ratio     Otilio Miu, MD

## 2023-01-04 LAB — MICROALBUMIN / CREATININE URINE RATIO
Creatinine, Urine: 24.5 mg/dL
Microalb/Creat Ratio: <12 mg/g creat (ref 0–29)
Microalbumin, Urine: <3 ug/mL

## 2023-01-13 ENCOUNTER — Other Ambulatory Visit: Payer: Self-pay | Admitting: Family Medicine

## 2023-01-13 DIAGNOSIS — I679 Cerebrovascular disease, unspecified: Secondary | ICD-10-CM

## 2023-01-13 NOTE — Telephone Encounter (Signed)
Requested medications are due for refill today.  yes  Requested medications are on the active medications list.  yes  Last refill. 07/01/2022 #90 1 rf  Future visit scheduled.   yes  Notes to clinic.  Labs are expired.    Requested Prescriptions  Pending Prescriptions Disp Refills   clopidogrel (PLAVIX) 75 MG tablet [Pharmacy Med Name: CLOPIDOGREL 75 MG TABLET] 90 tablet 1    Sig: TAKE 1 TABLET BY MOUTH EVERY DAY     Hematology: Antiplatelets - clopidogrel Failed - 01/13/2023  2:16 AM      Failed - HCT in normal range and within 180 days    Hematocrit  Date Value Ref Range Status  12/24/2019 45.0 37.5 - 51.0 % Final         Failed - HGB in normal range and within 180 days    Hemoglobin  Date Value Ref Range Status  12/24/2019 16.0 13.0 - 17.7 g/dL Final         Failed - PLT in normal range and within 180 days    Platelets  Date Value Ref Range Status  12/24/2019 249 150 - 450 x10E3/uL Final         Passed - Cr in normal range and within 360 days    Creatinine  Date Value Ref Range Status  10/15/2014 1.12 0.60 - 1.30 mg/dL Final   Creatinine, Ser  Date Value Ref Range Status  07/01/2022 1.10 0.76 - 1.27 mg/dL Final         Passed - Valid encounter within last 6 months    Recent Outpatient Visits           1 week ago Dyslipidemia, goal LDL below 70   [place] [facility] [place], Deanna C, MD   6 months ago Peyronie's disease   [facility] [place], Deanna C, MD   1 year ago Dyslipidemia, goal LDL below [place] [facility] [address] [place], Deanna C, MD   1 year ago Cellulitis of chest wall   [facility] [place], [place], MD   1 year ago Cellulitis of chest wall   [facility] [place], [place], MD       Future Appointments              In 5 months Juline Patch, MD [place] at [facility], [facility]

## 2023-01-26 ENCOUNTER — Encounter: Payer: Self-pay | Admitting: Family Medicine

## 2023-01-26 ENCOUNTER — Ambulatory Visit (INDEPENDENT_AMBULATORY_CARE_PROVIDER_SITE_OTHER): Payer: Medicare Other | Admitting: Family Medicine

## 2023-01-26 VITALS — BP 126/76 | HR 60 | Ht 70.0 in | Wt 169.0 lb

## 2023-01-26 DIAGNOSIS — H6121 Impacted cerumen, right ear: Secondary | ICD-10-CM

## 2023-01-26 DIAGNOSIS — H60551 Acute reactive otitis externa, right ear: Secondary | ICD-10-CM | POA: Diagnosis not present

## 2023-01-26 MED ORDER — CIPROFLOXACIN-DEXAMETHASONE 0.3-0.1 % OT SUSP: 4.0000 [drp] | Freq: Two times a day (BID) | OTIC | 0 refills | Status: DC

## 2023-01-26 NOTE — Progress Notes (Signed)
Date:  01/26/2023   Name:  Maxwell Clark   DOB:  01-18-1956   MRN:  [id number]   Chief Complaint: Cerumen Impaction (R) ear)  Patient is a 67 year old male who presents for a decreased hearing exam. The patient reports the following problems: cerumen impaction right. Health maintenance has been reviewed up to date.      Lab Results  Component Value Date   NA 138 07/01/2022   K 4.3 07/01/2022   CO2 21 07/01/2022   GLUCOSE 254 (H) 07/01/2022   BUN 16 07/01/2022   CREATININE 1.10 07/01/2022   CALCIUM 9.1 07/01/2022   EGFR 74 07/01/2022   GFRNONAA 94 12/24/2019   Lab Results  Component Value Date   CHOL 155 07/01/2022   HDL 91 07/01/2022   LDLCALC 51 07/01/2022   TRIG 64 07/01/2022   CHOLHDL 1.7 10/13/2018   Lab Results  Component Value Date   TSH 6.210 (H) 10/15/2014   Lab Results  Component Value Date   HGBA1C 8.4 07/29/2022   Lab Results  Component Value Date   WBC 6.9 12/24/2019   HGB 16.0 12/24/2019   HCT 45.0 12/24/2019   MCV 91 12/24/2019   PLT 249 12/24/2019   Lab Results  Component Value Date   ALT 27 07/01/2022   AST 26 07/01/2022   ALKPHOS 74 07/01/2022   BILITOT 0.3 07/01/2022   No results found for: "25OHVITD2", "25OHVITD3", "VD25OH"   Review of Systems  Constitutional:  Negative for fatigue, fever and unexpected weight change.  HENT:  Negative for congestion, ear discharge, ear pain and tinnitus.   Cardiovascular:  Negative for chest pain, palpitations and leg swelling.    Patient Active Problem List   Diagnosis Date Noted   Sinus infection 05/01/2020   Corneal scar, right eye 09/29/2018   Routine general medical examination at a health care facility 03/18/2015   Type II diabetes mellitus with neurological manifestations ([place]) 03/18/2015   Ischemic stroke ([place]) 03/18/2015   Combined fat and carbohydrate induced hyperlipemia 03/18/2015   Cerebral infarction due to embolism of left middle cerebral artery ([place]) 02/26/2015   DM  (diabetes mellitus) ([place]) 02/26/2015   HLD (hyperlipidemia) 02/26/2015   Dyslipidemia, goal LDL below 70 10/18/2014   Acute ischemic stroke ([place]) 10/15/2014   Aphasia due to stroke 10/15/2014   Cerebral vascular disease 10/15/2014    Allergies  Allergen Reactions   Penicillins     unknown    Past Surgical History:  Procedure Laterality Date   COLONOSCOPY WITH PROPOFOL N/A 05/16/2020   Procedure: COLONOSCOPY WITH PROPOFOL;  Surgeon: Jonathon Bellows, MD;  Location: [facility] ENDOSCOPY;  Service: Gastroenterology;  Laterality: N/A;   LOOP RECORDER IMPLANT N/A 10/18/2014   Procedure: LOOP RECORDER IMPLANT;  Surgeon: Deboraha Sprang, MD;  Location: [facility] CATH LAB;  Service: Cardiovascular;  Laterality: N/A;   TEE WITHOUT CARDIOVERSION N/A 10/18/2014   Procedure: TRANSESOPHAGEAL ECHOCARDIOGRAM (TEE);  Surgeon: Sanda Klein, MD;  Location: [facility] ENDOSCOPY;  Service: Cardiovascular;  Laterality: N/A;  loop recorder to follow     Social History   Tobacco Use   Smoking status: Never   Smokeless tobacco: Former    Types: Chew   Tobacco comments:    quit around 2012  Substance Use Topics   Alcohol use: Yes    Alcohol/week: 1.0 standard drink of alcohol    Types: 1 Cans of beer per week    Comment: Occasional    Drug use: No  Medication list has been reviewed and updated.  Current Meds  Medication Sig   atorvastatin (LIPITOR) 80 MG tablet Take 1 tablet (80 mg total) by mouth daily.   B-D ULTRAFINE III SHORT PEN 31G X 8 MM MISC Inject 1 Syringe into the skin daily.   clopidogrel (PLAVIX) 75 MG tablet TAKE 1 TABLET BY MOUTH EVERY DAY   Continuous Blood Gluc Sensor (FREESTYLE LIBRE 2 SENSOR) MISC Use 1 kit every 14 (fourteen) days for glucose monitoring   Continuous Blood Gluc Sensor (FREESTYLE LIBRE 2 SENSOR) MISC    glimepiride (AMARYL) 4 MG tablet Take 1 mg by mouth daily. O'Connell   Insulin Glargine-Lixisenatide 100-33 UNT-MCG/ML SOPN Inject 22 Units into the skin. Dr Honor Junes   Insulin  Pen Needle (B-D ULTRAFINE III SHORT PEN) 31G X 8 MM MISC USE ONCE DAILY   pioglitazone (ACTOS) 15 MG tablet Take 15 mg by mouth daily. O'Connell   SYNJARDY XR 12.03-999 MG TB24 Take 2 tablets by mouth every morning. O'Connell       01/26/2023    8:01 AM 01/03/2023   10:55 AM 07/01/2022   11:07 AM 10/08/2021    2:24 PM  GAD 7 : Generalized Anxiety Score  Nervous, Anxious, on Edge 0 0 0 0  Control/stop worrying 0 0 0 0  Worry too much - different things 0 0 0 0  Trouble relaxing 0 0 0 0  Restless 0 0 0 0  Easily annoyed or irritable 0 0 0 0  Afraid - awful might happen 0 0 0 0  Total GAD 7 Score 0 0 0 0  Anxiety Difficulty Not difficult at all Not difficult at all Not difficult at all Not difficult at all       01/26/2023    8:01 AM 01/03/2023   10:55 AM 10/29/2022    8:13 AM  Depression screen PHQ 2/9  Decreased Interest 0 0 0  Down, Depressed, Hopeless 0 0 0  PHQ - 2 Score 0 0 0  Altered sleeping 0 0   Tired, decreased energy 0 0   Change in appetite 0 0   Feeling bad or failure about yourself  0 0   Trouble concentrating 0 0   Moving slowly or fidgety/restless 0 0   Suicidal thoughts 0 0   PHQ-9 Score 0 0   Difficult doing work/chores Not difficult at all Not difficult at all     BP Readings from Last 3 Encounters:  01/26/23 126/76  01/03/23 120/70  07/16/22 132/71    Physical Exam Vitals and nursing note reviewed.  HENT:     Head: Normocephalic.     Right Ear: External ear normal. There is impacted cerumen.     Left Ear: Tympanic membrane and external ear normal.     Nose: Nose normal.  Eyes:     Conjunctiva/sclera: Conjunctivae normal.  Neck:     Thyroid: No thyromegaly.     Vascular: No JVD.     Trachea: No tracheal deviation.  Cardiovascular:     Rate and Rhythm: Normal rate and regular rhythm.     Heart sounds: Normal heart sounds. No murmur heard.    No friction rub. No gallop.  Pulmonary:     Effort: No respiratory distress.     Breath sounds:  Normal breath sounds. No wheezing, rhonchi or rales.  Musculoskeletal:        General: No tenderness. Normal range of motion.     Cervical back: Normal range of motion and  neck supple.  Lymphadenopathy:     Cervical: No cervical adenopathy.  Skin:    General: Skin is warm.     Findings: No rash.  Neurological:     Mental Status: He is alert.     Wt Readings from Last 3 Encounters:  01/26/23 169 lb (76.7 kg)  01/03/23 168 lb (76.2 kg)  07/16/22 160 lb (72.6 kg)    BP 126/76   Pulse 60   Ht '5\' 10"'$  (1.778 m)   Wt 169 lb (76.7 kg)   SpO2 98%   BMI 24.25 kg/m   Assessment and Plan:  1. Impacted cerumen of right ear Chronic.  Persistent.  Noted that the time that he was fitted for hearing aids.  Area was gently irrigated and a large cerumen impaction was removed from the right auditory canal..  This left little excoriated area on the floor of the external canal.  2. Acute reactive otitis externa of right ear We will proactively treat with Ciprodex otic 6 suspension for 2 to 3 days to lessen the chance of infection.  Patient has been instructed not to use Q-tips in the future. - ciprofloxacin-dexamethasone (CIPRODEX) OTIC suspension; Place 4 drops into the right ear 2 (two) times daily.  Dispense: 7.5 mL; Refill: 0    Otilio Miu, MD

## 2023-03-24 DIAGNOSIS — E119 Type 2 diabetes mellitus without complications: Secondary | ICD-10-CM | POA: Diagnosis not present

## 2023-03-24 DIAGNOSIS — H179 Unspecified corneal scar and opacity: Secondary | ICD-10-CM | POA: Diagnosis not present

## 2023-03-24 DIAGNOSIS — H2513 Age-related nuclear cataract, bilateral: Secondary | ICD-10-CM | POA: Diagnosis not present

## 2023-03-24 LAB — HM DIABETES EYE EXAM

## 2023-04-14 DIAGNOSIS — Z794 Long term (current) use of insulin: Secondary | ICD-10-CM | POA: Diagnosis not present

## 2023-04-14 DIAGNOSIS — E785 Hyperlipidemia, unspecified: Secondary | ICD-10-CM | POA: Diagnosis not present

## 2023-04-14 DIAGNOSIS — E1165 Type 2 diabetes mellitus with hyperglycemia: Secondary | ICD-10-CM | POA: Diagnosis not present

## 2023-04-14 DIAGNOSIS — E1169 Type 2 diabetes mellitus with other specified complication: Secondary | ICD-10-CM | POA: Diagnosis not present

## 2023-04-14 LAB — HM DIABETES FOOT EXAM: HM Diabetic Foot Exam: NORMAL

## 2023-07-05 ENCOUNTER — Ambulatory Visit: Payer: Medicare Other | Admitting: Family Medicine

## 2023-07-08 ENCOUNTER — Ambulatory Visit (INDEPENDENT_AMBULATORY_CARE_PROVIDER_SITE_OTHER): Payer: Medicare Other | Admitting: Family Medicine

## 2023-07-08 ENCOUNTER — Encounter: Payer: Self-pay | Admitting: Family Medicine

## 2023-07-08 VITALS — BP 124/72 | HR 52 | Ht 70.0 in | Wt 159.0 lb

## 2023-07-08 DIAGNOSIS — I679 Cerebrovascular disease, unspecified: Secondary | ICD-10-CM | POA: Diagnosis not present

## 2023-07-08 DIAGNOSIS — E785 Hyperlipidemia, unspecified: Secondary | ICD-10-CM

## 2023-07-08 MED ORDER — ATORVASTATIN CALCIUM 80 MG PO TABS: 80.0000 mg | ORAL_TABLET | Freq: Every day | ORAL | 1 refills | Status: DC

## 2023-07-08 MED ORDER — CLOPIDOGREL BISULFATE 75 MG PO TABS: 75.0000 mg | ORAL_TABLET | Freq: Every day | ORAL | 1 refills | Status: DC

## 2023-07-08 NOTE — Progress Notes (Signed)
Date:  07/08/2023   Name:  Maxwell Clark   DOB:  06-Apr-1956   MRN:  [id number]   Chief Complaint: Hyperlipidemia and Cerebrovascular Accident (Takes plavix for this)  Hyperlipidemia The current episode started more than 1 year ago. The problem is controlled. Recent lipid tests were reviewed and are normal. He has no history of chronic renal disease, diabetes, hypothyroidism, liver disease, obesity or nephrotic syndrome. There are no known factors aggravating his hyperlipidemia. Pertinent negatives include no chest pain, focal sensory loss, focal weakness, leg pain, myalgias or shortness of breath. Current antihyperlipidemic treatment includes statins. The current treatment provides moderate improvement of lipids. There are no compliance problems.  Risk factors for coronary artery disease include diabetes mellitus and dyslipidemia.  Cerebrovascular Accident This is a chronic problem. The current episode started more than 1 year ago. The problem has been gradually improving. Pertinent negatives include no abdominal pain, chest pain, diaphoresis, fatigue, fever, myalgias or weakness. The treatment provided moderate relief.  Neurologic Problem The patient's pertinent negatives include no altered mental status, clumsiness, focal sensory loss, focal weakness, memory loss, near-syncope, slurred speech, syncope or weakness. Primary symptoms comment: for cerebral vascular disease. Pertinent negatives include no abdominal pain, chest pain, diaphoresis, fatigue, fever, palpitations or shortness of breath. The treatment provided moderate relief. There is no history of liver disease.    Lab Results  Component Value Date   NA 138 07/01/2022   K 4.3 07/01/2022   CO2 21 07/01/2022   GLUCOSE 254 (H) 07/01/2022   BUN 16 07/01/2022   CREATININE 1.10 07/01/2022   CALCIUM 9.1 07/01/2022   EGFR 74 07/01/2022   GFRNONAA 94 12/24/2019   Lab Results  Component Value Date   CHOL 155 07/01/2022   HDL 91  07/01/2022   LDLCALC 51 07/01/2022   TRIG 64 07/01/2022   CHOLHDL 1.7 10/13/2018   Lab Results  Component Value Date   TSH 6.210 (H) 10/15/2014   Lab Results  Component Value Date   HGBA1C 8.4 07/29/2022   Lab Results  Component Value Date   WBC 6.9 12/24/2019   HGB 16.0 12/24/2019   HCT 45.0 12/24/2019   MCV 91 12/24/2019   PLT 249 12/24/2019   Lab Results  Component Value Date   ALT 27 07/01/2022   AST 26 07/01/2022   ALKPHOS 74 07/01/2022   BILITOT 0.3 07/01/2022   No results found for: "25OHVITD2", "25OHVITD3", "VD25OH"   Review of Systems  Constitutional:  Negative for diaphoresis, fatigue and fever.  Respiratory:  Negative for choking, shortness of breath, wheezing and stridor.   Cardiovascular:  Negative for chest pain, palpitations and near-syncope.  Gastrointestinal:  Negative for abdominal pain.  Endocrine: Negative for polydipsia and polyuria.  Genitourinary:  Negative for genital sores and urgency.  Musculoskeletal:  Negative for myalgias.  Neurological:  Negative for focal weakness, syncope and weakness.  Psychiatric/Behavioral:  Negative for memory loss.     Patient Active Problem List   Diagnosis Date Noted   Sinus infection 05/01/2020   Corneal scar, right eye 09/29/2018   Routine general medical examination at a health care facility 03/18/2015   Type II diabetes mellitus with neurological manifestations (HCC) 03/18/2015   Ischemic stroke (HCC) 03/18/2015   Combined fat and carbohydrate induced hyperlipemia 03/18/2015   Cerebral infarction due to embolism of left middle cerebral artery (HCC) 02/26/2015   DM (diabetes mellitus) (HCC) 02/26/2015   HLD (hyperlipidemia) 02/26/2015   Dyslipidemia, goal LDL below 70 10/18/2014  Acute ischemic stroke (HCC) 10/15/2014   Aphasia due to stroke 10/15/2014   Cerebral vascular disease 10/15/2014    Allergies  Allergen Reactions   Penicillins     unknown    Past Surgical History:  Procedure  Laterality Date   COLONOSCOPY WITH PROPOFOL N/A 05/16/2020   Procedure: COLONOSCOPY WITH PROPOFOL;  Surgeon: Wyline Mood, MD;  Location: [facility] ENDOSCOPY;  Service: Gastroenterology;  Laterality: N/A;   LOOP RECORDER IMPLANT N/A 10/18/2014   Procedure: LOOP RECORDER IMPLANT;  Surgeon: Duke Salvia, MD;  Location: [facility] CATH LAB;  Service: Cardiovascular;  Laterality: N/A;   TEE WITHOUT CARDIOVERSION N/A 10/18/2014   Procedure: TRANSESOPHAGEAL ECHOCARDIOGRAM (TEE);  Surgeon: Thurmon Fair, MD;  Location: [facility] ENDOSCOPY;  Service: Cardiovascular;  Laterality: N/A;  loop recorder to follow     Social History   Tobacco Use   Smoking status: Never   Smokeless tobacco: Former    Types: Chew   Tobacco comments:    quit around 2012  Substance Use Topics   Alcohol use: Yes    Alcohol/week: 1.0 standard drink of alcohol    Types: 1 Cans of beer per week    Comment: Occasional    Drug use: No     Medication list has been reviewed and updated.  Current Meds  Medication Sig   atorvastatin (LIPITOR) 80 MG tablet Take 1 tablet (80 mg total) by mouth daily.   B-D ULTRAFINE III SHORT PEN 31G X 8 MM MISC Inject 1 Syringe into the skin daily.   clopidogrel (PLAVIX) 75 MG tablet TAKE 1 TABLET BY MOUTH EVERY DAY   Continuous Blood Gluc Sensor (FREESTYLE LIBRE 2 SENSOR) MISC Use 1 kit every 14 (fourteen) days for glucose monitoring   Continuous Blood Gluc Sensor (FREESTYLE LIBRE 2 SENSOR) MISC    glimepiride (AMARYL) 4 MG tablet Take 1 mg by mouth daily. O'Connell   Insulin Glargine-Lixisenatide 100-33 UNT-MCG/ML SOPN Inject 22 Units into the skin. Dr Gershon Crane   Insulin Pen Needle (B-D ULTRAFINE III SHORT PEN) 31G X 8 MM MISC USE ONCE DAILY   pioglitazone (ACTOS) 15 MG tablet Take 15 mg by mouth daily. O'Connell   SYNJARDY XR 12.03-999 MG TB24 Take 2 tablets by mouth every morning. O'Connell       07/08/2023    4:00 PM 01/26/2023    8:01 AM 01/03/2023   10:55 AM 07/01/2022   11:07 AM  GAD 7 :  Generalized Anxiety Score  Nervous, Anxious, on Edge 0 0 0 0  Control/stop worrying 0 0 0 0  Worry too much - different things 0 0 0 0  Trouble relaxing 0 0 0 0  Restless 0 0 0 0  Easily annoyed or irritable 0 0 0 0  Afraid - awful might happen 0 0 0 0  Total GAD 7 Score 0 0 0 0  Anxiety Difficulty Not difficult at all Not difficult at all Not difficult at all Not difficult at all       07/08/2023    4:00 PM 01/26/2023    8:01 AM 01/03/2023   10:55 AM  Depression screen PHQ 2/9  Decreased Interest 0 0 0  Down, Depressed, Hopeless 0 0 0  PHQ - 2 Score 0 0 0  Altered sleeping 0 0 0  Tired, decreased energy 0 0 0  Change in appetite 0 0 0  Feeling bad or failure about yourself  0 0 0  Trouble concentrating 0 0 0  Moving slowly or fidgety/restless 0 0  0  Suicidal thoughts 0 0 0  PHQ-9 Score 0 0 0  Difficult doing work/chores Not difficult at all Not difficult at all Not difficult at all    BP Readings from Last 3 Encounters:  07/08/23 124/72  01/26/23 126/76  01/03/23 120/70    Physical Exam Vitals and nursing note reviewed.  HENT:     Head: Normocephalic.     Right Ear: Tympanic membrane, ear canal and external ear normal.     Left Ear: Tympanic membrane, ear canal and external ear normal.     Nose: Nose normal. No congestion or rhinorrhea.     Mouth/Throat:     Mouth: Mucous membranes are moist.  Eyes:     General: No scleral icterus.       Right eye: No discharge.        Left eye: No discharge.     Conjunctiva/sclera: Conjunctivae normal.     Pupils: Pupils are equal, round, and reactive to light.  Neck:     Thyroid: No thyromegaly.     Vascular: No JVD.     Trachea: No tracheal deviation.  Cardiovascular:     Rate and Rhythm: Normal rate and regular rhythm.     Heart sounds: Normal heart sounds. No murmur heard.    No friction rub. No gallop.  Pulmonary:     Effort: No respiratory distress.     Breath sounds: Normal breath sounds. No wheezing or rales.   Abdominal:     General: Bowel sounds are normal.     Palpations: Abdomen is soft. There is no mass.     Tenderness: There is no abdominal tenderness. There is no guarding or rebound.  Musculoskeletal:        General: No tenderness. Normal range of motion.     Cervical back: Normal range of motion and neck supple.  Lymphadenopathy:     Cervical: No cervical adenopathy.  Skin:    General: Skin is warm.     Findings: No rash.  Neurological:     Mental Status: He is alert and oriented to person, place, and time.     Cranial Nerves: No cranial nerve deficit.     Deep Tendon Reflexes: Reflexes are normal and symmetric.     Wt Readings from Last 3 Encounters:  07/08/23 159 lb (72.1 kg)  01/26/23 169 lb (76.7 kg)  01/03/23 168 lb (76.2 kg)    BP 124/72   Pulse (!) 52   Ht 5\' 10"  (1.778 m)   Wt 159 lb (72.1 kg)   SpO2 99%   BMI 22.81 kg/m   Assessment and Plan: 1. Cerebral vascular disease Chronic.  Persistent.  But stable.  No progression and no new onset symptomatology.  Will continue Plavix 75 mg once a day. - clopidogrel (PLAVIX) 75 MG tablet; Take 1 tablet (75 mg total) by mouth daily.  Dispense: 90 tablet; Refill: 1  2. Dyslipidemia, goal LDL below 70 Chronic.  Controlled.  Stable.  Asymptomatic.  Continue atorvastatin 80 mg once a day.  Will check lipid panel given the patient did have lunch but it consisted of a ham sandwich. - atorvastatin (LIPITOR) 80 MG tablet; Take 1 tablet (80 mg total) by mouth daily.  Dispense: 90 tablet; Refill: 1 - Lipid Panel With LDL/HDL Ratio     Elizabeth Sauer, MD

## 2023-07-09 LAB — LIPID PANEL WITH LDL/HDL RATIO
Cholesterol, Total: 153 mg/dL (ref 100–199)
HDL: 92 mg/dL (ref >39)
LDL Chol Calc (NIH): 41 mg/dL (ref 0–99)
LDL/HDL Ratio: 0.4 ratio (ref 0.0–3.6)
Triglycerides: 118 mg/dL (ref 0–149)
VLDL Cholesterol Cal: 20 mg/dL (ref 5–40)

## 2023-07-10 ENCOUNTER — Encounter: Payer: Self-pay | Admitting: Family Medicine

## 2023-08-23 DIAGNOSIS — E1169 Type 2 diabetes mellitus with other specified complication: Secondary | ICD-10-CM | POA: Diagnosis not present

## 2023-08-23 DIAGNOSIS — E1165 Type 2 diabetes mellitus with hyperglycemia: Secondary | ICD-10-CM | POA: Diagnosis not present

## 2023-08-23 DIAGNOSIS — Z794 Long term (current) use of insulin: Secondary | ICD-10-CM | POA: Diagnosis not present

## 2023-08-23 DIAGNOSIS — E785 Hyperlipidemia, unspecified: Secondary | ICD-10-CM | POA: Diagnosis not present

## 2023-08-23 LAB — HEMOGLOBIN A1C: Hemoglobin A1C: 7.5

## 2023-09-22 ENCOUNTER — Ambulatory Visit (INDEPENDENT_AMBULATORY_CARE_PROVIDER_SITE_OTHER): Payer: Medicare Other | Admitting: Family Medicine

## 2023-09-22 ENCOUNTER — Encounter: Payer: Self-pay | Admitting: Family Medicine

## 2023-09-22 VITALS — BP 112/60 | HR 69 | Ht 70.0 in | Wt 163.0 lb

## 2023-09-22 DIAGNOSIS — Z8673 Personal history of transient ischemic attack (TIA), and cerebral infarction without residual deficits: Secondary | ICD-10-CM | POA: Diagnosis not present

## 2023-09-22 DIAGNOSIS — I69320 Aphasia following cerebral infarction: Secondary | ICD-10-CM

## 2023-09-22 DIAGNOSIS — I679 Cerebrovascular disease, unspecified: Secondary | ICD-10-CM

## 2023-09-22 NOTE — Progress Notes (Signed)
Date:  09/22/2023   Name:  Maxwell Clark   DOB:  02/22/1956   MRN:  [id number]   Chief Complaint: No chief complaint on file.  Neurologic Problem The patient's primary symptoms include an altered mental status. The patient's pertinent negatives include no clumsiness, focal sensory loss, focal weakness, loss of balance, memory loss, near-syncope, slurred speech, syncope, visual change or weakness. Primary symptoms comment: expressive aphasia. This is a new problem. The current episode started in the past 7 days. The problem has been gradually improving since onset. There was no focality noted. Pertinent negatives include no abdominal pain, auditory change, back pain, bladder incontinence, bowel incontinence, dizziness, fatigue, fever, headaches, light-headedness, palpitations, shortness of breath or vertigo. The treatment provided moderate relief. His past medical history is significant for a CVA.    Lab Results  Component Value Date   NA 138 07/01/2022   K 4.3 07/01/2022   CO2 21 07/01/2022   GLUCOSE 254 (H) 07/01/2022   BUN 16 07/01/2022   CREATININE 1.10 07/01/2022   CALCIUM 9.1 07/01/2022   EGFR 74 07/01/2022   GFRNONAA 94 12/24/2019   Lab Results  Component Value Date   CHOL 153 07/08/2023   HDL 92 07/08/2023   LDLCALC 41 07/08/2023   TRIG 118 07/08/2023   CHOLHDL 1.7 10/13/2018   Lab Results  Component Value Date   TSH 6.210 (H) 10/15/2014   Lab Results  Component Value Date   HGBA1C 8.4 07/29/2022   Lab Results  Component Value Date   WBC 6.9 12/24/2019   HGB 16.0 12/24/2019   HCT 45.0 12/24/2019   MCV 91 12/24/2019   PLT 249 12/24/2019   Lab Results  Component Value Date   ALT 27 07/01/2022   AST 26 07/01/2022   ALKPHOS 74 07/01/2022   BILITOT 0.3 07/01/2022   No results found for: "25OHVITD2", "25OHVITD3", "VD25OH"   Review of Systems  Constitutional:  Negative for fatigue and fever.  Respiratory:  Negative for cough, shortness of breath and  wheezing.   Cardiovascular:  Negative for palpitations and near-syncope.  Gastrointestinal:  Negative for abdominal pain and bowel incontinence.  Genitourinary:  Negative for bladder incontinence.  Musculoskeletal:  Negative for back pain.  Neurological:  Negative for dizziness, vertigo, tremors, focal weakness, seizures, syncope, facial asymmetry, speech difficulty, weakness, light-headedness, numbness, headaches and loss of balance.  Psychiatric/Behavioral:  Negative for memory loss.     Patient Active Problem List   Diagnosis Date Noted   Sinus infection 05/01/2020   Corneal scar, right eye 09/29/2018   Routine general medical examination at a health care facility 03/18/2015   Type II diabetes mellitus with neurological manifestations (HCC) 03/18/2015   Ischemic stroke (HCC) 03/18/2015   Combined fat and carbohydrate induced hyperlipemia 03/18/2015   Cerebral infarction due to embolism of left middle cerebral artery (HCC) 02/26/2015   DM (diabetes mellitus) (HCC) 02/26/2015   HLD (hyperlipidemia) 02/26/2015   Dyslipidemia, goal LDL below 70 10/18/2014   Acute ischemic stroke (HCC) 10/15/2014   Aphasia due to stroke 10/15/2014   Cerebral vascular disease 10/15/2014    Allergies  Allergen Reactions   Penicillins     unknown    Past Surgical History:  Procedure Laterality Date   COLONOSCOPY WITH PROPOFOL N/A 05/16/2020   Procedure: COLONOSCOPY WITH PROPOFOL;  Surgeon: Wyline Mood, MD;  Location: [facility] ENDOSCOPY;  Service: Gastroenterology;  Laterality: N/A;   LOOP RECORDER IMPLANT N/A 10/18/2014   Procedure: LOOP RECORDER IMPLANT;  Surgeon: Salvatore Decent  Graciela Husbands, MD;  Location: [facility] CATH LAB;  Service: Cardiovascular;  Laterality: N/A;   TEE WITHOUT CARDIOVERSION N/A 10/18/2014   Procedure: TRANSESOPHAGEAL ECHOCARDIOGRAM (TEE);  Surgeon: Thurmon Fair, MD;  Location: [facility] ENDOSCOPY;  Service: Cardiovascular;  Laterality: N/A;  loop recorder to follow     Social History   Tobacco Use    Smoking status: Never   Smokeless tobacco: Former    Types: Chew   Tobacco comments:    quit around 2012  Substance Use Topics   Alcohol use: Yes    Alcohol/week: 1.0 standard drink of alcohol    Types: 1 Cans of beer per week    Comment: Occasional    Drug use: No     Medication list has been reviewed and updated.  Current Meds  Medication Sig   atorvastatin (LIPITOR) 80 MG tablet Take 1 tablet (80 mg total) by mouth daily.   B-D ULTRAFINE III SHORT PEN 31G X 8 MM MISC Inject 1 Syringe into the skin daily.   clopidogrel (PLAVIX) 75 MG tablet Take 1 tablet (75 mg total) by mouth daily.   Continuous Blood Gluc Sensor (FREESTYLE LIBRE 2 SENSOR) MISC Use 1 kit every 14 (fourteen) days for glucose monitoring   Continuous Blood Gluc Sensor (FREESTYLE LIBRE 2 SENSOR) MISC    glimepiride (AMARYL) 4 MG tablet Take 1 mg by mouth daily. O'Connell   Insulin Glargine-Lixisenatide 100-33 UNT-MCG/ML SOPN Inject 22 Units into the skin. Dr Gershon Crane   Insulin Pen Needle (B-D ULTRAFINE III SHORT PEN) 31G X 8 MM MISC USE ONCE DAILY   pioglitazone (ACTOS) 15 MG tablet Take 15 mg by mouth daily. O'Connell   SYNJARDY XR 12.03-999 MG TB24 Take 2 tablets by mouth every morning. O'Connell       09/22/2023    2:55 PM 07/08/2023    4:00 PM 01/26/2023    8:01 AM 01/03/2023   10:55 AM  GAD 7 : Generalized Anxiety Score  Nervous, Anxious, on Edge 0 0 0 0  Control/stop worrying 0 0 0 0  Worry too much - different things 0 0 0 0  Trouble relaxing 0 0 0 0  Restless 0 0 0 0  Easily annoyed or irritable 0 0 0 0  Afraid - awful might happen 0 0 0 0  Total GAD 7 Score 0 0 0 0  Anxiety Difficulty Not difficult at all Not difficult at all Not difficult at all Not difficult at all       09/22/2023    2:55 PM 07/08/2023    4:00 PM 01/26/2023    8:01 AM  Depression screen PHQ 2/9  Decreased Interest 0 0 0  Down, Depressed, Hopeless 0 0 0  PHQ - 2 Score 0 0 0  Altered sleeping 0 0 0  Tired, decreased  energy 0 0 0  Change in appetite 0 0 0  Feeling bad or failure about yourself  0 0 0  Trouble concentrating 0 0 0  Moving slowly or fidgety/restless 0 0 0  Suicidal thoughts 0 0 0  PHQ-9 Score 0 0 0  Difficult doing work/chores Not difficult at all Not difficult at all Not difficult at all    BP Readings from Last 3 Encounters:  09/22/23 112/60  07/08/23 124/72  01/26/23 126/76    Physical Exam Vitals and nursing note reviewed.  HENT:     Head: Normocephalic.     Right Ear: Tympanic membrane and external ear normal.     Left Ear: Tympanic  membrane and external ear normal.     Nose: Nose normal.     Mouth/Throat:     Mouth: Mucous membranes are moist.  Eyes:     General: No scleral icterus.       Right eye: No discharge.        Left eye: No discharge.     Conjunctiva/sclera: Conjunctivae normal.     Pupils: Pupils are equal, round, and reactive to light.  Neck:     Thyroid: No thyromegaly.     Vascular: No JVD.     Trachea: No tracheal deviation.  Cardiovascular:     Rate and Rhythm: Normal rate and regular rhythm.     Heart sounds: Normal heart sounds. No murmur heard.    No friction rub. No gallop.  Pulmonary:     Effort: No respiratory distress.     Breath sounds: Normal breath sounds. No wheezing or rales.  Abdominal:     General: Bowel sounds are normal.     Palpations: Abdomen is soft. There is no mass.     Tenderness: There is no abdominal tenderness. There is no guarding or rebound.  Musculoskeletal:        General: No tenderness. Normal range of motion.     Cervical back: Normal range of motion and neck supple.  Lymphadenopathy:     Cervical: No cervical adenopathy.  Skin:    General: Skin is warm.     Findings: No rash.  Neurological:     Mental Status: He is alert and oriented to person, place, and time.     Cranial Nerves: Cranial nerves 2-12 are intact. No cranial nerve deficit.     Sensory: Sensation is intact.     Motor: Motor function is  intact.     Deep Tendon Reflexes:     Reflex Scores:      Bicep reflexes are 1+ on the right side and 1+ on the left side.      Patellar reflexes are 1+ on the right side and 1+ on the left side.    Wt Readings from Last 3 Encounters:  09/22/23 163 lb (73.9 kg)  07/08/23 159 lb (72.1 kg)  01/26/23 169 lb (76.7 kg)    BP 112/60   Pulse 69   Ht 5\' 10"  (1.778 m)   Wt 163 lb (73.9 kg)   SpO2 98%   BMI 23.39 kg/m   Assessment and Plan: 1. History of recurrent TIAs New onset.  Patient has been having episodes of neurologic concerns 1 of aphasia that lasted several minutes and then a second episode a couple weeks ago that he began pushing all the buttons in the car and then did not remember this activity as well patient has had a history of cryptogenic stroke in the past and has been followed by cardiology patient has been instructed to go to the ER if this should recur otherwise we will place a referral to neurology to begin evaluation. - Ambulatory referral to Neurology  2. Cerebral vascular disease Chronic.  Controlled.  Stable.  History of cerebrovascular disease.  Currently controlling blood pressure with diet controlled diabetes is controlled with insulin Amaryl, Synjardy, and pioglitazone..  Hyperlipidemia is controlled with atorvastatin 80 mg once a day.  So all parameters to control stroke activity are in play including diabetic control, hyperlipidemia control, and blood pressure control but patient is having episodes suggested of episodic TIAs for which we will refer to neurology for evaluation. - Ambulatory referral to  Neurology  3. Aphasia as late effect of stroke Patient has ongoing aphasia from a late effect of a cryptogenic stroke that he had several years ago and this is been unchanged. - Ambulatory referral to Neurology     Elizabeth Sauer, MD

## 2023-10-07 DIAGNOSIS — D225 Melanocytic nevi of trunk: Secondary | ICD-10-CM | POA: Diagnosis not present

## 2023-10-07 DIAGNOSIS — D2262 Melanocytic nevi of left upper limb, including shoulder: Secondary | ICD-10-CM | POA: Diagnosis not present

## 2023-10-07 DIAGNOSIS — L570 Actinic keratosis: Secondary | ICD-10-CM | POA: Diagnosis not present

## 2023-10-07 DIAGNOSIS — D2271 Melanocytic nevi of right lower limb, including hip: Secondary | ICD-10-CM | POA: Diagnosis not present

## 2023-10-07 DIAGNOSIS — D2261 Melanocytic nevi of right upper limb, including shoulder: Secondary | ICD-10-CM | POA: Diagnosis not present

## 2023-10-07 DIAGNOSIS — D2272 Melanocytic nevi of left lower limb, including hip: Secondary | ICD-10-CM | POA: Diagnosis not present

## 2023-10-07 DIAGNOSIS — L821 Other seborrheic keratosis: Secondary | ICD-10-CM | POA: Diagnosis not present

## 2023-11-02 ENCOUNTER — Ambulatory Visit: Payer: Medicare Other

## 2023-11-02 DIAGNOSIS — Z0000 Encounter for general adult medical examination without abnormal findings: Secondary | ICD-10-CM | POA: Diagnosis not present

## 2023-11-02 NOTE — Progress Notes (Signed)
Subjective:   Maxwell Clark is a 67 y.o. male who presents for Medicare Annual/Subsequent preventive examination.  Visit Complete: Virtual I connected with  Maxwell Clark on 11/02/23 by a audio enabled telemedicine application and verified that I am speaking with the correct person using two identifiers.  Patient Location: Home  Provider Location: Office/Clinic  I discussed the limitations of evaluation and management by telemedicine. The patient expressed understanding and agreed to proceed.  Vital Signs: Because this visit was a virtual/telehealth visit, some criteria may be missing or patient reported. Any vitals not documented were not able to be obtained and vitals that have been documented are patient reported.       Objective:    There were no vitals filed for this visit. There is no height or weight on file to calculate BMI.     11/02/2023    2:37 PM 10/29/2022    8:24 AM 05/16/2020   10:24 AM 10/23/2014    9:48 AM 10/22/2014    9:39 AM 10/16/2014   10:03 AM  Advanced Directives  Does Patient Have a Medical Advance Directive? No No No No No No  Would patient like information on creating a medical advance directive? No - Patient declined No - Patient declined  No - patient declined information Yes - Educational materials given No - patient declined information    Current Medications (verified) Outpatient Encounter Medications as of 11/02/2023  Medication Sig   atorvastatin (LIPITOR) 80 MG tablet Take 1 tablet (80 mg total) by mouth daily.   B-D ULTRAFINE III SHORT PEN 31G X 8 MM MISC Inject 1 Syringe into the skin daily.   clopidogrel (PLAVIX) 75 MG tablet Take 1 tablet (75 mg total) by mouth daily.   Continuous Blood Gluc Sensor (FREESTYLE LIBRE 2 SENSOR) MISC Use 1 kit every 14 (fourteen) days for glucose monitoring   Continuous Blood Gluc Sensor (FREESTYLE LIBRE 2 SENSOR) MISC    glimepiride (AMARYL) 4 MG tablet Take 1 mg by mouth daily. O'Connell   Insulin  Glargine-Lixisenatide 100-33 UNT-MCG/ML SOPN Inject 22 Units into the skin. Dr Gershon Crane   Insulin Pen Needle (B-D ULTRAFINE III SHORT PEN) 31G X 8 MM MISC USE ONCE DAILY   pioglitazone (ACTOS) 15 MG tablet Take 15 mg by mouth daily. O'Connell   SYNJARDY XR 12.03-999 MG TB24 Take 2 tablets by mouth every morning. O'Connell   No facility-administered encounter medications on file as of 11/02/2023.    Allergies (verified) Penicillins   History: Past Medical History:  Diagnosis Date   Diabetes mellitus without complication (HCC)    Hyperlipidemia    Stroke (HCC) 10/15/2014   Past Surgical History:  Procedure Laterality Date   COLONOSCOPY WITH PROPOFOL N/A 05/16/2020   Procedure: COLONOSCOPY WITH PROPOFOL;  Surgeon: Wyline Mood, MD;  Location: [facility] ENDOSCOPY;  Service: Gastroenterology;  Laterality: N/A;   LOOP RECORDER IMPLANT N/A 10/18/2014   Procedure: LOOP RECORDER IMPLANT;  Surgeon: Duke Salvia, MD;  Location: [facility] CATH LAB;  Service: Cardiovascular;  Laterality: N/A;   TEE WITHOUT CARDIOVERSION N/A 10/18/2014   Procedure: TRANSESOPHAGEAL ECHOCARDIOGRAM (TEE);  Surgeon: Thurmon Fair, MD;  Location: [facility] ENDOSCOPY;  Service: Cardiovascular;  Laterality: N/A;  loop recorder to follow    Family History  Problem Relation Age of Onset   Atrial fibrillation Mother    Hypertension Mother    Lung cancer Father    Kidney cancer Father    Social History   Socioeconomic History   Marital status: Married  Spouse name: Bethann Berkshire   Number of children: 2   Years of education: HS   Highest education level: Not on file  Occupational History    Employer: OTHER    Comment: Self-employed landscaper  Tobacco Use   Smoking status: Never   Smokeless tobacco: Former    Types: Chew   Tobacco comments:    quit around 2012  Substance and Sexual Activity   Alcohol use: Yes    Alcohol/week: 1.0 standard drink of alcohol    Types: 1 Cans of beer per week    Comment: Occasional    Drug use:  No   Sexual activity: Yes  Other Topics Concern   Not on file  Social History Narrative   Patient lives at home with spouse.   Caffeine Use: 10 cups daily   Social Determinants of Health   Financial Resource Strain: Low Risk  (11/02/2023)   Overall Financial Resource Strain (CARDIA)    Difficulty of Paying Living Expenses: Not hard at all  Food Insecurity: No Food Insecurity (11/02/2023)   Hunger Vital Sign    Worried About Running Out of Food in the Last Year: Never true    Ran Out of Food in the Last Year: Never true  Transportation Needs: No Transportation Needs (11/02/2023)   PRAPARE - Administrator, Civil Service (Medical): No    Lack of Transportation (Non-Medical): No  Physical Activity: Sufficiently Active (11/02/2023)   Exercise Vital Sign    Days of Exercise per Week: 7 days    Minutes of Exercise per Session: 60 min  Stress: No Stress Concern Present (11/02/2023)   [employer] of Occupational Health - Occupational Stress Questionnaire    Feeling of Stress : Not at all  Social Connections: Socially Integrated (11/02/2023)   Social Connection and Isolation Panel [NHANES]    Frequency of Communication with Friends and Family: More than three times a week    Frequency of Social Gatherings with Friends and Family: More than three times a week    Attends Religious Services: More than 4 times per year    Active Member of [employer] or Organizations: Yes    Attends Engineer, structural: More than 4 times per year    Marital Status: Married    Tobacco Counseling Counseling given: Not Answered Tobacco comments: quit around 2012   Clinical Intake:  Pre-visit preparation completed: Yes  Pain : No/denies pain     BMI - recorded: 23.4 Nutritional Risks: None Diabetes: Yes CBG done?: No Did pt. bring in CBG monitor from home?: No  How often do you need to have someone help you when you read instructions, pamphlets, or other written materials  from your doctor or pharmacy?: 1 - Never  Interpreter Needed?: No  Information entered by :: Kennedy Bucker, LPN   Activities of Daily Living    11/02/2023    2:38 PM  In your present state of health, do you have any difficulty performing the following activities:  Hearing? 1  Vision? 0  Difficulty concentrating or making decisions? 0  Walking or climbing stairs? 0  Dressing or bathing? 0  Doing errands, shopping? 0  Preparing Food and eating ? N  Using the Toilet? N  In the past six months, have you accidently leaked urine? N  Do you have problems with loss of bowel control? N  Managing your Medications? Y  Managing your Finances? N  Housekeeping or managing your Housekeeping? N    Patient Care  Team: Duanne Limerick, MD as PCP - General (Family Medicine) Galen Manila, MD as Referring Physician (Ophthalmology)  Indicate any recent Medical Services you may have received from other than [facility] providers in the past year (date may be approximate).     Assessment:   This is a routine wellness examination for Maxwell Clark.  Hearing/Vision screen Hearing Screening - Comments:: WEARS AIDS AT [facility]  Vision Screening - Comments:: READERS- DR.PORFILIO   Goals Addressed             This Visit's Progress    DIET - EAT MORE FRUITS AND VEGETABLES         Depression Screen    11/02/2023    2:35 PM 09/22/2023    2:55 PM 07/08/2023    4:00 PM 01/26/2023    8:01 AM 01/03/2023   10:55 AM 10/29/2022    8:13 AM 07/01/2022   11:06 AM  PHQ 2/9 Scores  PHQ - 2 Score 0 0 0 0 0 0 0  PHQ- 9 Score 0 0 0 0 0  0    Fall Risk    11/02/2023    2:38 PM 09/22/2023    2:55 PM 07/08/2023    4:00 PM 01/26/2023    8:01 AM 01/03/2023   10:55 AM  Fall Risk   Falls in the past year? 0 0 0 0 0  Number falls in past yr: 0 0 0 0 0  Injury with Fall? 0 0 0 0 0  Risk for fall due to : No Fall Risks No Fall Risks No Fall Risks No Fall Risks No Fall Risks  Follow up Falls prevention discussed;Falls  evaluation completed Falls evaluation completed Falls evaluation completed Falls evaluation completed Falls evaluation completed    MEDICARE RISK AT HOME: Medicare Risk at Home Any stairs in or around the home?: Yes If so, are there any without handrails?: No Home free of loose throw rugs in walkways, pet beds, electrical cords, etc?: Yes Adequate lighting in your home to reduce risk of falls?: Yes Life alert?: No Use of a cane, walker or w/c?: No Grab bars in the bathroom?: Yes Shower chair or bench in shower?: Yes Elevated toilet seat or a handicapped toilet?: Yes  TIMED UP AND GO:  Was the test performed?  No    Cognitive Function:        11/02/2023    2:40 PM 10/29/2022    8:14 AM  6CIT Screen  What Year? 0 points 0 points  What month? 0 points 0 points  What time? 0 points 0 points  Count back from 20 0 points 0 points  Months in reverse 4 points 0 points  Repeat phrase 0 points 0 points  Total Score 4 points 0 points    Immunizations Immunization History  Administered Date(s) Administered   Influenza,inj,Quad PF,6+ Mos 11/02/2017, 12/24/2019, 09/25/2020   Influenza-Unspecified 09/29/2018   PFIZER(Purple Top)SARS-COV-2 Vaccination 02/15/2020, 03/07/2020, 11/25/2020   Pneumococcal Polysaccharide-23 10/18/2014    TDAP status: Due, Education has been provided regarding the importance of this vaccine. Advised may receive this vaccine at local pharmacy or Health Dept. Aware to provide a copy of the vaccination record if obtained from local pharmacy or Health Dept. Verbalized acceptance and understanding.  Flu Vaccine status: Declined, Education has been provided regarding the importance of this vaccine but patient still declined. Advised may receive this vaccine at local pharmacy or Health Dept. Aware to provide a copy of the vaccination record if obtained from local pharmacy  or Health Dept. Verbalized acceptance and understanding.  Pneumococcal vaccine status:  Declined,  Education has been provided regarding the importance of this vaccine but patient still declined. Advised may receive this vaccine at local pharmacy or Health Dept. Aware to provide a copy of the vaccination record if obtained from local pharmacy or Health Dept. Verbalized acceptance and understanding.   Covid-19 vaccine status: Completed vaccines  Qualifies for Shingles Vaccine? Yes   Zostavax completed No   Shingrix Completed?: No.    Education has been provided regarding the importance of this vaccine. Patient has been advised to call insurance company to determine out of pocket expense if they have not yet received this vaccine. Advised may also receive vaccine at local pharmacy or Health Dept. Verbalized acceptance and understanding.  Screening Tests Health Maintenance  Topic Date Due   Zoster Vaccines- Shingrix (1 of 2) Never done   Pneumonia Vaccine 73+ Years old (2 of 2 - PCV) 10/19/2015   HEMOGLOBIN A1C  01/27/2023   Diabetic kidney evaluation - eGFR measurement  07/02/2023   COVID-19 Vaccine (4 - 2023-24 season) 07/24/2023   INFLUENZA VACCINE  02/20/2024 (Originally 06/23/2023)   Hepatitis C Screening  07/07/2024 (Originally 09/06/1974)   Diabetic kidney evaluation - Urine ACR  01/04/2024   OPHTHALMOLOGY EXAM  03/23/2024   FOOT EXAM  04/13/2024   Medicare Annual Wellness (AWV)  11/01/2024   Colonoscopy  05/16/2030   HPV VACCINES  Aged Out   DTaP/Tdap/Td  Discontinued    Health Maintenance  Health Maintenance Due  Topic Date Due   Zoster Vaccines- Shingrix (1 of 2) Never done   Pneumonia Vaccine 80+ Years old (2 of 2 - PCV) 10/19/2015   HEMOGLOBIN A1C  01/27/2023   Diabetic kidney evaluation - eGFR measurement  07/02/2023   COVID-19 Vaccine (4 - 2023-24 season) 07/24/2023    Colorectal cancer screening: Type of screening: Colonoscopy. Completed 05/16/20. Repeat every 10 years  Lung Cancer Screening: (Low Dose CT Chest recommended if Age 29-80 years, 20  pack-year currently smoking OR have quit w/in 15years.) does not qualify.     Additional Screening:  Hepatitis C Screening: does qualify; Completed NO  Vision Screening: Recommended annual ophthalmology exams for early detection of glaucoma and other disorders of the eye. Is the patient up to date with their annual eye exam?  Yes  Who is the provider or what is the name of the office in which the patient attends annual eye exams? DR.PORFILIO If pt is not established with a provider, would they like to be referred to a provider to establish care? No .   Dental Screening: Recommended annual dental exams for proper oral hygiene  Diabetic Foot Exam: Diabetic Foot Exam: Completed 04/14/23  Community Resource Referral / Chronic Care Management: CRR required this visit?  No   CCM required this visit?  No     Plan:     I have personally reviewed and noted the following in the patient's chart:   Medical and social history Use of alcohol, tobacco or illicit drugs  Current medications and supplements including opioid prescriptions. Patient is not currently taking opioid prescriptions. Functional ability and status Nutritional status Physical activity Advanced directives List of other physicians Hospitalizations, surgeries, and ER visits in previous 12 months Vitals Screenings to include cognitive, depression, and falls Referrals and appointments  In addition, I have reviewed and discussed with patient certain preventive protocols, quality metrics, and best practice recommendations. A written personalized care plan for preventive services as  well as general preventive health recommendations were provided to patient.     Hal Hope, LPN   [id number]   After Visit Summary: (MyChart) Due to this being a telephonic visit, the after visit summary with patients personalized plan was offered to patient via MyChart   Nurse Notes: NONE

## 2023-11-02 NOTE — Patient Instructions (Addendum)
Maxwell Clark , Thank you for taking time to come for your Medicare Wellness Visit. I appreciate your ongoing commitment to your health goals. Please review the following plan we discussed and let me know if I can assist you in the future.   Referrals/Orders/Follow-Ups/Clinician Recommendations: NONE  This is a list of the screening recommended for you and due dates:  Health Maintenance  Topic Date Due   Zoster (Shingles) Vaccine (1 of 2) Never done   Pneumonia Vaccine (2 of 2 - PCV) 10/19/2015   Hemoglobin A1C  01/27/2023   Yearly kidney function blood test for diabetes  07/02/2023   COVID-19 Vaccine (4 - 2023-24 season) 07/24/2023   Flu Shot  02/20/2024*   Hepatitis C Screening  07/07/2024*   Yearly kidney health urinalysis for diabetes  01/04/2024   Eye exam for diabetics  03/23/2024   Complete foot exam   04/13/2024   Medicare Annual Wellness Visit  11/01/2024   Colon Cancer Screening  05/16/2030   HPV Vaccine  Aged Out   DTaP/Tdap/Td vaccine  Discontinued  *Topic was postponed. The date shown is not the original due date.    Advanced directives: (ACP Link)Information on Advanced Care Planning can be found at [facility] Advance Health Care Directives Advance Health Care Directives ([email or web address]   Next Medicare Annual Wellness Visit scheduled for next year: Yes   11/07/24 @ 2:00 PM BY VIDEO

## 2023-11-11 ENCOUNTER — Other Ambulatory Visit: Payer: Self-pay | Admitting: Family Medicine

## 2023-11-11 DIAGNOSIS — E785 Hyperlipidemia, unspecified: Secondary | ICD-10-CM

## 2023-12-04 ENCOUNTER — Other Ambulatory Visit: Payer: Self-pay | Admitting: Family Medicine

## 2023-12-04 DIAGNOSIS — I679 Cerebrovascular disease, unspecified: Secondary | ICD-10-CM

## 2023-12-27 ENCOUNTER — Encounter: Payer: Self-pay | Admitting: Family Medicine

## 2023-12-27 ENCOUNTER — Ambulatory Visit: Payer: Medicare Other | Admitting: Family Medicine

## 2023-12-27 VITALS — BP 114/62 | HR 76 | Ht 70.0 in | Wt 167.0 lb

## 2023-12-27 DIAGNOSIS — I679 Cerebrovascular disease, unspecified: Secondary | ICD-10-CM

## 2023-12-27 DIAGNOSIS — E785 Hyperlipidemia, unspecified: Secondary | ICD-10-CM | POA: Diagnosis not present

## 2023-12-27 MED ORDER — CLOPIDOGREL BISULFATE 75 MG PO TABS: 75.0000 mg | ORAL_TABLET | Freq: Every day | ORAL | 1 refills | Status: DC

## 2023-12-27 MED ORDER — ATORVASTATIN CALCIUM 80 MG PO TABS: 80.0000 mg | ORAL_TABLET | Freq: Every day | ORAL | 1 refills | Status: DC

## 2023-12-27 NOTE — Progress Notes (Signed)
 Date:  12/27/2023   Name:  Maxwell Clark   DOB:  [birth year]   MRN:  [id number]   Chief Complaint: Hyperlipidemia and CVD  Hyperlipidemia This is a chronic problem. The current episode started more than 1 year ago. The problem is controlled. Recent lipid tests were reviewed and are normal. He has no history of chronic renal disease, diabetes, hypothyroidism, liver disease, obesity or nephrotic syndrome. There are no known factors aggravating his hyperlipidemia. Pertinent negatives include no chest pain, focal sensory loss, focal weakness, leg pain, myalgias or shortness of breath. Current antihyperlipidemic treatment includes statins. The current treatment provides moderate improvement of lipids. There are no compliance problems.  Risk factors for coronary artery disease include dyslipidemia.    Lab Results  Component Value Date   NA 138 07/01/2022   K 4.3 07/01/2022   CO2 21 07/01/2022   GLUCOSE 254 (H) 07/01/2022   BUN 16 07/01/2022   CREATININE 1.10 07/01/2022   CALCIUM  9.1 07/01/2022   EGFR 74 07/01/2022   GFRNONAA 94 12/24/2019   Lab Results  Component Value Date   CHOL 153 07/08/2023   HDL 92 07/08/2023   LDLCALC 41 07/08/2023   TRIG 118 07/08/2023   CHOLHDL 1.7 10/13/2018   Lab Results  Component Value Date   TSH 6.210 (H) 10/15/2014   Lab Results  Component Value Date   HGBA1C 7.5 08/23/2023   Lab Results  Component Value Date   WBC 6.9 12/24/2019   HGB 16.0 12/24/2019   HCT 45.0 12/24/2019   MCV 91 12/24/2019   PLT 249 12/24/2019   Lab Results  Component Value Date   ALT 27 07/01/2022   AST 26 07/01/2022   ALKPHOS 74 07/01/2022   BILITOT 0.3 07/01/2022   No results found for: 25OHVITD2, 25OHVITD3, VD25OH   Review of Systems  Constitutional:  Negative for chills and fever.  HENT:  Negative for drooling, ear discharge, ear pain and sore throat.   Respiratory:  Negative for cough, shortness of breath and wheezing.   Cardiovascular:   Negative for chest pain, palpitations and leg swelling.  Gastrointestinal:  Negative for abdominal pain, blood in stool, constipation, diarrhea and nausea.  Endocrine: Negative for polydipsia.  Genitourinary:  Negative for dysuria, frequency, hematuria and urgency.  Musculoskeletal:  Negative for back pain, myalgias and neck pain.  Skin:  Negative for rash.  Allergic/Immunologic: Negative for environmental allergies.  Neurological:  Positive for facial asymmetry. Negative for dizziness, focal weakness, weakness, light-headedness, numbness and headaches.       Chronic facial assymetry  Hematological:  Does not bruise/bleed easily.  Psychiatric/Behavioral:  Negative for suicidal ideas. The patient is not nervous/anxious.     Patient Active Problem List   Diagnosis Date Noted   Sinus infection 05/01/2020   Corneal scar, right eye 09/29/2018   Routine general medical examination at a health care facility 03/18/2015   Type II diabetes mellitus with neurological manifestations (HCC) 03/18/2015   Ischemic stroke (HCC) 03/18/2015   Combined fat and carbohydrate induced hyperlipemia 03/18/2015   Cerebral infarction due to embolism of left middle cerebral artery (HCC) 02/26/2015   DM (diabetes mellitus) (HCC) 02/26/2015   HLD (hyperlipidemia) 02/26/2015   Dyslipidemia, goal LDL below 70 10/18/2014   Acute ischemic stroke (HCC) 10/15/2014   Aphasia due to stroke 10/15/2014   Cerebral vascular disease 10/15/2014    Allergies  Allergen Reactions   Penicillins     unknown    Past Surgical History:  Procedure Laterality  Date   COLONOSCOPY WITH PROPOFOL  N/A 05/16/2020   Procedure: COLONOSCOPY WITH PROPOFOL ;  Surgeon: Therisa Bi, MD;  Location: [facility] ENDOSCOPY;  Service: Gastroenterology;  Laterality: N/A;   LOOP RECORDER IMPLANT N/A 10/18/2014   Procedure: LOOP RECORDER IMPLANT;  Surgeon: Elspeth JAYSON Sage, MD;  Location: [facility] CATH LAB;  Service: Cardiovascular;  Laterality: N/A;   TEE WITHOUT  CARDIOVERSION N/A 10/18/2014   Procedure: TRANSESOPHAGEAL ECHOCARDIOGRAM (TEE);  Surgeon: Jerel Balding, MD;  Location: [facility] ENDOSCOPY;  Service: Cardiovascular;  Laterality: N/A;  loop recorder to follow     Social History   Tobacco Use   Smoking status: Never   Smokeless tobacco: Former    Types: Chew   Tobacco comments:    quit around 2012  Substance Use Topics   Alcohol use: Yes    Alcohol/week: 1.0 standard drink of alcohol    Types: 1 Cans of beer per week    Comment: Occasional    Drug use: No     Medication list has been reviewed and updated.  Current Meds  Medication Sig   atorvastatin  (LIPITOR ) 80 MG tablet TAKE 1 TABLET BY MOUTH EVERY DAY   B-D ULTRAFINE III SHORT PEN 31G X 8 MM MISC Inject 1 Syringe into the skin daily.   clopidogrel  (PLAVIX ) 75 MG tablet TAKE 1 TABLET BY MOUTH EVERY DAY   Continuous Blood Gluc Sensor (FREESTYLE LIBRE 2 SENSOR) MISC Use 1 kit every 14 (fourteen) days for glucose monitoring   Continuous Blood Gluc Sensor (FREESTYLE LIBRE 2 SENSOR) MISC    glimepiride (AMARYL) 4 MG tablet Take 1 mg by mouth daily. O'Connell   Insulin  Glargine-Lixisenatide 100-33 UNT-MCG/ML SOPN Inject 22 Units into the skin. Dr Cherilyn   Insulin  Pen Needle (B-D ULTRAFINE III SHORT PEN) 31G X 8 MM MISC USE ONCE DAILY   pioglitazone (ACTOS) 15 MG tablet Take 15 mg by mouth daily. O'Connell   SYNJARDY XR 12.03-999 MG TB24 Take 2 tablets by mouth every morning. O'Connell       12/27/2023    3:47 PM 09/22/2023    2:55 PM 07/08/2023    4:00 PM 01/26/2023    8:01 AM  GAD 7 : Generalized Anxiety Score  Nervous, Anxious, on Edge 0 0 0 0  Control/stop worrying 0 0 0 0  Worry too much - different things 0 0 0 0  Trouble relaxing 0 0 0 0  Restless 0 0 0 0  Easily annoyed or irritable 0 0 0 0  Afraid - awful might happen 0 0 0 0  Total GAD 7 Score 0 0 0 0  Anxiety Difficulty Not difficult at all Not difficult at all Not difficult at all Not difficult at all        12/27/2023    3:46 PM 11/02/2023    2:35 PM 09/22/2023    2:55 PM  Depression screen PHQ 2/9  Decreased Interest 0 0 0  Down, Depressed, Hopeless 0 0 0  PHQ - 2 Score 0 0 0  Altered sleeping 0 0 0  Tired, decreased energy 0 0 0  Change in appetite 0 0 0  Feeling bad or failure about yourself  0 0 0  Trouble concentrating 0 0 0  Moving slowly or fidgety/restless 0 0 0  Suicidal thoughts 0 0 0  PHQ-9 Score 0 0 0  Difficult doing work/chores Not difficult at all Not difficult at all Not difficult at all    BP Readings from Last 3 Encounters:  12/27/23 114/62  09/22/23 112/60  07/08/23 124/72    Physical Exam Vitals and nursing note reviewed.  Constitutional:      Appearance: He is well-developed.  HENT:     Head: Normocephalic and atraumatic.     Right Ear: Tympanic membrane, ear canal and external ear normal.     Left Ear: Tympanic membrane, ear canal and external ear normal.     Nose: Nose normal.     Mouth/Throat:     Mouth: Mucous membranes are moist.     Dentition: Normal dentition.  Eyes:     General: Lids are normal. No visual field deficit or scleral icterus.    Conjunctiva/sclera: Conjunctivae normal.     Pupils: Pupils are equal, round, and reactive to light.  Neck:     Thyroid : No thyromegaly.     Vascular: No carotid bruit, hepatojugular reflux or JVD.     Trachea: No tracheal deviation.  Cardiovascular:     Rate and Rhythm: Normal rate and regular rhythm.     Chest Wall: PMI is not displaced.     Pulses:          Radial pulses are 2+ on the right side and 2+ on the left side.     Heart sounds: S1 normal and S2 normal. Murmur heard.     Systolic murmur is present with a grade of 1/6.     No diastolic murmur is present.     No friction rub. No gallop. No S3 or S4 sounds.  Pulmonary:     Effort: Pulmonary effort is normal.     Breath sounds: Normal breath sounds. No wheezing, rhonchi or rales.  Abdominal:     General: Bowel sounds are normal. There is no  distension.     Palpations: Abdomen is soft. There is no hepatomegaly, splenomegaly or mass.     Tenderness: There is no abdominal tenderness.     Hernia: There is no hernia in the left inguinal area.  Musculoskeletal:     Cervical back: Normal range of motion and neck supple.  Lymphadenopathy:     Cervical: No cervical adenopathy.  Skin:    General: Skin is warm and dry.  Neurological:     Mental Status: He is alert.     Cranial Nerves: Facial asymmetry present. No cranial nerve deficit or dysarthria.     Sensory: Sensation is intact.     Motor: Motor function is intact.     Deep Tendon Reflexes: Reflexes are normal and symmetric.     Reflex Scores:      Patellar reflexes are 2+ on the right side and 2+ on the left side. Psychiatric:        Mood and Affect: Mood is not anxious or depressed.     Wt Readings from Last 3 Encounters:  12/27/23 167 lb (75.8 kg)  09/22/23 163 lb (73.9 kg)  07/08/23 159 lb (72.1 kg)    BP 114/62   Pulse 76   Ht 5' 10 (1.778 m)   Wt 167 lb (75.8 kg)   SpO2 98%   BMI 23.96 kg/m   Assessment and Plan:  1. Dyslipidemia, goal LDL below 70 Chronic.  Controlled.  Stable.  Patient is tolerating well without muscle aches or muscle weakness.  Will continue atorvastatin  80 mg once a day.  And will check lipid panel for current level of LDL control and CMP for hepatic concerns.  Will recheck patient in 6 months in the meantime patient's been encouraged to use  low-cholesterol low triglyceride diet dietary guidelines for eating habits. - atorvastatin  (LIPITOR ) 80 MG tablet; Take 1 tablet (80 mg total) by mouth daily.  Dispense: 90 tablet; Refill: 1 - Comprehensive metabolic panel - Lipid Panel With LDL/HDL Ratio  2. Cerebral vascular disease (Primary) Chronic.  Controlled.  Stable.  No progression of his neurologic condition.  There is some residual weakness as noted from his previous CVA.  The patient is doing well and we will continue with Plavix  75 mg  once a day and controlling blood pressure and controlling lipids and maintaining a relative relatively active work schedule.  We will recheck patient in 6 months. - clopidogrel  (PLAVIX ) 75 MG tablet; Take 1 tablet (75 mg total) by mouth daily.  Dispense: 90 tablet; Refill: 1 - Lipid Panel With LDL/HDL Ratio    Cathryne Molt, MD

## 2023-12-27 NOTE — Patient Instructions (Signed)

## 2023-12-28 LAB — COMPREHENSIVE METABOLIC PANEL
ALT: 28 [IU]/L (ref 0–44)
AST: 36 [IU]/L (ref 0–40)
Albumin: 4.5 g/dL (ref 3.9–4.9)
Alkaline Phosphatase: 64 [IU]/L (ref 44–121)
BUN/Creatinine Ratio: 20 (ref 10–24)
BUN: 23 mg/dL (ref 8–27)
Bilirubin Total: 0.2 mg/dL (ref 0.0–1.2)
CO2: 21 mmol/L (ref 20–29)
Calcium: 9.6 mg/dL (ref 8.6–10.2)
Chloride: 102 mmol/L (ref 96–106)
Creatinine, Ser: 1.15 mg/dL (ref 0.76–1.27)
Globulin, Total: 2.8 g/dL (ref 1.5–4.5)
Glucose: 146 mg/dL — ABNORMAL HIGH (ref 70–99)
Potassium: 4.3 mmol/L (ref 3.5–5.2)
Sodium: 140 mmol/L (ref 134–144)
Total Protein: 7.3 g/dL (ref 6.0–8.5)
eGFR: 70 mL/min/{1.73_m2} (ref >59)

## 2023-12-28 LAB — LIPID PANEL WITH LDL/HDL RATIO
Cholesterol, Total: 160 mg/dL (ref 100–199)
HDL: 92 mg/dL (ref >39)
LDL Chol Calc (NIH): 51 mg/dL (ref 0–99)
LDL/HDL Ratio: 0.6 {ratio} (ref 0.0–3.6)
Triglycerides: 97 mg/dL (ref 0–149)
VLDL Cholesterol Cal: 17 mg/dL (ref 5–40)

## 2024-01-04 DIAGNOSIS — Z794 Long term (current) use of insulin: Secondary | ICD-10-CM | POA: Diagnosis not present

## 2024-01-04 DIAGNOSIS — E785 Hyperlipidemia, unspecified: Secondary | ICD-10-CM | POA: Diagnosis not present

## 2024-01-04 DIAGNOSIS — E1169 Type 2 diabetes mellitus with other specified complication: Secondary | ICD-10-CM | POA: Diagnosis not present

## 2024-01-04 DIAGNOSIS — E1165 Type 2 diabetes mellitus with hyperglycemia: Secondary | ICD-10-CM | POA: Diagnosis not present

## 2024-01-04 DIAGNOSIS — Z79899 Other long term (current) drug therapy: Secondary | ICD-10-CM | POA: Diagnosis not present

## 2024-03-29 DIAGNOSIS — E119 Type 2 diabetes mellitus without complications: Secondary | ICD-10-CM | POA: Diagnosis not present

## 2024-03-29 DIAGNOSIS — H179 Unspecified corneal scar and opacity: Secondary | ICD-10-CM | POA: Diagnosis not present

## 2024-03-29 DIAGNOSIS — H43813 Vitreous degeneration, bilateral: Secondary | ICD-10-CM | POA: Diagnosis not present

## 2024-03-29 DIAGNOSIS — H2513 Age-related nuclear cataract, bilateral: Secondary | ICD-10-CM | POA: Diagnosis not present

## 2024-03-29 LAB — HM DIABETES EYE EXAM

## 2024-05-09 DIAGNOSIS — E1169 Type 2 diabetes mellitus with other specified complication: Secondary | ICD-10-CM | POA: Diagnosis not present

## 2024-05-09 DIAGNOSIS — E785 Hyperlipidemia, unspecified: Secondary | ICD-10-CM | POA: Diagnosis not present

## 2024-05-09 DIAGNOSIS — E1165 Type 2 diabetes mellitus with hyperglycemia: Secondary | ICD-10-CM | POA: Diagnosis not present

## 2024-05-09 DIAGNOSIS — Z794 Long term (current) use of insulin: Secondary | ICD-10-CM | POA: Diagnosis not present

## 2024-05-29 ENCOUNTER — Ambulatory Visit: Admitting: Physician Assistant

## 2024-05-29 ENCOUNTER — Encounter: Payer: Self-pay | Admitting: Family Medicine

## 2024-05-29 ENCOUNTER — Telehealth: Payer: Self-pay | Admitting: Family Medicine

## 2024-05-29 NOTE — Telephone Encounter (Signed)
 Copied from CRM [id number]. Topic: Appointments - Scheduling Inquiry for Clinic >> May 29, 2024 11:32 AM Gustabo D wrote: Patient will be a No show today  patient didn't cancel before 24 hrs

## 2024-05-30 ENCOUNTER — Ambulatory Visit: Payer: Medicare Other | Admitting: Physician Assistant

## 2024-06-06 ENCOUNTER — Telehealth: Payer: Self-pay | Admitting: Family Medicine

## 2024-06-06 NOTE — Telephone Encounter (Signed)
 Noted. Spoke with Avelina. No recent stroke. This is in reference to his previous stroke from ~9y ago and the residual effects from it. Avelina describes episodes where the patient seems distant and makes odd vocalizations, typically for 15-30 seconds at a time. As far as I can tell, he's not seen neurology for about 8 years. Discussed with Avelina it sounds like he may be having TIAs or possibly some seizure-like activity. I'll be seeing him in 2 days, will discuss further then. Likely needs to return to neuro.

## 2024-06-06 NOTE — Telephone Encounter (Signed)
 Please review CRM. Patient does have a upcoming appointment on the 18th.

## 2024-06-06 NOTE — Telephone Encounter (Signed)
 Copied from CRM [id number]. Topic: General - Other >> Jun 06, 2024 10:59 AM Wess RAMAN wrote: Reason for CRM: Patient's wife, Avelina, would like to let Manya Sieving, PA-C know that patient has recently had a stroke and is currently making a grunting noise noise to the point where he is choking sometimes. She stated patient will not share these details. Also has impaired speech.  Callback #: [phone]

## 2024-06-08 ENCOUNTER — Encounter: Payer: Self-pay | Admitting: Physician Assistant

## 2024-06-08 ENCOUNTER — Ambulatory Visit (INDEPENDENT_AMBULATORY_CARE_PROVIDER_SITE_OTHER): Admitting: Physician Assistant

## 2024-06-08 VITALS — BP 104/64 | HR 63 | Temp 98.7°F | Ht 70.0 in | Wt 162.0 lb

## 2024-06-08 DIAGNOSIS — I679 Cerebrovascular disease, unspecified: Secondary | ICD-10-CM | POA: Diagnosis not present

## 2024-06-08 DIAGNOSIS — Z7984 Long term (current) use of oral hypoglycemic drugs: Secondary | ICD-10-CM | POA: Diagnosis not present

## 2024-06-08 DIAGNOSIS — Z794 Long term (current) use of insulin: Secondary | ICD-10-CM | POA: Diagnosis not present

## 2024-06-08 DIAGNOSIS — E785 Hyperlipidemia, unspecified: Secondary | ICD-10-CM | POA: Diagnosis not present

## 2024-06-08 DIAGNOSIS — I69320 Aphasia following cerebral infarction: Secondary | ICD-10-CM

## 2024-06-08 DIAGNOSIS — E1149 Type 2 diabetes mellitus with other diabetic neurological complication: Secondary | ICD-10-CM | POA: Diagnosis not present

## 2024-06-08 MED ORDER — ATORVASTATIN CALCIUM 80 MG PO TABS: 80.0000 mg | ORAL_TABLET | Freq: Every day | ORAL | 2 refills | Status: AC

## 2024-06-08 MED ORDER — CLOPIDOGREL BISULFATE 75 MG PO TABS: 75.0000 mg | ORAL_TABLET | Freq: Every day | ORAL | 2 refills | Status: AC

## 2024-06-08 MED ORDER — CLOTRIMAZOLE-BETAMETHASONE 1-0.05 % EX CREA: TOPICAL_CREAM | Freq: Two times a day (BID) | CUTANEOUS | 2 refills | Status: AC

## 2024-06-08 NOTE — Assessment & Plan Note (Signed)
 Discussed with patient that I do think it would be wise to consult with neurology since it has been so long, if for no other reason then to establish with a neurologist as part of his long-term care team.  Dr. Maree would be a great resource for him.  He will consider it.

## 2024-06-08 NOTE — Assessment & Plan Note (Signed)
 Refill atorvastatin .  Last LDL at goal in February 2025.  Will plan for repeat next time.

## 2024-06-08 NOTE — Assessment & Plan Note (Signed)
Refill Plavix 

## 2024-06-08 NOTE — Assessment & Plan Note (Signed)
 Seems to be doing reasonably well, last A1c was 8.0% which is not quite at goal.  Endocrinology manages.  Recent med adjustments seem to be tolerated well.  Continue specialist follow-up

## 2024-06-08 NOTE — Progress Notes (Signed)
 Date:  06/08/2024   Name:  Maxwell Clark   DOB:  05/12/1956   MRN:  [id number]   Chief Complaint: Medical Management of Chronic Issues  HPI Kylin is a very pleasant 68 year old male with a history of DM2 on insulin  (managed by endocrinology), HLD, and aphasia due to history of stroke who presents new to me today as transfer of care from my recently retired Animator Dr. Joshua.  The exact cause of his stroke was never identified despite completing cardiology and neurology workup.  Recent visit with endocrinology 05/09/2024 with A1c 8.0%, and I have reviewed these notes in their entirety.  Patient is on insulin  regimen as well as oral medications for this problem.  His wife Lolita joins for this visit and describes episodes where the patient makes odd vocalizations, typically for 15-30 seconds at a time, usually after eating.   Regarding the aphasia, he underwent speech therapy for 6 months right after his stroke ~9 years ago but is not interested in further therapy for this problem.  He states that as long as he slows down and chooses his words carefully, he has no problem communicating with others, and I would agree that he is quite easy to understand despite the occasional stutter or brief trouble with word finding.  When he feels rushed, he tends to get tongue-tied.  As far as I can tell, he's not seen neurology for about 8 years.  Lolita made an appointment for him to see her neurologist Dr. Maree, but patient insisted that he did not want to go because he sees enough doctors.  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  He would like me to refill Lotrisone  which was previously prescribed for what sounds like angular cheilitis.  He clarifies that he uses it quite rarely, mainly in the winter.  Medication list has been reviewed and updated.  Current Meds  Medication Sig   Continuous Blood Gluc Sensor (FREESTYLE LIBRE 2 SENSOR) MISC Use 1 kit every  14 (fourteen) days for glucose monitoring   Continuous Blood Gluc Sensor (FREESTYLE LIBRE 2 SENSOR) MISC    glimepiride (AMARYL) 4 MG tablet Take 1 mg by mouth daily. O'Connell   Insulin  Glargine (BASAGLAR  KWIKPEN) 100 UNIT/ML SMARTSIG:25 Unit(s) SUB-Q Daily (Patient taking differently: 20 Units daily.)   Insulin  Pen Needle (B-D ULTRAFINE III SHORT PEN) 31G X 8 MM MISC USE ONCE DAILY   pioglitazone (ACTOS) 15 MG tablet Take 15 mg by mouth daily. O'Connell   SYNJARDY XR 12.03-999 MG TB24 Take 2 tablets by mouth every morning. O'Connell   [DISCONTINUED] atorvastatin  (LIPITOR ) 80 MG tablet Take 1 tablet (80 mg total) by mouth daily.   [DISCONTINUED] clopidogrel  (PLAVIX ) 75 MG tablet Take 1 tablet (75 mg total) by mouth daily.   [DISCONTINUED] clotrimazole -betamethasone  (LOTRISONE ) cream Apply topically.   [DISCONTINUED] Insulin  Glargine-Lixisenatide 100-33 UNT-MCG/ML SOPN Inject 22 Units into the skin. Dr Cherilyn     Review of Systems  Patient Active Problem List   Diagnosis Date Noted   Long term current use of oral hypoglycemic drug 06/08/2024   Corneal scar, right eye 09/29/2018   Type 2 diabetes mellitus with neurologic complication, with long-term current use of insulin  (HCC) 03/18/2015   History of embolic stroke 02/26/2015   Dyslipidemia, goal LDL below 70 10/18/2014   Aphasia as late effect of cerebrovascular accident (CVA) 10/15/2014   Cerebral vascular disease 10/15/2014    Allergies  Allergen Reactions   Penicillins  unknown    Immunization History  Administered Date(s) Administered   Influenza,inj,Quad PF,6+ Mos 11/02/2017, 12/24/2019, 09/25/2020   Influenza-Unspecified 09/29/2018   PFIZER(Purple Top)SARS-COV-2 Vaccination 02/15/2020, 03/07/2020, 11/25/2020   Pneumococcal Polysaccharide-23 10/18/2014    Past Surgical History:  Procedure Laterality Date   COLONOSCOPY WITH PROPOFOL  N/A 05/16/2020   Procedure: COLONOSCOPY WITH PROPOFOL ;  Surgeon: Therisa Bi, MD;   Location: [facility] ENDOSCOPY;  Service: Gastroenterology;  Laterality: N/A;   LOOP RECORDER IMPLANT N/A 10/18/2014   Procedure: LOOP RECORDER IMPLANT;  Surgeon: Elspeth JAYSON Sage, MD;  Location: [facility] CATH LAB;  Service: Cardiovascular;  Laterality: N/A;   TEE WITHOUT CARDIOVERSION N/A 10/18/2014   Procedure: TRANSESOPHAGEAL ECHOCARDIOGRAM (TEE);  Surgeon: Jerel Balding, MD;  Location: [facility] ENDOSCOPY;  Service: Cardiovascular;  Laterality: N/A;  loop recorder to follow     Social History   Tobacco Use   Smoking status: Never   Smokeless tobacco: Former    Types: Chew    Quit date: 2012   Tobacco comments:    quit around 2012  Substance Use Topics   Alcohol use: Yes    Alcohol/week: 1.0 standard drink of alcohol    Types: 1 Cans of beer per week    Comment: Occasional    Drug use: No    Family History  Problem Relation Age of Onset   Atrial fibrillation Mother    Hypertension Mother    Lung cancer Father    Kidney cancer Father         06/08/2024    2:51 PM 12/27/2023    3:47 PM 09/22/2023    2:55 PM 07/08/2023    4:00 PM  GAD 7 : Generalized Anxiety Score  Nervous, Anxious, on Edge 0 0 0 0  Control/stop worrying 2 0 0 0  Worry too much - different things 2 0 0 0  Trouble relaxing 2 0 0 0  Restless 2 0 0 0  Easily annoyed or irritable 2 0 0 0  Afraid - awful might happen 0 0 0 0  Total GAD 7 Score 10 0 0 0  Anxiety Difficulty Not difficult at all Not difficult at all Not difficult at all Not difficult at all       06/08/2024    2:51 PM 12/27/2023    3:46 PM 11/02/2023    2:35 PM  Depression screen PHQ 2/9  Decreased Interest 0 0 0  Down, Depressed, Hopeless 0 0 0  PHQ - 2 Score 0 0 0  Altered sleeping  0 0  Tired, decreased energy  0 0  Change in appetite  0 0  Feeling bad or failure about yourself   0 0  Trouble concentrating  0 0  Moving slowly or fidgety/restless  0 0  Suicidal thoughts  0 0  PHQ-9 Score  0 0  Difficult doing work/chores  Not difficult at all Not  difficult at all    BP Readings from Last 3 Encounters:  06/08/24 104/64  12/27/23 114/62  09/22/23 112/60    Wt Readings from Last 3 Encounters:  06/08/24 162 lb (73.5 kg)  12/27/23 167 lb (75.8 kg)  09/22/23 163 lb (73.9 kg)    BP 104/64   Pulse 63   Temp 98.7 F (37.1 C)   Ht 5' 10 (1.778 m)   Wt 162 lb (73.5 kg)   SpO2 97%   BMI 23.24 kg/m   Physical Exam Vitals and nursing note reviewed.  Constitutional:      Appearance: Normal appearance.  Neck:     Vascular: No carotid bruit.  Cardiovascular:     Rate and Rhythm: Normal rate and regular rhythm.     Heart sounds: No murmur heard.    No friction rub. No gallop.  Pulmonary:     Effort: Pulmonary effort is normal.     Breath sounds: Normal breath sounds.  Abdominal:     General: There is no distension.  Musculoskeletal:        General: Normal range of motion.  Skin:    General: Skin is warm and dry.  Neurological:     Mental Status: He is alert and oriented to person, place, and time.     Gait: Gait is intact.  Psychiatric:        Mood and Affect: Mood and affect normal.     Recent Labs     Component Value Date/Time   NA 140 12/27/2023 1612   NA 143 10/15/2014 1541   K 4.3 12/27/2023 1612   K 4.5 10/15/2014 1541   CL 102 12/27/2023 1612   CL 104 10/15/2014 1541   CO2 21 12/27/2023 1612   CO2 28 10/15/2014 1541   GLUCOSE 146 (H) 12/27/2023 1612   GLUCOSE 174 (H) 10/15/2014 2226   GLUCOSE 225 (H) 10/15/2014 1541   BUN 23 12/27/2023 1612   BUN 13 10/15/2014 1541   CREATININE 1.15 12/27/2023 1612   CREATININE 1.12 10/15/2014 1541   CALCIUM  9.6 12/27/2023 1612   CALCIUM  8.6 10/15/2014 1541   PROT 7.3 12/27/2023 1612   PROT 7.9 10/15/2014 1541   ALBUMIN 4.5 12/27/2023 1612   ALBUMIN 4.0 10/15/2014 1541   AST 36 12/27/2023 1612   AST 27 10/15/2014 1541   ALT 28 12/27/2023 1612   ALT 34 10/15/2014 1541   ALKPHOS 64 12/27/2023 1612   ALKPHOS 87 10/15/2014 1541   BILITOT 0.2 12/27/2023 1612    BILITOT 0.5 10/15/2014 1541   GFRNONAA 94 12/24/2019 1450   GFRNONAA >60 10/15/2014 1541   GFRAA 109 12/24/2019 1450   GFRAA >60 10/15/2014 1541    Lab Results  Component Value Date   WBC 6.9 12/24/2019   HGB 16.0 12/24/2019   HCT 45.0 12/24/2019   MCV 91 12/24/2019   PLT 249 12/24/2019   Lab Results  Component Value Date   HGBA1C 7.5 08/23/2023   Lab Results  Component Value Date   CHOL 160 12/27/2023   HDL 92 12/27/2023   LDLCALC 51 12/27/2023   TRIG 97 12/27/2023   CHOLHDL 1.7 10/13/2018   Lab Results  Component Value Date   TSH 6.210 (H) 10/15/2014     Assessment and Plan:  Type 2 diabetes mellitus with other neurologic complication, with long-term current use of insulin  [facility]) Assessment & Plan: Seems to be doing reasonably well, last A1c was 8.0% which is not quite at goal.  Endocrinology manages.  Recent med adjustments seem to be tolerated well.  Continue specialist follow-up   Long term current use of oral hypoglycemic drug  Aphasia as late effect of cerebrovascular accident (CVA) Assessment & Plan: Discussed with patient that I do think it would be wise to consult with neurology since it has been so long, if for no other reason then to establish with a neurologist as part of his long-term care team.  Dr. Maree would be a great resource for him.  He will consider it.   Cerebral vascular disease Assessment & Plan: Refill Plavix   Orders: -     Clopidogrel  Bisulfate;  Take 1 tablet (75 mg total) by mouth daily.  Dispense: 90 tablet; Refill: 2  Dyslipidemia, goal LDL below 70 Assessment & Plan: Refill atorvastatin .  Last LDL at goal in February 2025.  Will plan for repeat next time.  Orders: -     Atorvastatin  Calcium ; Take 1 tablet (80 mg total) by mouth daily.  Dispense: 90 tablet; Refill: 2  Other orders -     Clotrimazole -Betamethasone ; Apply topically 2 (two) times daily.  Dispense: 30 g; Refill: 2     Return in about 6 months (around  12/09/2024) for OV f/u chronic conditions.    Rolan Hoyle, PA-C, DMSc, Nutritionist [facility] [phone]

## 2024-08-08 DIAGNOSIS — S0502XA Injury of conjunctiva and corneal abrasion without foreign body, left eye, initial encounter: Secondary | ICD-10-CM | POA: Diagnosis not present

## 2024-08-08 DIAGNOSIS — H179 Unspecified corneal scar and opacity: Secondary | ICD-10-CM | POA: Diagnosis not present

## 2024-08-08 DIAGNOSIS — S0502XS Injury of conjunctiva and corneal abrasion without foreign body, left eye, sequela: Secondary | ICD-10-CM | POA: Diagnosis not present

## 2024-08-08 DIAGNOSIS — H16229 Keratoconjunctivitis sicca, not specified as Sjogren's, unspecified eye: Secondary | ICD-10-CM | POA: Diagnosis not present

## 2024-08-10 DIAGNOSIS — S0502XA Injury of conjunctiva and corneal abrasion without foreign body, left eye, initial encounter: Secondary | ICD-10-CM | POA: Diagnosis not present

## 2024-08-10 DIAGNOSIS — H16229 Keratoconjunctivitis sicca, not specified as Sjogren's, unspecified eye: Secondary | ICD-10-CM | POA: Diagnosis not present

## 2024-08-10 DIAGNOSIS — H179 Unspecified corneal scar and opacity: Secondary | ICD-10-CM | POA: Diagnosis not present

## 2024-08-10 DIAGNOSIS — S0502XS Injury of conjunctiva and corneal abrasion without foreign body, left eye, sequela: Secondary | ICD-10-CM | POA: Diagnosis not present

## 2024-09-12 DIAGNOSIS — E1169 Type 2 diabetes mellitus with other specified complication: Secondary | ICD-10-CM | POA: Diagnosis not present

## 2024-09-12 DIAGNOSIS — E785 Hyperlipidemia, unspecified: Secondary | ICD-10-CM | POA: Diagnosis not present

## 2024-09-12 DIAGNOSIS — Z794 Long term (current) use of insulin: Secondary | ICD-10-CM | POA: Diagnosis not present

## 2024-09-12 DIAGNOSIS — E1165 Type 2 diabetes mellitus with hyperglycemia: Secondary | ICD-10-CM | POA: Diagnosis not present

## 2024-11-07 ENCOUNTER — Ambulatory Visit: Payer: Self-pay

## 2024-11-07 DIAGNOSIS — Z0000 Encounter for general adult medical examination without abnormal findings: Secondary | ICD-10-CM

## 2024-11-07 NOTE — Patient Instructions (Addendum)
 Mr. Klingensmith,  Thank you for taking the time for your Medicare Wellness Visit. I appreciate your continued commitment to your health goals. Please review the care plan we discussed, and feel free to reach out if I can assist you further.  Please note that Annual Wellness Visits do not include a physical exam. Some assessments may be limited, especially if the visit was conducted virtually. If needed, we may recommend an in-person follow-up with your provider.  Ongoing Care Seeing your primary care provider every 3 to 6 months helps us  monitor your health and provide consistent, personalized care.   Referrals If a referral was made during today's visit and you haven't received any updates within two weeks, please contact the referred provider directly to check on the status.  Recommended Screenings: NEEDS [facility]  Health Maintenance  Topic Date Due   Hepatitis C Screening  Never done   Zoster (Shingles) Vaccine (1 of 2) Never done   Pneumococcal Vaccine for age over 35 (2 of 2 - PCV) 10/19/2015   COVID-19 Vaccine (4 - 2025-26 season) 07/23/2024   Yearly kidney health urinalysis for diabetes  01/03/2025*   Flu Shot  02/19/2025*   Hemoglobin A1C  11/08/2024   Complete foot exam   12/23/2024   Yearly kidney function blood test for diabetes  12/26/2024   Eye exam for diabetics  03/29/2025   Medicare Annual Wellness Visit  11/07/2025   Colon Cancer Screening  05/16/2030   Meningitis B Vaccine  Aged Out   DTaP/Tdap/Td vaccine  Discontinued  *Topic was postponed. The date shown is not the original due date.       11/07/2024    2:03 PM  Advanced Directives  Does Patient Have a Medical Advance Directive? No  Would patient like information on creating a medical advance directive? No - Patient declined    Vision: Annual vision screenings are recommended for early detection of glaucoma, cataracts, and diabetic retinopathy. These exams can also reveal signs of chronic conditions such as  diabetes and high blood pressure.  Dental: Annual dental screenings help detect early signs of oral cancer, gum disease, and other conditions linked to overall health, including heart disease and diabetes.  Please see the attached documents for additional preventive care recommendations.   NEXT AWV 12/05/25 @ 10:00 AM IN PERSON

## 2024-11-07 NOTE — Progress Notes (Signed)
 Chief Complaint  Patient presents with   Medicare Wellness     Subjective:   Maxwell Clark is a 68 y.o. male who presents for a Medicare Annual Wellness Visit.  Visit info / Clinical Intake: Medicare Wellness Visit Type:: Subsequent Annual Wellness Visit Persons participating in visit and providing information:: patient Medicare Wellness Visit Mode:: Telephone If telephone:: video declined Since this visit was completed virtually, some vitals may be partially provided or unavailable. Missing vitals are due to the limitations of the virtual format.: Unable to obtain vitals - no equipment If Telephone or Video please confirm:: I connected with patient using audio/video enable telemedicine. I verified patient identity with two identifiers, discussed telehealth limitations, and patient agreed to proceed. Patient Location:: home Provider Location:: office Interpreter Needed?: No Pre-visit prep was completed: yes AWV questionnaire completed by patient prior to visit?: no Living arrangements:: lives with spouse/significant other; with family/others Patient's Overall Health Status Rating: very good Typical amount of pain: none Does pain affect daily life?: no Are you currently prescribed opioids?: no  Dietary Habits and Nutritional Risks How many meals a day?: 3 Eats fruit and vegetables daily?: yes Most meals are obtained by: preparing own meals In the last 2 weeks, have you had any of the following?: none Diabetic:: (!) yes Any non-healing wounds?: no How often do you check your BS?: continuous glucose monitor Would you like to be referred to a Nutritionist or for Diabetic Management? : no  Functional Status Activities of Daily Living (to include ambulation/medication): Independent Ambulation: Independent Medication Administration: Needs assistance (comment) Is this a change from baseline?: Pre-admission baseline Home Management (perform basic housework or laundry):  Independent Manage your own finances?: (!) no Primary transportation is: driving Concerns about vision?: no *vision screening is required for [facility]* (readers- Dr.Porfilio) Concerns about hearing?: (!) yes Uses hearing aids?: (!) yes (both ears) Hear whispered voice?: yes  Fall Screening Falls in the past year?: 0 Number of falls in past year: 0 Was there an injury with Fall?: 0 Fall Risk Category Calculator: 0 Patient Fall Risk Level: Low Fall Risk  Fall Risk Patient at Risk for Falls Due to: No Fall Risks Fall risk Follow up: Falls evaluation completed; Falls prevention discussed  Home and Transportation Safety: All rugs have non-skid backing?: yes All stairs or steps have railings?: yes Grab bars in the bathtub or shower?: yes Have non-skid surface in bathtub or shower?: yes Good home lighting?: yes Regular seat belt use?: yes Hospital stays in the last year:: no  Cognitive Assessment Difficulty concentrating, remembering, or making decisions? : no Will 6CIT or Mini Cog be Completed: yes What year is it?: 0 points What month is it?: 0 points Give patient an address phrase to remember (5 components): [address] [place], [place] About what time is it?: 0 points Count backwards from 20 to 1: 0 points Say the months of the year in reverse: 2 points Repeat the address phrase from earlier: 4 points 6 CIT Score: 6 points  Advance Directives (For Healthcare) Does Patient Have a Medical Advance Directive?: No Would patient like information on creating a medical advance directive?: No - Patient declined  Reviewed/Updated  Reviewed/Updated: Reviewed All (Medical, Surgical, Family, Medications, Allergies, Care Teams, Patient Goals)    Allergies (verified) Penicillins   Current Medications (verified) Outpatient Encounter Medications as of 11/07/2024  Medication Sig   atorvastatin  (LIPITOR ) 80 MG tablet Take 1 tablet (80 mg total) by mouth daily.   B-D ULTRAFINE III  SHORT  PEN 31G X 8 MM MISC Inject 1 Syringe into the skin daily.   clopidogrel  (PLAVIX ) 75 MG tablet Take 1 tablet (75 mg total) by mouth daily.   clotrimazole -betamethasone  (LOTRISONE ) cream Apply topically 2 (two) times daily.   Continuous Blood Gluc Sensor (FREESTYLE LIBRE 2 SENSOR) MISC Use 1 kit every 14 (fourteen) days for glucose monitoring   Continuous Blood Gluc Sensor (FREESTYLE LIBRE 2 SENSOR) MISC    glimepiride (AMARYL) 4 MG tablet Take 1 mg by mouth daily. O'Connell   Insulin  Glargine (BASAGLAR  KWIKPEN) 100 UNIT/ML SMARTSIG:25 Unit(s) SUB-Q Daily   Insulin  Pen Needle (B-D ULTRAFINE III SHORT PEN) 31G X 8 MM MISC USE ONCE DAILY   pioglitazone (ACTOS) 15 MG tablet Take 15 mg by mouth daily. O'Connell   SYNJARDY XR 12.03-999 MG TB24 Take 2 tablets by mouth every morning. O'Connell   No facility-administered encounter medications on file as of 11/07/2024.    History: Past Medical History:  Diagnosis Date   Diabetes mellitus without complication (HCC)    Hyperlipidemia    Stroke (HCC) 10/15/2014   Past Surgical History:  Procedure Laterality Date   COLONOSCOPY WITH PROPOFOL  N/A 05/16/2020   Procedure: COLONOSCOPY WITH PROPOFOL ;  Surgeon: Therisa Bi, MD;  Location: [facility] ENDOSCOPY;  Service: Gastroenterology;  Laterality: N/A;   LOOP RECORDER IMPLANT N/A 10/18/2014   Procedure: LOOP RECORDER IMPLANT;  Surgeon: Elspeth JAYSON Sage, MD;  Location: [facility] CATH LAB;  Service: Cardiovascular;  Laterality: N/A;   TEE WITHOUT CARDIOVERSION N/A 10/18/2014   Procedure: TRANSESOPHAGEAL ECHOCARDIOGRAM (TEE);  Surgeon: Jerel Balding, MD;  Location: [facility] ENDOSCOPY;  Service: Cardiovascular;  Laterality: N/A;  loop recorder to follow    Family History  Problem Relation Age of Onset   Atrial fibrillation Mother    Hypertension Mother    Lung cancer Father    Kidney cancer Father    Social History   Occupational History    Employer: OTHER    Comment: Self-employed landscaper  Tobacco Use   Smoking  status: Never   Smokeless tobacco: Former    Types: Chew    Quit date: 2012   Tobacco comments:    quit around 2012  Substance and Sexual Activity   Alcohol use: Yes    Alcohol/week: 1.0 standard drink of alcohol    Types: 1 Cans of beer per week    Comment: Occasional    Drug use: No   Sexual activity: Yes   Tobacco Counseling Counseling given: Not Answered Tobacco comments: quit around 2012  SDOH Screenings   Food Insecurity: No Food Insecurity (06/08/2024)  Housing: Unknown (12/09/2023)   Received from [facility]  Transportation Needs: No Transportation Needs (06/08/2024)  Utilities: Not At Risk (06/08/2024)  Alcohol Screen: Low Risk (11/02/2023)  Depression (PHQ2-9): Low Risk (06/08/2024)  Financial Resource Strain: Low Risk (11/02/2023)  Physical Activity: Sufficiently Active (11/02/2023)  Social Connections: Socially Integrated (11/02/2023)  Stress: No Stress Concern Present (11/02/2023)  Tobacco Use: Medium Risk (11/07/2024)  Health Literacy: Adequate Health Literacy (11/02/2023)   See flowsheets for full screening details  Depression Screen PHQ 2 & 9 Depression Scale- Over the past 2 weeks, how often have you been bothered by any of the following problems? Little interest or pleasure in doing things: 0 Feeling down, depressed, or hopeless (PHQ Adolescent also includes...irritable): 0 PHQ-2 Total Score: 0 Trouble falling or staying asleep, or sleeping too much: 0 Feeling tired or having little energy: 0 Poor appetite or overeating (PHQ Adolescent also includes...weight  loss): 0 Feeling bad about yourself - or that you are a failure or have let yourself or your family down: 0 Trouble concentrating on things, such as reading the newspaper or watching television (PHQ Adolescent also includes...like school work): 0 Moving or speaking so slowly that other people could have noticed. Or the opposite - being so fidgety or restless that you have been moving  around a lot more than usual: 0 Thoughts that you would be better off dead, or of hurting yourself in some way: 0 PHQ-9 Total Score: 0 If you checked off any problems, how difficult have these problems made it for you to do your work, take care of things at home, or get along with other people?: Not difficult at all     Goals Addressed             This Visit's Progress    DIET - INCREASE WATER INTAKE               Objective:    There were no vitals filed for this visit. There is no height or weight on file to calculate BMI.  Hearing/Vision screen No results found. Immunizations and Health Maintenance Health Maintenance  Topic Date Due   Hepatitis C Screening  Never done   Zoster Vaccines- Shingrix (1 of 2) Never done   Pneumococcal Vaccine: 50+ Years (2 of 2 - PCV) 10/19/2015   COVID-19 Vaccine (4 - 2025-26 season) 07/23/2024   Diabetic kidney evaluation - Urine ACR  01/03/2025 (Originally 01/04/2024)   Influenza Vaccine  02/19/2025 (Originally 06/22/2024)   HEMOGLOBIN A1C  11/08/2024   FOOT EXAM  12/23/2024   Diabetic kidney evaluation - eGFR measurement  12/26/2024   OPHTHALMOLOGY EXAM  03/29/2025   Medicare Annual Wellness (AWV)  11/07/2025   Colonoscopy  05/16/2030   Meningococcal B Vaccine  Aged Out   DTaP/Tdap/Td  Discontinued        Assessment/Plan:  This is a routine wellness examination for Damonte.  Patient Care Team: Manya Toribio SQUIBB, PA as PCP - General (Physician Assistant) Jaye Fallow, MD as Referring Physician (Ophthalmology)  I have personally reviewed and noted the following in the patients chart:   Medical and social history Use of alcohol, tobacco or illicit drugs  Current medications and supplements including opioid prescriptions. Functional ability and status Nutritional status Physical activity Advanced directives List of other physicians Hospitalizations, surgeries, and ER visits in previous 12 months Vitals Screenings to  include cognitive, depression, and falls Referrals and appointments  No orders of the defined types were placed in this encounter.  In addition, I have reviewed and discussed with patient certain preventive protocols, quality metrics, and best practice recommendations. A written personalized care plan for preventive services as well as general preventive health recommendations were provided to patient.   Jhonnie GORMAN Das, LPN   [id number]   Return in 1 year (on 11/07/2025).  After Visit Summary: (MyChart) Due to this being a telephonic visit, the after visit summary with patients personalized plan was offered to patient via MyChart   Nurse Notes: NEEDS TDAP, SHINGRIX & FLU; UTD ON COLONOSCOPY

## 2024-12-14 ENCOUNTER — Ambulatory Visit: Admitting: Physician Assistant

## 2025-12-05 ENCOUNTER — Ambulatory Visit
